# Patient Record
Sex: Female | Born: 1985 | Race: White | Hispanic: No | Marital: Single | Smoking: Never smoker
Health system: Southern US, Community
[De-identification: ages and names within clinical notes are randomized; demographics above are authoritative.]

## PROBLEM LIST (undated history)

## (undated) DIAGNOSIS — F329 Major depressive disorder, single episode, unspecified: Secondary | ICD-10-CM

## (undated) DIAGNOSIS — F32A Depression, unspecified: Secondary | ICD-10-CM

## (undated) DIAGNOSIS — F419 Anxiety disorder, unspecified: Secondary | ICD-10-CM

## (undated) HISTORY — DX: Depression, unspecified: F32.A

## (undated) HISTORY — DX: Anxiety disorder, unspecified: F41.9

## (undated) HISTORY — PX: CHEST SURGERY: SHX595

---
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
## 2016-01-31 HISTORY — PX: CHEST SURGERY: SHX595

## 2018-01-07 DIAGNOSIS — F640 Transsexualism: Secondary | ICD-10-CM | POA: Diagnosis not present

## 2018-01-14 DIAGNOSIS — F640 Transsexualism: Secondary | ICD-10-CM | POA: Diagnosis not present

## 2018-01-15 DIAGNOSIS — F640 Transsexualism: Secondary | ICD-10-CM | POA: Diagnosis not present

## 2018-01-17 DIAGNOSIS — R102 Pelvic and perineal pain: Secondary | ICD-10-CM | POA: Diagnosis not present

## 2018-01-17 DIAGNOSIS — F329 Major depressive disorder, single episode, unspecified: Secondary | ICD-10-CM | POA: Diagnosis not present

## 2018-01-21 DIAGNOSIS — F640 Transsexualism: Secondary | ICD-10-CM | POA: Diagnosis not present

## 2018-01-30 DIAGNOSIS — R238 Other skin changes: Secondary | ICD-10-CM

## 2018-01-30 HISTORY — DX: Other skin changes: R23.8

## 2018-02-04 DIAGNOSIS — F640 Transsexualism: Secondary | ICD-10-CM | POA: Diagnosis not present

## 2018-02-18 DIAGNOSIS — F640 Transsexualism: Secondary | ICD-10-CM | POA: Diagnosis not present

## 2018-02-20 DIAGNOSIS — R109 Unspecified abdominal pain: Secondary | ICD-10-CM | POA: Diagnosis not present

## 2018-03-04 DIAGNOSIS — F640 Transsexualism: Secondary | ICD-10-CM | POA: Diagnosis not present

## 2018-03-04 DIAGNOSIS — R109 Unspecified abdominal pain: Secondary | ICD-10-CM | POA: Diagnosis not present

## 2018-03-11 DIAGNOSIS — J329 Chronic sinusitis, unspecified: Secondary | ICD-10-CM | POA: Diagnosis not present

## 2018-03-18 DIAGNOSIS — F640 Transsexualism: Secondary | ICD-10-CM | POA: Diagnosis not present

## 2018-04-01 DIAGNOSIS — F640 Transsexualism: Secondary | ICD-10-CM | POA: Diagnosis not present

## 2018-04-04 DIAGNOSIS — F341 Dysthymic disorder: Secondary | ICD-10-CM | POA: Diagnosis not present

## 2018-04-15 DIAGNOSIS — F640 Transsexualism: Secondary | ICD-10-CM | POA: Diagnosis not present

## 2018-04-29 DIAGNOSIS — F640 Transsexualism: Secondary | ICD-10-CM | POA: Diagnosis not present

## 2018-05-07 DIAGNOSIS — E559 Vitamin D deficiency, unspecified: Secondary | ICD-10-CM | POA: Diagnosis not present

## 2018-05-07 DIAGNOSIS — F341 Dysthymic disorder: Secondary | ICD-10-CM | POA: Diagnosis not present

## 2018-06-17 DIAGNOSIS — F640 Transsexualism: Secondary | ICD-10-CM | POA: Diagnosis not present

## 2018-07-01 DIAGNOSIS — F640 Transsexualism: Secondary | ICD-10-CM | POA: Diagnosis not present

## 2018-07-15 DIAGNOSIS — F640 Transsexualism: Secondary | ICD-10-CM | POA: Diagnosis not present

## 2018-07-17 DIAGNOSIS — E559 Vitamin D deficiency, unspecified: Secondary | ICD-10-CM | POA: Diagnosis not present

## 2018-07-17 DIAGNOSIS — F640 Transsexualism: Secondary | ICD-10-CM | POA: Diagnosis not present

## 2018-07-22 DIAGNOSIS — F640 Transsexualism: Secondary | ICD-10-CM | POA: Diagnosis not present

## 2018-07-22 DIAGNOSIS — E559 Vitamin D deficiency, unspecified: Secondary | ICD-10-CM | POA: Diagnosis not present

## 2018-07-29 DIAGNOSIS — F640 Transsexualism: Secondary | ICD-10-CM | POA: Diagnosis not present

## 2018-08-12 DIAGNOSIS — F640 Transsexualism: Secondary | ICD-10-CM | POA: Diagnosis not present

## 2018-08-16 DIAGNOSIS — F341 Dysthymic disorder: Secondary | ICD-10-CM | POA: Diagnosis not present

## 2018-08-16 DIAGNOSIS — E559 Vitamin D deficiency, unspecified: Secondary | ICD-10-CM | POA: Diagnosis not present

## 2018-08-26 DIAGNOSIS — F640 Transsexualism: Secondary | ICD-10-CM | POA: Diagnosis not present

## 2018-09-09 DIAGNOSIS — F640 Transsexualism: Secondary | ICD-10-CM | POA: Diagnosis not present

## 2018-09-09 DIAGNOSIS — Z1159 Encounter for screening for other viral diseases: Secondary | ICD-10-CM | POA: Diagnosis not present

## 2018-09-09 DIAGNOSIS — F329 Major depressive disorder, single episode, unspecified: Secondary | ICD-10-CM | POA: Diagnosis not present

## 2018-09-09 DIAGNOSIS — Z114 Encounter for screening for human immunodeficiency virus [HIV]: Secondary | ICD-10-CM | POA: Diagnosis not present

## 2018-09-23 DIAGNOSIS — F640 Transsexualism: Secondary | ICD-10-CM | POA: Diagnosis not present

## 2018-10-08 DIAGNOSIS — F640 Transsexualism: Secondary | ICD-10-CM | POA: Diagnosis not present

## 2019-06-11 ENCOUNTER — Other Ambulatory Visit: Payer: Self-pay

## 2019-06-11 ENCOUNTER — Encounter: Payer: Self-pay | Admitting: Osteopathic Medicine

## 2019-06-11 ENCOUNTER — Ambulatory Visit: Payer: BC Managed Care – PPO | Admitting: Osteopathic Medicine

## 2019-06-11 VITALS — BP 125/85 | HR 84 | Temp 98.7°F | Ht 65.0 in | Wt 208.1 lb

## 2019-06-11 DIAGNOSIS — Z23 Encounter for immunization: Secondary | ICD-10-CM | POA: Diagnosis not present

## 2019-06-11 DIAGNOSIS — F32A Depression, unspecified: Secondary | ICD-10-CM

## 2019-06-11 DIAGNOSIS — F419 Anxiety disorder, unspecified: Secondary | ICD-10-CM | POA: Diagnosis not present

## 2019-06-11 DIAGNOSIS — F640 Transsexualism: Secondary | ICD-10-CM | POA: Diagnosis not present

## 2019-06-11 DIAGNOSIS — F329 Major depressive disorder, single episode, unspecified: Secondary | ICD-10-CM

## 2019-06-11 DIAGNOSIS — IMO0001 Reserved for inherently not codable concepts without codable children: Secondary | ICD-10-CM

## 2019-06-11 DIAGNOSIS — Z79899 Other long term (current) drug therapy: Secondary | ICD-10-CM | POA: Diagnosis not present

## 2019-06-11 MED ORDER — TESTOSTERONE CYPIONATE 100 MG/ML IM SOLN: 100.0000 mg | INTRAMUSCULAR | 0 refills | Status: DC

## 2019-06-11 MED ORDER — TESTOSTERONE CYPIONATE 200 MG/ML IM SOLN
100.0000 mg | Freq: Once | INTRAMUSCULAR | Status: AC
  Administered 2019-06-11: 100 mg via INTRAMUSCULAR

## 2019-06-11 MED ORDER — SERTRALINE HCL 100 MG PO TABS: 100.0000 mg | ORAL_TABLET | Freq: Every day | ORAL | 0 refills | Status: DC

## 2019-06-11 NOTE — Progress Notes (Signed)
Victoria Hill is a 34 y.o. adult who presents to  [place] at [facility]  today, 06/11/19, seeking care for the following: . Establish care  . Transgender hormone therapy - see scanned docs for previous care  . Mental health - has been on fluoxetine 6-7 years, feels not as effective anymore. Would like to change. 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 A diagnosis of Need for Tdap vaccination was also pertinent to this visit.      Patient Instructions  Can arrange weekly or every other week testosterone injections here  Weekly - let's start w/ 100 mg per week Recheck labs in 3 months or so - monitor cholesterol, blood counts   Let's try switching fluoxetine to sertraline aka Zoloft  STOP fluoxetine On the following 2 days, can take 1/2 tablet sertraline (50 mg) THEN increase to whole tablet sertraline (100 mg)       Orders Placed This Encounter  Procedures  . Tdap vaccine greater than or equal to 7yo IM    Meds ordered this encounter  Medications  . testosterone cypionate (DEPOTESTOTERONE CYPIONATE) 100 MG/ML injection    Sig: Inject 1 mL (100 mg total) into the muscle once a week. For IM use only    Dispense:  10 mL    Refill:  0  . sertraline (ZOLOFT) 100 MG tablet    Sig: Take 1 tablet (100 mg total) by mouth daily.    Dispense:  90 tablet    Refill:  0  . testosterone cypionate (DEPOTESTOSTERONE CYPIONATE) injection 100 mg       Follow-up instructions: Return in about 3 months (around 09/11/2019) for [place] / TESTOSTERONE .                                         BP 125/85 (BP Location: Left Arm, Patient Position: Sitting, Cuff Size: Normal)   Pulse 84    Temp 98.7 F (37.1 C) (Oral)   Ht 5\' 5"  (1.651 m)   Wt 208 lb 1.9 oz (94.4 kg)   BMI 34.63 kg/m   Current Meds  Medication Sig  . Ascorbic Acid (VITAMIN C PO) Take 100 mg by mouth.  . Multiple Vitamins-Minerals (ZINC PO) Take 50 mg by mouth.  Marland Kitchen VITAMIN D PO Take 50 mg by mouth.  . [DISCONTINUED] FLUoxetine (PROZAC) 40 MG capsule Take 80 mg by mouth daily.    No results found for this or any previous visit (from the past 72 hour(s)).  No results found.  Depression screen PHQ 2/9 06/11/2019  Decreased Interest 1  Down, Depressed, Hopeless 1  PHQ - 2 Score 2  Altered sleeping 1  Tired, decreased energy 1  Change in appetite 1  Feeling bad or failure about yourself  0  Trouble concentrating 1  Moving slowly or fidgety/restless 0  Suicidal thoughts 0  PHQ-9 Score 6  Difficult doing work/chores Somewhat difficult    GAD 7 : Generalized Anxiety Score 06/11/2019  Nervous, Anxious, on Edge 0  Control/stop worrying 0  Worry too much - different things 0  Trouble relaxing 1  Restless 0  Easily annoyed or  irritable 0  Afraid - awful might happen 0  Total GAD 7 Score 1  Anxiety Difficulty Not difficult at all      All questions at time of visit were answered - patient instructed to contact office with any additional concerns or updates.  ER/RTC precautions were reviewed with the patient.  Please note: voice recognition software was used to produce this document, and typos may escape review. Please contact Dr. Sheppard Coil for any needed clarifications.   Total encounter time: 45 minutes.

## 2019-06-11 NOTE — Patient Instructions (Addendum)
Can arrange weekly or every other week testosterone injections here  Weekly - let's start w/ 100 mg per week Recheck labs in 3 months or so - monitor cholesterol, blood counts   Let's try switching fluoxetine to sertraline aka Zoloft  STOP fluoxetine On the following 2 days, can take 1/2 tablet sertraline (50 mg) THEN increase to whole tablet sertraline (100 mg)

## 2019-06-16 DIAGNOSIS — H52201 Unspecified astigmatism, right eye: Secondary | ICD-10-CM | POA: Diagnosis not present

## 2019-06-16 DIAGNOSIS — H0288A Meibomian gland dysfunction right eye, upper and lower eyelids: Secondary | ICD-10-CM | POA: Diagnosis not present

## 2019-06-16 DIAGNOSIS — H5212 Myopia, left eye: Secondary | ICD-10-CM | POA: Diagnosis not present

## 2019-06-16 DIAGNOSIS — H35412 Lattice degeneration of retina, left eye: Secondary | ICD-10-CM | POA: Diagnosis not present

## 2019-06-16 DIAGNOSIS — H0288B Meibomian gland dysfunction left eye, upper and lower eyelids: Secondary | ICD-10-CM | POA: Diagnosis not present

## 2019-06-16 DIAGNOSIS — Q120 Congenital cataract: Secondary | ICD-10-CM | POA: Diagnosis not present

## 2019-06-16 DIAGNOSIS — H5211 Myopia, right eye: Secondary | ICD-10-CM | POA: Diagnosis not present

## 2019-06-17 ENCOUNTER — Ambulatory Visit: Payer: BC Managed Care – PPO

## 2019-06-19 ENCOUNTER — Ambulatory Visit (INDEPENDENT_AMBULATORY_CARE_PROVIDER_SITE_OTHER): Payer: BC Managed Care – PPO | Admitting: Osteopathic Medicine

## 2019-06-19 VITALS — BP 116/68 | HR 86

## 2019-06-19 DIAGNOSIS — F640 Transsexualism: Secondary | ICD-10-CM

## 2019-06-19 DIAGNOSIS — IMO0001 Reserved for inherently not codable concepts without codable children: Secondary | ICD-10-CM

## 2019-06-19 DIAGNOSIS — Z79899 Other long term (current) drug therapy: Secondary | ICD-10-CM

## 2019-06-19 MED ORDER — TESTOSTERONE CYPIONATE 200 MG/ML IM SOLN
100.0000 mg | Freq: Once | INTRAMUSCULAR | Status: AC
  Administered 2019-06-19: 100 mg via INTRAMUSCULAR

## 2019-06-19 NOTE — Progress Notes (Signed)
Patient is here for testosterone injection. Denies chest pain, shortness of breath, headaches, and problems with medication or mood changes.   Patient tolerated testosterone 100 mg injection to RUOQ well without complications. Patient advised to schedule next injection in 7 days.

## 2019-06-27 ENCOUNTER — Ambulatory Visit (INDEPENDENT_AMBULATORY_CARE_PROVIDER_SITE_OTHER): Payer: BC Managed Care – PPO | Admitting: Osteopathic Medicine

## 2019-06-27 DIAGNOSIS — Z79899 Other long term (current) drug therapy: Secondary | ICD-10-CM

## 2019-06-27 DIAGNOSIS — F640 Transsexualism: Secondary | ICD-10-CM | POA: Diagnosis not present

## 2019-06-27 DIAGNOSIS — IMO0001 Reserved for inherently not codable concepts without codable children: Secondary | ICD-10-CM

## 2019-06-27 MED ORDER — TESTOSTERONE CYPIONATE 200 MG/ML IM SOLN
100.0000 mg | Freq: Once | INTRAMUSCULAR | Status: AC
  Administered 2019-06-27: 100 mg via INTRAMUSCULAR

## 2019-06-27 NOTE — Progress Notes (Signed)
   Subjective:    Patient ID: Victoria Hill, adult    DOB: 03-10-1985, 34 y.o.   MRN: [id number]  HPI Patient is here for a testosterone injection. Denies chest pain, shortness of breath, headaches, problems with mood change, or medication problems.    Review of Systems     Objective:   Physical Exam        Assessment & Plan:  Patient tolerated injection in LUOQ well without complications. Patient advised to schedule his next injection in 1 week.  HM: Patient has requested PAP be removed permanently from health maintenance. I believe I did this, but can you please verify for me?

## 2019-07-03 ENCOUNTER — Ambulatory Visit (INDEPENDENT_AMBULATORY_CARE_PROVIDER_SITE_OTHER): Payer: BC Managed Care – PPO | Admitting: Osteopathic Medicine

## 2019-07-03 ENCOUNTER — Other Ambulatory Visit: Payer: Self-pay

## 2019-07-03 VITALS — BP 116/69 | HR 80

## 2019-07-03 DIAGNOSIS — IMO0001 Reserved for inherently not codable concepts without codable children: Secondary | ICD-10-CM

## 2019-07-03 DIAGNOSIS — Z79899 Other long term (current) drug therapy: Secondary | ICD-10-CM | POA: Diagnosis not present

## 2019-07-03 DIAGNOSIS — F640 Transsexualism: Secondary | ICD-10-CM

## 2019-07-03 MED ORDER — TESTOSTERONE CYPIONATE 200 MG/ML IM SOLN
100.0000 mg | Freq: Once | INTRAMUSCULAR | Status: AC
  Administered 2019-07-03: 100 mg via INTRAMUSCULAR

## 2019-07-03 NOTE — Progress Notes (Signed)
Patient is here for testosterone injection. Denies chest pain, shortness of breath, headaches, and problems with medication or mood changes.   Patient tolerated testosterone 100 mg injection to RUOQ well without complications. Patient advised to schedule next injection in 7 days.

## 2019-07-10 ENCOUNTER — Other Ambulatory Visit: Payer: Self-pay

## 2019-07-10 ENCOUNTER — Ambulatory Visit (INDEPENDENT_AMBULATORY_CARE_PROVIDER_SITE_OTHER): Payer: BC Managed Care – PPO | Admitting: Family Medicine

## 2019-07-10 VITALS — BP 120/72 | HR 71 | Temp 98.0°F | Wt 214.0 lb

## 2019-07-10 DIAGNOSIS — Z79899 Other long term (current) drug therapy: Secondary | ICD-10-CM

## 2019-07-10 DIAGNOSIS — IMO0001 Reserved for inherently not codable concepts without codable children: Secondary | ICD-10-CM

## 2019-07-10 DIAGNOSIS — F640 Transsexualism: Secondary | ICD-10-CM | POA: Diagnosis not present

## 2019-07-10 MED ORDER — TESTOSTERONE CYPIONATE 200 MG/ML IM SOLN
100.0000 mg | INTRAMUSCULAR | Status: DC
  Administered 2019-07-10 – 2019-07-18 (×2): 100 mg via INTRAMUSCULAR

## 2019-07-10 NOTE — Progress Notes (Signed)
Pt here for testosterone injection no SOB,CP or mood swings. Injection tolerated well given in LUOQ. Pt to RTC in 1 week for next injection.

## 2019-07-10 NOTE — Progress Notes (Signed)
Medical screening examination/treatment was performed by qualified clinical staff member and as supervising physician I was immediately available for consultation/collaboration. I have reviewed documentation and agree with assessment and plan.  Arma Reining, DO  

## 2019-07-18 ENCOUNTER — Other Ambulatory Visit: Payer: Self-pay

## 2019-07-18 ENCOUNTER — Ambulatory Visit (INDEPENDENT_AMBULATORY_CARE_PROVIDER_SITE_OTHER): Payer: BC Managed Care – PPO | Admitting: Osteopathic Medicine

## 2019-07-18 VITALS — BP 109/56 | HR 71 | Wt 214.0 lb

## 2019-07-18 DIAGNOSIS — Z79899 Other long term (current) drug therapy: Secondary | ICD-10-CM

## 2019-07-18 DIAGNOSIS — F640 Transsexualism: Secondary | ICD-10-CM

## 2019-07-18 DIAGNOSIS — IMO0001 Reserved for inherently not codable concepts without codable children: Secondary | ICD-10-CM

## 2019-07-18 MED ORDER — TESTOSTERONE CYPIONATE 200 MG/ML IM SOLN: 100.0000 mg | Freq: Once | INTRAMUSCULAR | Status: DC

## 2019-07-18 NOTE — Progress Notes (Signed)
Pt here for testosterone injection no SOB,CP or mood swings. Injection tolerated well given in RUOQ. Pt to RTC in 1 week for next injection.

## 2019-07-24 ENCOUNTER — Ambulatory Visit (INDEPENDENT_AMBULATORY_CARE_PROVIDER_SITE_OTHER): Payer: BC Managed Care – PPO | Admitting: Osteopathic Medicine

## 2019-07-24 VITALS — BP 123/70 | HR 81 | Wt 215.0 lb

## 2019-07-24 DIAGNOSIS — Z79899 Other long term (current) drug therapy: Secondary | ICD-10-CM | POA: Diagnosis not present

## 2019-07-24 DIAGNOSIS — IMO0001 Reserved for inherently not codable concepts without codable children: Secondary | ICD-10-CM

## 2019-07-24 DIAGNOSIS — F640 Transsexualism: Secondary | ICD-10-CM

## 2019-07-24 MED ORDER — TESTOSTERONE CYPIONATE 200 MG/ML IM SOLN: 100.0000 mg | Freq: Once | INTRAMUSCULAR | 0 refills | Status: DC

## 2019-07-24 MED ORDER — TESTOSTERONE CYPIONATE 200 MG/ML IM SOLN
100.0000 mg | Freq: Once | INTRAMUSCULAR | Status: AC
Start: 2019-07-24 — End: 2019-07-24
  Administered 2019-07-24: 100 mg via INTRAMUSCULAR

## 2019-07-24 NOTE — Progress Notes (Signed)
Established Patient Office Visit  Subjective:  Patient ID: Victoria Hill, adult    DOB: 18-Jul-1985  Age: 34 y.o. MRN: [id number]  CC:  Chief Complaint  Patient presents with  . Hypogonadism    HPI Zeidy Tayag is here for a testosterone injection. Denies chest pain, shortness of breath, headaches or mood changes.   Past Medical History:  Diagnosis Date  . Anxiety   . Depression     Past Surgical History:  Procedure Laterality Date  . CHEST SURGERY      History reviewed. No pertinent family history.  Social History   Socioeconomic History  . Marital status: Soil scientist    Spouse name: Not on file  . Number of children: Not on file  . Years of education: Not on file  . Highest education level: Not on file  Occupational History  . Not on file  Tobacco Use  . Smoking status: Never Smoker  . Smokeless tobacco: Never Used  Vaping Use  . Vaping Use: Never used  Substance and Sexual Activity  . Alcohol use: Not Currently  . Drug use: Never  . Sexual activity: Never    Partners: Female  Other Topics Concern  . Not on file  Social History Narrative  . Not on file   Social Determinants of Health   Financial Resource Strain:   . Difficulty of Paying Living Expenses:   Food Insecurity:   . Worried About Charity fundraiser in the Last Year:   . Arboriculturist in the Last Year:   Transportation Needs:   . Film/video editor (Medical):   Marland Kitchen Lack of Transportation (Non-Medical):   Physical Activity:   . Days of Exercise per Week:   . Minutes of Exercise per Session:   Stress:   . Feeling of Stress :   Social Connections:   . Frequency of Communication with Friends and Family:   . Frequency of Social Gatherings with Friends and Family:   . Attends Religious Services:   . Active Member of Clubs or Organizations:   . Attends Archivist Meetings:   Marland Kitchen Marital Status:   Intimate Partner Violence:   . Fear of Current or Ex-Partner:   . Emotionally  Abused:   Marland Kitchen Physically Abused:   . Sexually Abused:     Outpatient Medications Prior to Visit  Medication Sig Dispense Refill  . Ascorbic Acid (VITAMIN C PO) Take 100 mg by mouth.    . Multiple Vitamins-Minerals (ZINC PO) Take 50 mg by mouth.    . sertraline (ZOLOFT) 100 MG tablet Take 1 tablet (100 mg total) by mouth daily. 90 tablet 0  . testosterone cypionate (DEPOTESTOTERONE CYPIONATE) 100 MG/ML injection Inject 1 mL (100 mg total) into the muscle once a week. For IM use only 10 mL 0  . VITAMIN D PO Take 50 mg by mouth.     Facility-Administered Medications Prior to Visit  Medication Dose Route Frequency Provider Last Rate Last Admin  . testosterone cypionate (DEPOTESTOSTERONE CYPIONATE) injection 100 mg  100 mg Intramuscular Weekly Luetta Nutting, DO   100 mg at 07/18/19 1048    No Known Allergies  ROS Review of Systems    Objective:    Physical Exam  BP 123/70   Pulse 81   Wt 215 lb (97.5 kg)   SpO2 100%   BMI 35.78 kg/m  Wt Readings from Last 3 Encounters:  07/24/19 215 lb (97.5 kg)  07/18/19 214 lb (97.1 kg)  07/10/19 214 lb (97.1 kg)     Health Maintenance Due  Topic Date Due  . Hepatitis C Screening  Never done  . HIV Screening  Never done    There are no preventive care reminders to display for this patient.  No results found for: TSH No results found for: WBC, HGB, HCT, MCV, PLT No results found for: NA, K, CHLORIDE, CO2, GLUCOSE, BUN, CREATININE, BILITOT, ALKPHOS, AST, ALT, PROT, ALBUMIN, CALCIUM, ANIONGAP, EGFR, GFR No results found for: CHOL No results found for: HDL No results found for: LDLCALC No results found for: TRIG No results found for: CHOLHDL No results found for: HGBA1C    Assessment & Plan:  Testosterone - Patient tolerated injection well without complications. Patient advised to schedule next injection 7 days from today.    Problem List Items Addressed This Visit    None    Visit Diagnoses    Female to female transsexual  person on hormone therapy    -  Primary   Relevant Medications   testosterone cypionate (DEPOTESTOSTERONE CYPIONATE) 200 MG/ML injection   testosterone cypionate (DEPOTESTOSTERONE CYPIONATE) injection 100 mg (Completed)      Meds ordered this encounter  Medications  . testosterone cypionate (DEPOTESTOSTERONE CYPIONATE) 200 MG/ML injection    Sig: Inject 0.5 mLs (100 mg total) into the muscle once for 1 dose.    Dispense:  10 mL    Refill:  0  . testosterone cypionate (DEPOTESTOSTERONE CYPIONATE) injection 100 mg    Follow-up: Return in about 1 week (around 07/31/2019) for testosteron injection.[name], [name], [place]

## 2019-07-31 ENCOUNTER — Other Ambulatory Visit: Payer: Self-pay

## 2019-07-31 ENCOUNTER — Ambulatory Visit (INDEPENDENT_AMBULATORY_CARE_PROVIDER_SITE_OTHER): Payer: BC Managed Care – PPO | Admitting: Osteopathic Medicine

## 2019-07-31 VITALS — BP 132/83 | HR 79

## 2019-07-31 DIAGNOSIS — F640 Transsexualism: Secondary | ICD-10-CM | POA: Diagnosis not present

## 2019-07-31 DIAGNOSIS — IMO0001 Reserved for inherently not codable concepts without codable children: Secondary | ICD-10-CM

## 2019-07-31 DIAGNOSIS — Z79899 Other long term (current) drug therapy: Secondary | ICD-10-CM

## 2019-07-31 MED ORDER — TESTOSTERONE CYPIONATE 200 MG/ML IM SOLN
100.0000 mg | Freq: Once | INTRAMUSCULAR | Status: AC
  Administered 2019-07-31: 100 mg via INTRAMUSCULAR

## 2019-07-31 NOTE — Progress Notes (Signed)
Patient is here for testosterone injection. Denies chest pain, shortness of breath, headaches, and problems with medication or mood changes.   Patient tolerated testosterone 100 mg injection to RUOQ well without complications. Patient advised to schedule next injection in 7 days.

## 2019-08-07 ENCOUNTER — Other Ambulatory Visit: Payer: Self-pay

## 2019-08-07 ENCOUNTER — Ambulatory Visit (INDEPENDENT_AMBULATORY_CARE_PROVIDER_SITE_OTHER): Payer: BC Managed Care – PPO | Admitting: Osteopathic Medicine

## 2019-08-07 VITALS — BP 130/77 | HR 80

## 2019-08-07 DIAGNOSIS — Z79899 Other long term (current) drug therapy: Secondary | ICD-10-CM | POA: Diagnosis not present

## 2019-08-07 DIAGNOSIS — IMO0001 Reserved for inherently not codable concepts without codable children: Secondary | ICD-10-CM

## 2019-08-07 DIAGNOSIS — F640 Transsexualism: Secondary | ICD-10-CM

## 2019-08-07 MED ORDER — TESTOSTERONE CYPIONATE 200 MG/ML IM SOLN
100.0000 mg | Freq: Once | INTRAMUSCULAR | Status: AC
  Administered 2019-08-07: 100 mg via INTRAMUSCULAR

## 2019-08-07 NOTE — Progress Notes (Signed)
Patient is here for testosterone injection. Denies chest pain, shortness of breath, headaches, and problems with medication or mood changes.   Patient tolerated testosterone 100 mg injection to RUOQ well without complications. Patient advised to schedule next injection in 1 week.

## 2019-08-14 ENCOUNTER — Ambulatory Visit (INDEPENDENT_AMBULATORY_CARE_PROVIDER_SITE_OTHER): Payer: BC Managed Care – PPO | Admitting: Medical-Surgical

## 2019-08-14 VITALS — BP 118/59 | HR 75 | Temp 97.8°F | Wt 215.8 lb

## 2019-08-14 DIAGNOSIS — Z79899 Other long term (current) drug therapy: Secondary | ICD-10-CM

## 2019-08-14 DIAGNOSIS — F640 Transsexualism: Secondary | ICD-10-CM | POA: Diagnosis not present

## 2019-08-14 DIAGNOSIS — IMO0001 Reserved for inherently not codable concepts without codable children: Secondary | ICD-10-CM

## 2019-08-14 MED ORDER — TESTOSTERONE CYPIONATE 200 MG/ML IM SOLN
100.0000 mg | INTRAMUSCULAR | Status: AC
  Administered 2019-08-14: 100 mg via INTRAMUSCULAR

## 2019-08-14 NOTE — Progress Notes (Signed)
Patient is here for a testosterone injection of 151ml.  Given in [place]  Denies chest pain, shortn   s of breath, headaches and problems with medication or mood changes.  Tolerated injection well without complications.   Patient advised to schedule next injection in 7 days.

## 2019-08-21 ENCOUNTER — Other Ambulatory Visit: Payer: Self-pay

## 2019-08-21 ENCOUNTER — Ambulatory Visit (INDEPENDENT_AMBULATORY_CARE_PROVIDER_SITE_OTHER): Payer: BC Managed Care – PPO | Admitting: Osteopathic Medicine

## 2019-08-21 VITALS — BP 105/47 | HR 84

## 2019-08-21 DIAGNOSIS — IMO0001 Reserved for inherently not codable concepts without codable children: Secondary | ICD-10-CM

## 2019-08-21 DIAGNOSIS — F640 Transsexualism: Secondary | ICD-10-CM

## 2019-08-21 DIAGNOSIS — Z79899 Other long term (current) drug therapy: Secondary | ICD-10-CM | POA: Diagnosis not present

## 2019-08-21 MED ORDER — TESTOSTERONE CYPIONATE 200 MG/ML IM SOLN
100.0000 mg | Freq: Once | INTRAMUSCULAR | Status: AC
  Administered 2019-08-21: 100 mg via INTRAMUSCULAR

## 2019-08-21 NOTE — Progress Notes (Signed)
Patient is here for testosterone injection. Denies chest pain, shortness of breath, headaches, and problems with medication or mood changes.   Patient tolerated testosterone 100 mg injection to RUOQ well without complications. Patient advised to schedule next injection in 7 days.

## 2019-08-28 ENCOUNTER — Ambulatory Visit (INDEPENDENT_AMBULATORY_CARE_PROVIDER_SITE_OTHER): Payer: BC Managed Care – PPO | Admitting: Osteopathic Medicine

## 2019-08-28 VITALS — BP 114/70 | HR 70 | Temp 97.9°F | Wt 218.0 lb

## 2019-08-28 DIAGNOSIS — F640 Transsexualism: Secondary | ICD-10-CM

## 2019-08-28 DIAGNOSIS — IMO0001 Reserved for inherently not codable concepts without codable children: Secondary | ICD-10-CM

## 2019-08-28 DIAGNOSIS — Z79899 Other long term (current) drug therapy: Secondary | ICD-10-CM | POA: Diagnosis not present

## 2019-08-28 MED ORDER — TESTOSTERONE CYPIONATE 200 MG/ML IM SOLN
100.0000 mg | Freq: Once | INTRAMUSCULAR | Status: AC
  Administered 2019-08-28: 100 mg via INTRAMUSCULAR

## 2019-08-28 NOTE — Progress Notes (Signed)
Pt is here for a testosterone injection. Denies chest pains, shortness of breath, headaches and problems with medications or mood changes. Pt tolerated 100 mg injection well on LUOQ without any complications. Pt advised to schedule next injection in 7 days.

## 2019-08-31 ENCOUNTER — Other Ambulatory Visit: Payer: Self-pay | Admitting: Osteopathic Medicine

## 2019-08-31 DIAGNOSIS — F329 Major depressive disorder, single episode, unspecified: Secondary | ICD-10-CM

## 2019-08-31 DIAGNOSIS — F419 Anxiety disorder, unspecified: Secondary | ICD-10-CM

## 2019-08-31 DIAGNOSIS — F32A Depression, unspecified: Secondary | ICD-10-CM

## 2019-09-01 MED ORDER — SERTRALINE HCL 100 MG PO TABS: 100.0000 mg | ORAL_TABLET | Freq: Every day | ORAL | 0 refills | Status: DC

## 2019-09-01 NOTE — Telephone Encounter (Signed)
Last Ov- 06/11/2019  Last refill 06/11/2019

## 2019-09-04 ENCOUNTER — Ambulatory Visit: Payer: BC Managed Care – PPO

## 2019-09-04 ENCOUNTER — Other Ambulatory Visit: Payer: Self-pay

## 2019-09-04 ENCOUNTER — Ambulatory Visit (INDEPENDENT_AMBULATORY_CARE_PROVIDER_SITE_OTHER): Payer: BC Managed Care – PPO | Admitting: Osteopathic Medicine

## 2019-09-04 VITALS — BP 113/54 | HR 72

## 2019-09-04 DIAGNOSIS — Z79899 Other long term (current) drug therapy: Secondary | ICD-10-CM | POA: Diagnosis not present

## 2019-09-04 DIAGNOSIS — F640 Transsexualism: Secondary | ICD-10-CM

## 2019-09-04 DIAGNOSIS — IMO0001 Reserved for inherently not codable concepts without codable children: Secondary | ICD-10-CM

## 2019-09-04 MED ORDER — TESTOSTERONE CYPIONATE 200 MG/ML IM SOLN
100.0000 mg | Freq: Once | INTRAMUSCULAR | Status: AC
  Administered 2019-09-04: 100 mg via INTRAMUSCULAR

## 2019-09-04 NOTE — Progress Notes (Signed)
Patient is here for testosterone injection. Denies chest pain, shortness of breath, headaches, and problems with medication or mood changes.   Patient tolerated testosterone 100 mg injection to RUOQ well without complications. Patient advised to schedule next injection in 7 days.

## 2019-09-11 ENCOUNTER — Other Ambulatory Visit: Payer: Self-pay

## 2019-09-11 ENCOUNTER — Encounter: Payer: Self-pay | Admitting: Osteopathic Medicine

## 2019-09-11 ENCOUNTER — Ambulatory Visit (INDEPENDENT_AMBULATORY_CARE_PROVIDER_SITE_OTHER): Payer: BC Managed Care – PPO | Admitting: Osteopathic Medicine

## 2019-09-11 VITALS — BP 101/73 | HR 75 | Wt 219.0 lb

## 2019-09-11 DIAGNOSIS — F640 Transsexualism: Secondary | ICD-10-CM | POA: Insufficient documentation

## 2019-09-11 DIAGNOSIS — IMO0001 Reserved for inherently not codable concepts without codable children: Secondary | ICD-10-CM

## 2019-09-11 DIAGNOSIS — Z79899 Other long term (current) drug therapy: Secondary | ICD-10-CM | POA: Diagnosis not present

## 2019-09-11 DIAGNOSIS — Z789 Other specified health status: Secondary | ICD-10-CM | POA: Insufficient documentation

## 2019-09-11 LAB — COMPLETE METABOLIC PANEL WITH GFR
AG Ratio: 1.9 (calc) (ref 1.0–2.5)
ALT: 21 U/L (ref 6–29)
AST: 15 U/L (ref 10–30)
Albumin: 4.4 g/dL (ref 3.6–5.1)
Alkaline phosphatase (APISO): 46 U/L (ref 31–125)
BUN: 9 mg/dL (ref 7–25)
CO2: 33 mmol/L — ABNORMAL HIGH (ref 20–32)
Calcium: 9.2 mg/dL (ref 8.6–10.2)
Chloride: 103 mmol/L (ref 98–110)
Creat: 0.86 mg/dL (ref 0.50–1.10)
GFR, Est African American: 101 mL/min/{1.73_m2} (ref >=60)
GFR, Est Non African American: 87 mL/min/{1.73_m2} (ref >=60)
Globulin: 2.3 g/dL (calc) (ref 1.9–3.7)
Glucose, Bld: 87 mg/dL (ref 65–99)
Potassium: 4.1 mmol/L (ref 3.5–5.3)
Sodium: 142 mmol/L (ref 135–146)
Total Bilirubin: 0.8 mg/dL (ref 0.2–1.2)
Total Protein: 6.7 g/dL (ref 6.1–8.1)

## 2019-09-11 LAB — CBC
HCT: 44.6 % (ref 35.0–45.0)
Hemoglobin: 15 g/dL (ref 11.7–15.5)
MCH: 29.7 pg (ref 27.0–33.0)
MCHC: 33.6 g/dL (ref 32.0–36.0)
MCV: 88.3 fL (ref 80.0–100.0)
MPV: 10.5 fL (ref 7.5–12.5)
Platelets: 350 10*3/uL (ref 140–400)
RBC: 5.05 10*6/uL (ref 3.80–5.10)
RDW: 12.5 % (ref 11.0–15.0)
WBC: 6.1 10*3/uL (ref 3.8–10.8)

## 2019-09-11 MED ORDER — TESTOSTERONE CYPIONATE 200 MG/ML IM SOLN: 100.0000 mg | Freq: Once | INTRAMUSCULAR | Status: DC

## 2019-09-11 MED ORDER — BUPROPION HCL ER (XL) 150 MG PO TB24
150.0000 mg | ORAL_TABLET | ORAL | 0 refills | Status: DC
Start: 2019-09-11 — End: 2019-12-15

## 2019-09-11 MED ORDER — SERTRALINE HCL 50 MG PO TABS
ORAL_TABLET | ORAL | 0 refills | Status: DC
Start: 2019-09-11 — End: 2019-10-15

## 2019-09-11 NOTE — Patient Instructions (Signed)
Plan: Will start Wellbutrin/buproprion now / tomorrow morning Taper off the Zoloft/sertaline as directed  If Wellbutrin working well, no problems! If not or if side effects, will transition to Trintellix or Viibryd but these may require an authorization process

## 2019-09-11 NOTE — Progress Notes (Signed)
Patient is here for testosterone injection. Denies chest pain, shortness of breath, headaches, and problems with medication or mood changes.   Patient tolerated testosterone 100 mg injection to LUOQ well without complications. Patient advised to schedule next injection in 7 days.   

## 2019-09-11 NOTE — Progress Notes (Signed)
Victoria Hill is a 34 y.o. adult who presents to  [place] at [facility]  today, 09/11/19, seeking care for the following:  . Recheck mental health -previous visit we switched from fluoxetine to sertraline.  Sertraline seems to be working fairly well but he has noticed some concentration issues/short-term memory problems. . Due for T injection -happy with current dose of testosterone, feeling a lot better on this medication    Depression screen [facility] 2/9 09/11/2019 06/11/2019  Decreased Interest 0 1  Down, Depressed, Hopeless 1 1  PHQ - 2 Score 1 2  Altered sleeping - 1  Tired, decreased energy - 1  Change in appetite - 1  Feeling bad or failure about yourself  - 0  Trouble concentrating - 1  Moving slowly or fidgety/restless - 0  Suicidal thoughts - 0  PHQ-9 Score - 6  Difficult doing work/chores - Somewhat difficult   GAD 7 : Generalized Anxiety Score 06/11/2019  Nervous, Anxious, on Edge 0  Control/stop worrying 0  Worry too much - different things 0  Trouble relaxing 1  Restless 0  Easily annoyed or irritable 0  Afraid - awful might happen 0  Total GAD 7 Score 1  Anxiety Difficulty Not difficult at all       ASSESSMENT & PLAN with other pertinent findings:  The primary encounter diagnosis was Transgender. 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   Patient Instructions  Plan: Will start Wellbutrin/buproprion now / tomorrow morning Taper off the Zoloft/sertaline as directed  If Wellbutrin working well, no problems! If not or if side effects, will transition to Trintellix or Viibryd but these may require an authorization process     Orders Placed This Encounter  Procedures  . CBC  . COMPLETE METABOLIC PANEL WITH GFR    Meds ordered this encounter  Medications  . testosterone cypionate (DEPOTESTOSTERONE CYPIONATE) injection 100 mg       Follow-up instructions: Return in about 1 week  (around 09/18/2019) for testosterone shot. See Dr Sheppard Coil in 3 month to recheck mental health .                                         BP 101/73 (BP Location: Right Arm, Patient Position: Sitting)   Pulse 75   Wt 219 lb (99.3 kg)   SpO2 99%   BMI 36.44 kg/m   No outpatient medications have been marked as taking for the 09/11/19 encounter (Office Visit) with Emeterio Reeve, DO.   Current Facility-Administered Medications for the 09/11/19 encounter (Office Visit) with Emeterio Reeve, DO  Medication  . testosterone cypionate (DEPOTESTOSTERONE CYPIONATE) injection 100 mg    No results found for this or any previous visit (from the past 1 hour(s)).  No results found.     All questions at time of visit were answered - patient instructed to contact office with any additional concerns or updates.  ER/RTC precautions were reviewed with the patient as applicable.   Please note: voice recognition software was used to produce this document, and typos may escape review. Please contact Dr. Sheppard Coil for any needed clarifications.

## 2019-09-19 ENCOUNTER — Other Ambulatory Visit: Payer: Self-pay

## 2019-09-19 ENCOUNTER — Ambulatory Visit (INDEPENDENT_AMBULATORY_CARE_PROVIDER_SITE_OTHER): Payer: BC Managed Care – PPO | Admitting: Osteopathic Medicine

## 2019-09-19 VITALS — BP 120/74 | HR 68 | Wt 218.0 lb

## 2019-09-19 DIAGNOSIS — F640 Transsexualism: Secondary | ICD-10-CM

## 2019-09-19 DIAGNOSIS — Z79899 Other long term (current) drug therapy: Secondary | ICD-10-CM | POA: Diagnosis not present

## 2019-09-19 DIAGNOSIS — IMO0001 Reserved for inherently not codable concepts without codable children: Secondary | ICD-10-CM

## 2019-09-19 MED ORDER — TESTOSTERONE CYPIONATE 200 MG/ML IM SOLN
100.0000 mg | Freq: Once | INTRAMUSCULAR | Status: AC
Start: 2019-09-19 — End: 2019-09-19
  Administered 2019-09-19: 100 mg via INTRAMUSCULAR

## 2019-09-19 NOTE — Progress Notes (Signed)
Established Patient Office Visit  Subjective:  Patient ID: Victoria Hill, adult    DOB: [birth year]  Age: 34 y.o. MRN: [id number]  CC:  Chief Complaint  Patient presents with  . Injections    HPI Victoria Hill is here for a testosterone injection. Denies chest pain, shortness of breath, headaches or mood changes.   Past Medical History:  Diagnosis Date  . Anxiety   . Depression     Past Surgical History:  Procedure Laterality Date  . CHEST SURGERY      History reviewed. No pertinent family history.  Social History   Socioeconomic History  . Marital status: Soil scientist    Spouse name: Not on file  . Number of children: Not on file  . Years of education: Not on file  . Highest education level: Not on file  Occupational History  . Not on file  Tobacco Use  . Smoking status: Never Smoker  . Smokeless tobacco: Never Used  Vaping Use  . Vaping Use: Never used  Substance and Sexual Activity  . Alcohol use: Not Currently  . Drug use: Never  . Sexual activity: Never    Partners: Female  Other Topics Concern  . Not on file  Social History Narrative  . Not on file   Social Determinants of Health   Financial Resource Strain:   . Difficulty of Paying Living Expenses: Not on file  Food Insecurity:   . Worried About Charity fundraiser in the Last Year: Not on file  . Ran Out of Food in the Last Year: Not on file  Transportation Needs:   . Lack of Transportation (Medical): Not on file  . Lack of Transportation (Non-Medical): Not on file  Physical Activity:   . Days of Exercise per Week: Not on file  . Minutes of Exercise per Session: Not on file  Stress:   . Feeling of Stress : Not on file  Social Connections:   . Frequency of Communication with Friends and Family: Not on file  . Frequency of Social Gatherings with Friends and Family: Not on file  . Attends Religious Services: Not on file  . Active Member of Clubs or Organizations: Not on file  . Attends English as a second language teacher Meetings: Not on file  . Marital Status: Not on file  Intimate Partner Violence:   . Fear of Current or Ex-Partner: Not on file  . Emotionally Abused: Not on file  . Physically Abused: Not on file  . Sexually Abused: Not on file    Outpatient Medications Prior to Visit  Medication Sig Dispense Refill  . Ascorbic Acid (VITAMIN C PO) Take 100 mg by mouth.    Marland Kitchen buPROPion (WELLBUTRIN XL) 150 MG 24 hr tablet Take 1 tablet (150 mg total) by mouth every morning. 90 tablet 0  . Multiple Vitamins-Minerals (ZINC PO) Take 50 mg by mouth.    . sertraline (ZOLOFT) 50 MG tablet Take 1 tablet (50 mg total) by mouth daily for 5 days, THEN 0.5 tablets (25 mg total) daily for 5 days. 10 tablet 0  . testosterone cypionate (DEPOTESTOTERONE CYPIONATE) 100 MG/ML injection Inject 100 mg into the muscle every 7 (seven) days. For IM use only    . VITAMIN D PO Take 50 mg by mouth.    . testosterone cypionate (DEPOTESTOSTERONE CYPIONATE) injection 100 mg      No facility-administered medications prior to visit.    No Known Allergies  ROS Review of Systems  Objective:    Physical Exam  BP 120/74   Pulse 68   Wt 218 lb (98.9 kg)   SpO2 100%   BMI 36.28 kg/m  Wt Readings from Last 3 Encounters:  09/19/19 218 lb (98.9 kg)  09/11/19 219 lb (99.3 kg)  08/28/19 (!) 218 lb (98.9 kg)     Health Maintenance Due  Topic Date Due  . Hepatitis C Screening  Never done  . HIV Screening  Never done  . INFLUENZA VACCINE  08/31/2019    There are no preventive care reminders to display for this patient.  No results found for: TSH Lab Results  Component Value Date   WBC 6.1 09/11/2019   HGB 15.0 09/11/2019   HCT 44.6 09/11/2019   MCV 88.3 09/11/2019   PLT 350 09/11/2019   Lab Results  Component Value Date   NA 142 09/11/2019   K 4.1 09/11/2019   CO2 33 (H) 09/11/2019   GLUCOSE 87 09/11/2019   BUN 9 09/11/2019   CREATININE 0.86 09/11/2019   BILITOT 0.8 09/11/2019   AST 15  09/11/2019   ALT 21 09/11/2019   PROT 6.7 09/11/2019   CALCIUM 9.2 09/11/2019   No results found for: CHOL No results found for: HDL No results found for: LDLCALC No results found for: TRIG No results found for: CHOLHDL No results found for: HGBA1C    Assessment & Plan:  Transgender - Patient tolerated injection well without complications. Patient advised to schedule next injection 7 days from today.    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 testosterone cypionate (DEPOTESTOSTERONE CYPIONATE) injection 100 mg    Follow-up: Return in about 1 week (around 09/26/2019) for testosterone injection. [name], [name], [place]

## 2019-09-25 ENCOUNTER — Ambulatory Visit (INDEPENDENT_AMBULATORY_CARE_PROVIDER_SITE_OTHER): Payer: BC Managed Care – PPO | Admitting: Family Medicine

## 2019-09-25 VITALS — BP 115/63 | HR 73

## 2019-09-25 DIAGNOSIS — IMO0001 Reserved for inherently not codable concepts without codable children: Secondary | ICD-10-CM

## 2019-09-25 DIAGNOSIS — F640 Transsexualism: Secondary | ICD-10-CM

## 2019-09-25 DIAGNOSIS — E291 Testicular hypofunction: Secondary | ICD-10-CM

## 2019-09-25 MED ORDER — TESTOSTERONE CYPIONATE 200 MG/ML IM SOLN
100.0000 mg | Freq: Once | INTRAMUSCULAR | Status: AC
Start: 2019-09-25 — End: 2019-09-25
  Administered 2019-09-25: 100 mg via INTRAMUSCULAR

## 2019-09-25 MED ORDER — TESTOSTERONE CYPIONATE 200 MG/ML IM SOLN: 100.0000 mg | Freq: Once | INTRAMUSCULAR | Status: DC

## 2019-09-25 NOTE — Progress Notes (Signed)
Patient is here for testosterone injection. Denies chest pain, shortness of breath, headaches, and problems with medication or mood changes.   Patient tolerated testosterone 100 mg injection to LUOQ well without complications. Patient advised to schedule next injection in 7 days.   

## 2019-09-25 NOTE — Progress Notes (Signed)
Medical screening examination/treatment was performed by qualified clinical staff member and as supervising physician I was immediately available for consultation/collaboration. I have reviewed documentation and agree with assessment and plan.  Tiasia [place], DO  

## 2019-10-02 ENCOUNTER — Ambulatory Visit (INDEPENDENT_AMBULATORY_CARE_PROVIDER_SITE_OTHER): Payer: BC Managed Care – PPO | Admitting: Osteopathic Medicine

## 2019-10-02 VITALS — BP 117/72 | HR 78 | Wt 217.0 lb

## 2019-10-02 DIAGNOSIS — Z79899 Other long term (current) drug therapy: Secondary | ICD-10-CM

## 2019-10-02 DIAGNOSIS — F640 Transsexualism: Secondary | ICD-10-CM

## 2019-10-02 DIAGNOSIS — IMO0001 Reserved for inherently not codable concepts without codable children: Secondary | ICD-10-CM

## 2019-10-02 MED ORDER — TESTOSTERONE CYPIONATE 200 MG/ML IM SOLN
100.0000 mg | Freq: Once | INTRAMUSCULAR | Status: AC
Start: 2019-10-02 — End: 2019-10-02
  Administered 2019-10-02: 100 mg via INTRAMUSCULAR

## 2019-10-02 NOTE — Progress Notes (Signed)
Established Patient Office Visit  Subjective:  Patient ID: Victoria Hill, adult    DOB: May 18, 1985  Age: 34 y.o. MRN: [id number]  CC:  Chief Complaint  Patient presents with  . Injections    HPI Victoria Hill is here for a testosterone injection. Denies chest pain, shortness of breath, headaches or mood changes.   Past Medical History:  Diagnosis Date  . Anxiety   . Depression     Past Surgical History:  Procedure Laterality Date  . CHEST SURGERY      History reviewed. No pertinent family history.  Social History   Socioeconomic History  . Marital status: Soil scientist    Spouse name: Not on file  . Number of children: Not on file  . Years of education: Not on file  . Highest education level: Not on file  Occupational History  . Not on file  Tobacco Use  . Smoking status: Never Smoker  . Smokeless tobacco: Never Used  Vaping Use  . Vaping Use: Never used  Substance and Sexual Activity  . Alcohol use: Not Currently  . Drug use: Never  . Sexual activity: Never    Partners: Female  Other Topics Concern  . Not on file  Social History Narrative  . Not on file   Social Determinants of Health   Financial Resource Strain:   . Difficulty of Paying Living Expenses: Not on file  Food Insecurity:   . Worried About Charity fundraiser in the Last Year: Not on file  . Ran Out of Food in the Last Year: Not on file  Transportation Needs:   . Lack of Transportation (Medical): Not on file  . Lack of Transportation (Non-Medical): Not on file  Physical Activity:   . Days of Exercise per Week: Not on file  . Minutes of Exercise per Session: Not on file  Stress:   . Feeling of Stress : Not on file  Social Connections:   . Frequency of Communication with Friends and Family: Not on file  . Frequency of Social Gatherings with Friends and Family: Not on file  . Attends Religious Services: Not on file  . Active Member of Clubs or Organizations: Not on file  . Attends English as a second language teacher Meetings: Not on file  . Marital Status: Not on file  Intimate Partner Violence:   . Fear of Current or Ex-Partner: Not on file  . Emotionally Abused: Not on file  . Physically Abused: Not on file  . Sexually Abused: Not on file    Outpatient Medications Prior to Visit  Medication Sig Dispense Refill  . Ascorbic Acid (VITAMIN C PO) Take 100 mg by mouth.    Marland Kitchen buPROPion (WELLBUTRIN XL) 150 MG 24 hr tablet Take 1 tablet (150 mg total) by mouth every morning. 90 tablet 0  . Multiple Vitamins-Minerals (ZINC PO) Take 50 mg by mouth.    . testosterone cypionate (DEPOTESTOTERONE CYPIONATE) 100 MG/ML injection Inject 100 mg into the muscle every 7 (seven) days. For IM use only    . VITAMIN D PO Take 50 mg by mouth.    . sertraline (ZOLOFT) 50 MG tablet Take 1 tablet (50 mg total) by mouth daily for 5 days, THEN 0.5 tablets (25 mg total) daily for 5 days. 10 tablet 0   No facility-administered medications prior to visit.    No Known Allergies  ROS Review of Systems    Objective:    Physical Exam  BP 117/72   Pulse  78   Wt 217 lb (98.4 kg)   SpO2 100%   BMI 36.11 kg/m  Wt Readings from Last 3 Encounters:  10/02/19 217 lb (98.4 kg)  09/19/19 218 lb (98.9 kg)  09/11/19 219 lb (99.3 kg)     Health Maintenance Due  Topic Date Due  . Hepatitis C Screening  Never done  . HIV Screening  Never done    There are no preventive care reminders to display for this patient.  No results found for: TSH Lab Results  Component Value Date   WBC 6.1 09/11/2019   HGB 15.0 09/11/2019   HCT 44.6 09/11/2019   MCV 88.3 09/11/2019   PLT 350 09/11/2019   Lab Results  Component Value Date   NA 142 09/11/2019   K 4.1 09/11/2019   CO2 33 (H) 09/11/2019   GLUCOSE 87 09/11/2019   BUN 9 09/11/2019   CREATININE 0.86 09/11/2019   BILITOT 0.8 09/11/2019   AST 15 09/11/2019   ALT 21 09/11/2019   PROT 6.7 09/11/2019   CALCIUM 9.2 09/11/2019   No results found for: CHOL No  results found for: HDL No results found for: LDLCALC No results found for: TRIG No results found for: CHOLHDL No results found for: HGBA1C    Assessment & Plan:  Injection - Patient tolerated injection well without complications. Patient advised to schedule next injection 7 days from today.    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 testosterone cypionate (DEPOTESTOSTERONE CYPIONATE) injection 100 mg    Follow-up: Return in about 1 week (around 10/09/2019) for testosterone injection. [name], [name], [place]

## 2019-10-08 ENCOUNTER — Ambulatory Visit (INDEPENDENT_AMBULATORY_CARE_PROVIDER_SITE_OTHER): Payer: BC Managed Care – PPO | Admitting: Osteopathic Medicine

## 2019-10-08 ENCOUNTER — Other Ambulatory Visit: Payer: Self-pay

## 2019-10-08 VITALS — BP 107/68 | HR 77 | Wt 217.0 lb

## 2019-10-08 DIAGNOSIS — IMO0001 Reserved for inherently not codable concepts without codable children: Secondary | ICD-10-CM

## 2019-10-08 DIAGNOSIS — Z79899 Other long term (current) drug therapy: Secondary | ICD-10-CM | POA: Diagnosis not present

## 2019-10-08 DIAGNOSIS — F640 Transsexualism: Secondary | ICD-10-CM | POA: Diagnosis not present

## 2019-10-08 MED ORDER — TESTOSTERONE CYPIONATE 200 MG/ML IM SOLN: 200.0000 mg | Freq: Once | INTRAMUSCULAR | Status: DC

## 2019-10-08 MED ORDER — TESTOSTERONE CYPIONATE 200 MG/ML IM SOLN
100.0000 mg | Freq: Once | INTRAMUSCULAR | Status: AC
  Administered 2019-10-08: 100 mg via INTRAMUSCULAR

## 2019-10-08 NOTE — Progress Notes (Signed)
   Subjective:    Patient ID: Victoria Hill, adult    DOB: 07-13-1985, 34 y.o.   MRN: [id number]  HPI Patient is here for a testosterone injection. Denies chest pain, shortness of breath, headaches, problems with mood change, or medication problems.    Review of Systems     Objective:   Physical Exam        Assessment & Plan:  Patient tolerated injection of 100 mg in LUOQ well without complications. Patient advised to schedule his next injection for 1 week from today.

## 2019-10-15 ENCOUNTER — Other Ambulatory Visit: Payer: Self-pay

## 2019-10-15 ENCOUNTER — Ambulatory Visit (INDEPENDENT_AMBULATORY_CARE_PROVIDER_SITE_OTHER): Payer: BC Managed Care – PPO | Admitting: Osteopathic Medicine

## 2019-10-15 VITALS — BP 120/64 | HR 78 | Wt 217.0 lb

## 2019-10-15 DIAGNOSIS — F640 Transsexualism: Secondary | ICD-10-CM

## 2019-10-15 DIAGNOSIS — Z01812 Encounter for preprocedural laboratory examination: Secondary | ICD-10-CM

## 2019-10-15 DIAGNOSIS — E291 Testicular hypofunction: Secondary | ICD-10-CM

## 2019-10-15 MED ORDER — TESTOSTERONE CYPIONATE 200 MG/ML IM SOLN
100.0000 mg | Freq: Once | INTRAMUSCULAR | Status: AC
  Administered 2019-10-15: 100 mg via INTRAMUSCULAR

## 2019-10-15 NOTE — Progress Notes (Signed)
Pt here for testosterone injection no SOB,CP or mood swings. Injection tolerated well 100 mg given in RUOQ. Pt to RTC in 1 wk for next injection.

## 2019-10-23 ENCOUNTER — Ambulatory Visit (INDEPENDENT_AMBULATORY_CARE_PROVIDER_SITE_OTHER): Payer: BC Managed Care – PPO | Admitting: Osteopathic Medicine

## 2019-10-23 ENCOUNTER — Other Ambulatory Visit: Payer: Self-pay

## 2019-10-23 VITALS — BP 130/67 | HR 76 | Temp 98.1°F | Wt 217.1 lb

## 2019-10-23 DIAGNOSIS — Z79899 Other long term (current) drug therapy: Secondary | ICD-10-CM

## 2019-10-23 DIAGNOSIS — IMO0001 Reserved for inherently not codable concepts without codable children: Secondary | ICD-10-CM

## 2019-10-23 DIAGNOSIS — F640 Transsexualism: Secondary | ICD-10-CM

## 2019-10-23 MED ORDER — TESTOSTERONE CYPIONATE 200 MG/ML IM SOLN
100.0000 mg | Freq: Once | INTRAMUSCULAR | Status: AC
  Administered 2019-10-23: 100 mg via INTRAMUSCULAR

## 2019-10-23 NOTE — Progress Notes (Signed)
Patient is here for a testosterone injection. Denies chest pain, shortness of breath, headaches, and problems with medication or mood changes.   Patient tolerated injection well on LUOQ without any complications. Patient advised to schedule next injection in 7 days.

## 2019-10-29 ENCOUNTER — Ambulatory Visit: Payer: BC Managed Care – PPO

## 2019-10-30 ENCOUNTER — Ambulatory Visit (INDEPENDENT_AMBULATORY_CARE_PROVIDER_SITE_OTHER): Payer: BC Managed Care – PPO | Admitting: Family Medicine

## 2019-10-30 VITALS — BP 112/68 | HR 84 | Wt 216.0 lb

## 2019-10-30 DIAGNOSIS — Z79899 Other long term (current) drug therapy: Secondary | ICD-10-CM

## 2019-10-30 DIAGNOSIS — IMO0001 Reserved for inherently not codable concepts without codable children: Secondary | ICD-10-CM

## 2019-10-30 DIAGNOSIS — F640 Transsexualism: Secondary | ICD-10-CM

## 2019-10-30 MED ORDER — TESTOSTERONE CYPIONATE 200 MG/ML IM SOLN
100.0000 mg | Freq: Once | INTRAMUSCULAR | Status: AC
  Administered 2019-10-30: 100 mg via INTRAMUSCULAR

## 2019-10-30 NOTE — Progress Notes (Signed)
Patient is here for a testosterone injection. Denies chest pain, shortness of breath, headaches, and problems with medication or mood changes.   Patient tolerated injection well on RUOQ without any complications. Patient advised to schedule next injection in 7 days.

## 2019-10-30 NOTE — Progress Notes (Signed)
Medical screening examination/treatment was performed by qualified clinical staff member and as supervising physician I was immediately available for consultation/collaboration. I have reviewed documentation and agree with assessment and plan.  Patsy Varma, DO  

## 2019-11-06 ENCOUNTER — Ambulatory Visit: Payer: BC Managed Care – PPO

## 2019-11-06 ENCOUNTER — Ambulatory Visit (INDEPENDENT_AMBULATORY_CARE_PROVIDER_SITE_OTHER): Payer: BC Managed Care – PPO | Admitting: Osteopathic Medicine

## 2019-11-06 VITALS — BP 112/58 | HR 78 | Ht 65.0 in

## 2019-11-06 DIAGNOSIS — Z79899 Other long term (current) drug therapy: Secondary | ICD-10-CM

## 2019-11-06 DIAGNOSIS — F640 Transsexualism: Secondary | ICD-10-CM

## 2019-11-06 DIAGNOSIS — IMO0001 Reserved for inherently not codable concepts without codable children: Secondary | ICD-10-CM

## 2019-11-06 MED ORDER — TESTOSTERONE CYPIONATE 200 MG/ML IM SOLN
100.0000 mg | Freq: Once | INTRAMUSCULAR | Status: AC
  Administered 2019-11-06: 100 mg via INTRAMUSCULAR

## 2019-11-06 NOTE — Progress Notes (Signed)
Patient is here for testosterone injection. Denies chest pain, shortness of breath, headaches, and problems with medication or mood changes.   Patient tolerated testosterone 100 mg injection to LUOQ well without complications. Patient advised to schedule next injection in 7 days.   

## 2019-11-12 ENCOUNTER — Ambulatory Visit (INDEPENDENT_AMBULATORY_CARE_PROVIDER_SITE_OTHER): Payer: BC Managed Care – PPO | Admitting: Osteopathic Medicine

## 2019-11-12 VITALS — BP 108/67 | HR 83 | Wt 217.0 lb

## 2019-11-12 DIAGNOSIS — F640 Transsexualism: Secondary | ICD-10-CM

## 2019-11-12 DIAGNOSIS — Z79899 Other long term (current) drug therapy: Secondary | ICD-10-CM

## 2019-11-12 DIAGNOSIS — IMO0001 Reserved for inherently not codable concepts without codable children: Secondary | ICD-10-CM

## 2019-11-12 MED ORDER — TESTOSTERONE CYPIONATE 200 MG/ML IM SOLN
100.0000 mg | Freq: Once | INTRAMUSCULAR | Status: AC
  Administered 2019-11-12: 100 mg via INTRAMUSCULAR

## 2019-11-12 NOTE — Progress Notes (Signed)
   Subjective:    Patient ID: Victoria Hill, adult    DOB: 24-Apr-1985, 34 y.o.   MRN: [id number]  HPI Patient here for a testosterone injection.  Denies chest pain, shortness of breath, headaches and problems with medication or meed changes.     Review of Systems     Objective:   Physical Exam        Assessment & Plan:  Patient tolerated injection well without complications.  Patient advised to schedule next injection in 7 days.

## 2019-11-20 ENCOUNTER — Ambulatory Visit (INDEPENDENT_AMBULATORY_CARE_PROVIDER_SITE_OTHER): Payer: BC Managed Care – PPO | Admitting: Osteopathic Medicine

## 2019-11-20 ENCOUNTER — Other Ambulatory Visit: Payer: Self-pay

## 2019-11-20 VITALS — BP 124/75 | HR 88 | Wt 217.0 lb

## 2019-11-20 DIAGNOSIS — F640 Transsexualism: Secondary | ICD-10-CM

## 2019-11-20 DIAGNOSIS — Z79899 Other long term (current) drug therapy: Secondary | ICD-10-CM

## 2019-11-20 DIAGNOSIS — IMO0001 Reserved for inherently not codable concepts without codable children: Secondary | ICD-10-CM

## 2019-11-20 MED ORDER — TESTOSTERONE CYPIONATE 200 MG/ML IM SOLN
100.0000 mg | Freq: Once | INTRAMUSCULAR | Status: AC
  Administered 2019-11-20: 100 mg via INTRAMUSCULAR

## 2019-11-20 NOTE — Progress Notes (Signed)
   Subjective:    Patient ID: Victoria Hill, adult    DOB: [birth year], 34 y.o.   MRN: [id number]  HPI Patient here for a testosterone injectione.  Denies chest pain, shortness of breath, headaches and problems with medication or mood changes.     Review of Systems     Objective:   Physical Exam        Assessment & Plan:  Patient tolerated injection well without complications.  Patient advised to scheduled next injection in 7 days.

## 2019-11-27 ENCOUNTER — Ambulatory Visit (INDEPENDENT_AMBULATORY_CARE_PROVIDER_SITE_OTHER): Payer: BC Managed Care – PPO | Admitting: Osteopathic Medicine

## 2019-11-27 VITALS — BP 116/64 | HR 82

## 2019-11-27 DIAGNOSIS — F640 Transsexualism: Secondary | ICD-10-CM

## 2019-11-27 DIAGNOSIS — Z79899 Other long term (current) drug therapy: Secondary | ICD-10-CM | POA: Diagnosis not present

## 2019-11-27 DIAGNOSIS — IMO0001 Reserved for inherently not codable concepts without codable children: Secondary | ICD-10-CM

## 2019-11-27 MED ORDER — TESTOSTERONE CYPIONATE 200 MG/ML IM SOLN
100.0000 mg | Freq: Once | INTRAMUSCULAR | Status: AC
  Administered 2019-11-27: 100 mg via INTRAMUSCULAR

## 2019-11-27 NOTE — Progress Notes (Signed)
Established Patient Office Visit  Subjective:  Patient ID: Victoria Hill, adult    DOB: Feb 15, 1985  Age: 34 y.o. MRN: [id number]  CC:  Chief Complaint  Patient presents with  . Injections    HPI Victoria Hill is here for a testosterone injection. Denies chest pain, shortness of breath, headaches or mood changes.    Past Medical History:  Diagnosis Date  . Anxiety   . Depression     Past Surgical History:  Procedure Laterality Date  . CHEST SURGERY      History reviewed. No pertinent family history.  Social History   Socioeconomic History  . Marital status: Soil scientist    Spouse name: Not on file  . Number of children: Not on file  . Years of education: Not on file  . Highest education level: Not on file  Occupational History  . Not on file  Tobacco Use  . Smoking status: Never Smoker  . Smokeless tobacco: Never Used  Vaping Use  . Vaping Use: Never used  Substance and Sexual Activity  . Alcohol use: Not Currently  . Drug use: Never  . Sexual activity: Never    Partners: Female  Other Topics Concern  . Not on file  Social History Narrative  . Not on file   Social Determinants of Health   Financial Resource Strain:   . Difficulty of Paying Living Expenses: Not on file  Food Insecurity:   . Worried About Charity fundraiser in the Last Year: Not on file  . Ran Out of Food in the Last Year: Not on file  Transportation Needs:   . Lack of Transportation (Medical): Not on file  . Lack of Transportation (Non-Medical): Not on file  Physical Activity:   . Days of Exercise per Week: Not on file  . Minutes of Exercise per Session: Not on file  Stress:   . Feeling of Stress : Not on file  Social Connections:   . Frequency of Communication with Friends and Family: Not on file  . Frequency of Social Gatherings with Friends and Family: Not on file  . Attends Religious Services: Not on file  . Active Member of Clubs or Organizations: Not on file  . Attends English as a second language teacher Meetings: Not on file  . Marital Status: Not on file  Intimate Partner Violence:   . Fear of Current or Ex-Partner: Not on file  . Emotionally Abused: Not on file  . Physically Abused: Not on file  . Sexually Abused: Not on file    Outpatient Medications Prior to Visit  Medication Sig Dispense Refill  . Ascorbic Acid (VITAMIN C PO) Take 100 mg by mouth.    Marland Kitchen buPROPion (WELLBUTRIN XL) 150 MG 24 hr tablet Take 1 tablet (150 mg total) by mouth every morning. 90 tablet 0  . Multiple Vitamins-Minerals (ZINC PO) Take 50 mg by mouth.    . TESTOSTERONE CYPIONATE IM Inject 100 mg into the muscle every 7 (seven) days.    Marland Kitchen VITAMIN D PO Take 50 mg by mouth.     No facility-administered medications prior to visit.    No Known Allergies  ROS Review of Systems    Objective:    Physical Exam  BP 116/64   Pulse 82   SpO2 100%  Wt Readings from Last 3 Encounters:  11/20/19 217 lb (98.4 kg)  11/12/19 217 lb (98.4 kg)  10/30/19 216 lb (98 kg)     Health Maintenance Due  Topic  Date Due  . Hepatitis C Screening  Never done  . HIV Screening  Never done    There are no preventive care reminders to display for this patient.  No results found for: TSH Lab Results  Component Value Date   WBC 6.1 09/11/2019   HGB 15.0 09/11/2019   HCT 44.6 09/11/2019   MCV 88.3 09/11/2019   PLT 350 09/11/2019   Lab Results  Component Value Date   NA 142 09/11/2019   K 4.1 09/11/2019   CO2 33 (H) 09/11/2019   GLUCOSE 87 09/11/2019   BUN 9 09/11/2019   CREATININE 0.86 09/11/2019   BILITOT 0.8 09/11/2019   AST 15 09/11/2019   ALT 21 09/11/2019   PROT 6.7 09/11/2019   CALCIUM 9.2 09/11/2019   No results found for: CHOL No results found for: HDL No results found for: LDLCALC No results found for: TRIG No results found for: CHOLHDL No results found for: HGBA1C    Assessment & Plan:  Testosterone injection - Patient tolerated injection well without complications. Patient  advised to schedule next injection 7 days from today.    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 testosterone cypionate (DEPOTESTOSTERONE CYPIONATE) injection 100 mg    Follow-up: Return in about 1 week (around 12/04/2019) for testosterone with Dr Sheppard Coil.    Lavell Luster, [place]

## 2019-12-03 ENCOUNTER — Ambulatory Visit (INDEPENDENT_AMBULATORY_CARE_PROVIDER_SITE_OTHER): Payer: BC Managed Care – PPO | Admitting: Osteopathic Medicine

## 2019-12-03 ENCOUNTER — Other Ambulatory Visit: Payer: Self-pay

## 2019-12-03 VITALS — BP 128/56 | HR 73

## 2019-12-03 DIAGNOSIS — F640 Transsexualism: Secondary | ICD-10-CM

## 2019-12-03 DIAGNOSIS — IMO0001 Reserved for inherently not codable concepts without codable children: Secondary | ICD-10-CM

## 2019-12-03 DIAGNOSIS — Z79899 Other long term (current) drug therapy: Secondary | ICD-10-CM

## 2019-12-03 MED ORDER — TESTOSTERONE CYPIONATE 200 MG/ML IM SOLN
100.0000 mg | Freq: Once | INTRAMUSCULAR | Status: AC
  Administered 2019-12-03: 100 mg via INTRAMUSCULAR

## 2019-12-03 NOTE — Progress Notes (Signed)
   Subjective:    Patient ID: Victoria Hill, adult    DOB: 05/05/1985, 34 y.o.   MRN: [id number]  HPI Patient is here for a testosterone injection. Denies chest pain, shortness of breath, headaches, problems with mood change, or medication problems.   Review of Systems     Objective:   Physical Exam        Assessment & Plan:  Patient tolerated injection in LUOQ well without complications. Patient advised to schedule next injection for 1 week from today.

## 2019-12-10 ENCOUNTER — Ambulatory Visit: Payer: BC Managed Care – PPO

## 2019-12-11 ENCOUNTER — Ambulatory Visit: Payer: BC Managed Care – PPO | Admitting: Osteopathic Medicine

## 2019-12-15 ENCOUNTER — Ambulatory Visit (INDEPENDENT_AMBULATORY_CARE_PROVIDER_SITE_OTHER): Payer: BC Managed Care – PPO | Admitting: Osteopathic Medicine

## 2019-12-15 ENCOUNTER — Other Ambulatory Visit: Payer: Self-pay

## 2019-12-15 ENCOUNTER — Ambulatory Visit (INDEPENDENT_AMBULATORY_CARE_PROVIDER_SITE_OTHER): Payer: BC Managed Care – PPO

## 2019-12-15 ENCOUNTER — Encounter: Payer: Self-pay | Admitting: Osteopathic Medicine

## 2019-12-15 VITALS — BP 122/81 | HR 95 | Temp 97.9°F | Wt 217.1 lb

## 2019-12-15 DIAGNOSIS — F32A Depression, unspecified: Secondary | ICD-10-CM

## 2019-12-15 DIAGNOSIS — G8929 Other chronic pain: Secondary | ICD-10-CM

## 2019-12-15 DIAGNOSIS — M7731 Calcaneal spur, right foot: Secondary | ICD-10-CM | POA: Diagnosis not present

## 2019-12-15 DIAGNOSIS — Z79899 Other long term (current) drug therapy: Secondary | ICD-10-CM | POA: Diagnosis not present

## 2019-12-15 DIAGNOSIS — M25571 Pain in right ankle and joints of right foot: Secondary | ICD-10-CM | POA: Insufficient documentation

## 2019-12-15 DIAGNOSIS — F419 Anxiety disorder, unspecified: Secondary | ICD-10-CM

## 2019-12-15 DIAGNOSIS — F640 Transsexualism: Secondary | ICD-10-CM | POA: Diagnosis not present

## 2019-12-15 DIAGNOSIS — IMO0001 Reserved for inherently not codable concepts without codable children: Secondary | ICD-10-CM

## 2019-12-15 MED ORDER — BUPROPION HCL ER (XL) 150 MG PO TB24
150.0000 mg | ORAL_TABLET | ORAL | 3 refills | Status: DC
Start: 2019-12-15 — End: 2020-09-28

## 2019-12-15 MED ORDER — TESTOSTERONE CYPIONATE 200 MG/ML IM SOLN
100.0000 mg | Freq: Once | INTRAMUSCULAR | Status: AC
  Administered 2019-12-15: 100 mg via INTRAMUSCULAR

## 2019-12-15 MED ORDER — MELOXICAM 15 MG PO TABS: ORAL_TABLET | ORAL | 3 refills | Status: DC

## 2019-12-15 NOTE — Progress Notes (Signed)
Victoria Hill is a 34 y.o. adult who presents to  [place] at [facility]  today, 12/15/19, seeking care for the following:  . Recheck mental health  . T injection      ASSESSMENT & PLAN with other pertinent findings:  The primary encounter diagnosis was Anxiety and depression. 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   1. 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 CBC/CMP 08/2019 WNL.   2. Anxiety and depression  previous visit we switched from fluoxetine to sertraline.  Sertraline seemed to be working fairly well but he had noticed some concentration issues/short-term memory problems, last visit 3 mos ago we started Wellbutrin and planned to taper off the Zoloft. PHQ/GAD stable as below, low symptom scores per self-report. Pt reports doing really well on this Rx!   3. Pain and loss sensation RLE lateral ankle shootin up towars L hip --> appreciate consult from sports medicine, see Dr Mcneil Sober notes   No results found for this or any previous visit (from the past 24 hour(s)).   Last labs   There are no Patient Instructions on file for this visit.  Orders Placed This Encounter  Procedures  . DG Ankle Complete Right  . Ambulatory referral to Sports Medicine    Meds ordered this encounter  Medications  . testosterone cypionate (DEPOTESTOSTERONE CYPIONATE) injection 100 mg       Follow-up instructions: Return for [facility] VISIT FOR TESTOSTERONE INJECTIONS. ANNUAL W/ DR Sheppard Coil IN 6 MOS .                                     Depression screen [facility] 2/9 12/15/2019 09/11/2019 06/11/2019  Decreased Interest 0 0 1  Down, Depressed, Hopeless 0 1 1  PHQ - 2 Score 0 1 2  Altered sleeping 0 - 1  Tired, decreased energy 1 - 1  Change in appetite 1 - 1  Feeling bad or failure  about yourself  0 - 0  Trouble concentrating 0 - 1  Moving slowly or fidgety/restless 0 - 0  Suicidal thoughts 0 - 0  PHQ-9 Score 2 - 6  Difficult doing work/chores Not difficult at all - Somewhat difficult   GAD 7 : Generalized Anxiety Score 12/15/2019 06/11/2019  Nervous, Anxious, on Edge 0 0  Control/stop worrying 0 0  Worry too much - different things 0 0  Trouble relaxing 0 1  Restless 0 0  Easily annoyed or irritable 1 0  Afraid - awful might happen 0 0  Total GAD 7 Score 1 1  Anxiety Difficulty - Not difficult at all         BP 122/81 (BP Location: Right Arm, Patient Position: Sitting, Cuff Size: Large)   Pulse 95   Temp 97.9 F (36.6 C) (Oral)   Wt 217 lb 1.3 oz (98.5 kg)   BMI 36.12 kg/m   Current Meds  Medication Sig  . Ascorbic Acid (VITAMIN C PO) Take 100 mg by mouth.  Marland Kitchen buPROPion (WELLBUTRIN XL) 150 MG 24 hr tablet Take 1 tablet (150 mg total) by mouth every morning.  . Multiple Vitamins-Minerals (ZINC PO) Take 50 mg by mouth.  . TESTOSTERONE CYPIONATE IM Inject 100 mg into the muscle every 7 (seven) days.  Marland Kitchen  VITAMIN D PO Take 50 mg by mouth.    No results found for this or any previous visit (from the past 72 hour(s)).  No results found.     All questions at time of visit were answered - patient instructed to contact office with any additional concerns or updates.  ER/RTC precautions were reviewed with the patient as applicable.   Please note: voice recognition software was used to produce this document, and typos may escape review. Please contact Dr. Sheppard Coil for any needed clarifications.

## 2019-12-15 NOTE — Assessment & Plan Note (Signed)
This is a pleasant 34 year old female to female, he has been having about a year of pain in his right ankle, lateral aspect over the talar dome. 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 Has not tried custom orthotics or NSAIDs. On exam the ankle is stable, there is significant pes cavus, and tenderness over the sinus tarsi and lateral talar dome. We will start with conservative treatment, x-rays, lace up ankle brace at work, meloxicam, referral for custom molded orthotics. Return to see me in 4 weeks, MRI versus sinus tarsi injection if no better.

## 2019-12-15 NOTE — Progress Notes (Signed)
    Procedures performed today:    None.  Independent interpretation of notes and tests performed by another provider:   None.  Brief History, Exam, Impression, and Recommendations:    Right ankle pain This is a pleasant 34 year old female to female, he has been having about a year of pain in his right ankle, lateral aspect over the talar dome. 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 Has not tried custom orthotics or NSAIDs. On exam the ankle is stable, there is significant pes cavus, and tenderness over the sinus tarsi and lateral talar dome. We will start with conservative treatment, x-rays, lace up ankle brace at work, meloxicam, referral for custom molded orthotics. Return to see me in 4 weeks, MRI versus sinus tarsi injection if no better.    ___________________________________________ Gwen Her. Dianah Field, M.D., ABFM., CAQSM. Primary Care and [employer] [place] Instructor of [place] [facility]

## 2019-12-17 ENCOUNTER — Ambulatory Visit: Payer: BC Managed Care – PPO | Admitting: Family Medicine

## 2019-12-17 ENCOUNTER — Encounter: Payer: Self-pay | Admitting: Family Medicine

## 2019-12-17 ENCOUNTER — Other Ambulatory Visit: Payer: Self-pay

## 2019-12-17 DIAGNOSIS — M25571 Pain in right ankle and joints of right foot: Secondary | ICD-10-CM

## 2019-12-17 DIAGNOSIS — G8929 Other chronic pain: Secondary | ICD-10-CM | POA: Diagnosis not present

## 2019-12-17 NOTE — Progress Notes (Signed)
Victoria Hill - 34 y.o. adult MRN [id number]  Date of birth: 1985-06-05  SUBJECTIVE:  Including CC & ROS.  Chief Complaint  Patient presents with  . Ankle Pain    right    Victoria Hill is a 34 y.o. adult that is presenting with right ankle pain.  Has acute on chronic ankle pain.   Review of Systems See HPI   HISTORY: Past Medical, Surgical, Social, and Family History Reviewed & Updated per EMR.   Pertinent Historical Findings include:  Past Medical History:  Diagnosis Date  . Anxiety   . Depression     Past Surgical History:  Procedure Laterality Date  . CHEST SURGERY      No family history on file.  Social History   Socioeconomic History  . Marital status: Soil scientist    Spouse name: Not on file  . Number of children: Not on file  . Years of education: Not on file  . Highest education level: Not on file  Occupational History  . Not on file  Tobacco Use  . Smoking status: Never Smoker  . Smokeless tobacco: Never Used  Vaping Use  . Vaping Use: Never used  Substance and Sexual Activity  . Alcohol use: Not Currently  . Drug use: Never  . Sexual activity: Never    Partners: Female  Other Topics Concern  . Not on file  Social History Narrative  . Not on file   Social Determinants of Health   Financial Resource Strain:   . Difficulty of Paying Living Expenses: Not on file  Food Insecurity:   . Worried About Charity fundraiser in the Last Year: Not on file  . Ran Out of Food in the Last Year: Not on file  Transportation Needs:   . Lack of Transportation (Medical): Not on file  . Lack of Transportation (Non-Medical): Not on file  Physical Activity:   . Days of Exercise per Week: Not on file  . Minutes of Exercise per Session: Not on file  Stress:   . Feeling of Stress : Not on file  Social Connections:   . Frequency of Communication with Friends and Family: Not on file  . Frequency of Social Gatherings with Friends and Family: Not on file  . Attends  Religious Services: Not on file  . Active Member of Clubs or Organizations: Not on file  . Attends Archivist Meetings: Not on file  . Marital Status: Not on file  Intimate Partner Violence:   . Fear of Current or Ex-Partner: Not on file  . Emotionally Abused: Not on file  . Physically Abused: Not on file  . Sexually Abused: Not on file     PHYSICAL EXAM:  VS: BP 107/71   Pulse (!) 105   Ht 5\' 6"  (1.676 m)   Wt 217 lb (98.4 kg)   BMI 35.02 kg/m  Physical Exam Gen: NAD, alert, cooperative with exam, well-appearing MSK:  Right foot and ankle: Cavus foot. Hypermobility through the midfoot. Some loss of the transverse arch. Neurovascular intact  Patient was fitted for a standard, cushioned, semi-rigid orthotic. The orthotic was heated and afterward the patient stood on the orthotic blank positioned on the orthotic stand. The patient was positioned in subtalar neutral position and 10 degrees of ankle dorsiflexion in a weight bearing stance. After completion of molding, a stable base was applied to the orthotic blank. The blank was ground to a stable position for weight bearing. Size: 7 Pairs: 2  Base: [facility] and Padding: None The patient ambulated these, and they were very comfortable.     ASSESSMENT & PLAN:   Right ankle pain Hypermobility is likely playing a line.  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 -Counseled on home exercise therapy and supportive care. -Orthotics. -Could consider additional padding.

## 2019-12-17 NOTE — Assessment & Plan Note (Signed)
Hypermobility is likely playing a line.  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 -Counseled on home exercise therapy and supportive care. -Orthotics. -Could consider additional padding.

## 2019-12-22 ENCOUNTER — Ambulatory Visit (INDEPENDENT_AMBULATORY_CARE_PROVIDER_SITE_OTHER): Payer: BC Managed Care – PPO | Admitting: Osteopathic Medicine

## 2019-12-22 VITALS — BP 121/64 | HR 85 | Ht 66.0 in | Wt 216.0 lb

## 2019-12-22 DIAGNOSIS — IMO0001 Reserved for inherently not codable concepts without codable children: Secondary | ICD-10-CM

## 2019-12-22 DIAGNOSIS — Z79899 Other long term (current) drug therapy: Secondary | ICD-10-CM

## 2019-12-22 DIAGNOSIS — F640 Transsexualism: Secondary | ICD-10-CM | POA: Diagnosis not present

## 2019-12-22 MED ORDER — TESTOSTERONE CYPIONATE 200 MG/ML IM SOLN
100.0000 mg | Freq: Once | INTRAMUSCULAR | Status: AC
  Administered 2019-12-22: 100 mg via INTRAMUSCULAR

## 2019-12-22 NOTE — Progress Notes (Signed)
   Subjective:    Patient ID: Victoria Hill, adult    DOB: 1985-02-16, 34 y.o.   MRN: [id number]  HPI Patient is here for a testosterone injection. Denies chest pain, shortness of breath, headaches, problems with mood change, or medication problems.     Review of Systems     Objective:   Physical Exam        Assessment & Plan:  Patient tolerated injection in LUOQ well without complications. Patient advised to schedule next injection for 1 week from today.

## 2019-12-29 ENCOUNTER — Ambulatory Visit (INDEPENDENT_AMBULATORY_CARE_PROVIDER_SITE_OTHER): Payer: BC Managed Care – PPO | Admitting: Osteopathic Medicine

## 2019-12-29 VITALS — BP 118/71 | HR 88

## 2019-12-29 DIAGNOSIS — F640 Transsexualism: Secondary | ICD-10-CM

## 2019-12-29 DIAGNOSIS — Z79899 Other long term (current) drug therapy: Secondary | ICD-10-CM | POA: Diagnosis not present

## 2019-12-29 DIAGNOSIS — IMO0001 Reserved for inherently not codable concepts without codable children: Secondary | ICD-10-CM

## 2019-12-29 MED ORDER — TESTOSTERONE CYPIONATE 200 MG/ML IM SOLN
100.0000 mg | Freq: Once | INTRAMUSCULAR | Status: AC
  Administered 2019-12-29: 100 mg via INTRAMUSCULAR

## 2019-12-29 NOTE — Progress Notes (Signed)
Established Patient Office Visit  Subjective:  Patient ID: Victoria Hill, adult    DOB: 05-04-1985  Age: 34 y.o. MRN: [id number]  CC:  Chief Complaint  Patient presents with  . Injections    HPI Victoria Hill is here for a testosterone injection. Denies chest pain, shortness of breath, headaches or mood changes.   Past Medical History:  Diagnosis Date  . Anxiety   . Depression     Past Surgical History:  Procedure Laterality Date  . CHEST SURGERY      History reviewed. No pertinent family history.  Social History   Socioeconomic History  . Marital status: Soil scientist    Spouse name: Not on file  . Number of children: Not on file  . Years of education: Not on file  . Highest education level: Not on file  Occupational History  . Not on file  Tobacco Use  . Smoking status: Never Smoker  . Smokeless tobacco: Never Used  Vaping Use  . Vaping Use: Never used  Substance and Sexual Activity  . Alcohol use: Not Currently  . Drug use: Never  . Sexual activity: Never    Partners: Female  Other Topics Concern  . Not on file  Social History Narrative  . Not on file   Social Determinants of Health   Financial Resource Strain:   . Difficulty of Paying Living Expenses: Not on file  Food Insecurity:   . Worried About Charity fundraiser in the Last Year: Not on file  . Ran Out of Food in the Last Year: Not on file  Transportation Needs:   . Lack of Transportation (Medical): Not on file  . Lack of Transportation (Non-Medical): Not on file  Physical Activity:   . Days of Exercise per Week: Not on file  . Minutes of Exercise per Session: Not on file  Stress:   . Feeling of Stress : Not on file  Social Connections:   . Frequency of Communication with Friends and Family: Not on file  . Frequency of Social Gatherings with Friends and Family: Not on file  . Attends Religious Services: Not on file  . Active Member of Clubs or Organizations: Not on file  . Attends English as a second language teacher Meetings: Not on file  . Marital Status: Not on file  Intimate Partner Violence:   . Fear of Current or Ex-Partner: Not on file  . Emotionally Abused: Not on file  . Physically Abused: Not on file  . Sexually Abused: Not on file    Outpatient Medications Prior to Visit  Medication Sig Dispense Refill  . Ascorbic Acid (VITAMIN C PO) Take 100 mg by mouth.    Marland Kitchen buPROPion (WELLBUTRIN XL) 150 MG 24 hr tablet Take 1 tablet (150 mg total) by mouth every morning. 90 tablet 3  . meloxicam (MOBIC) 15 MG tablet One tab PO qAM with a meal for 2 weeks, then daily prn pain. 30 tablet 3  . Multiple Vitamins-Minerals (ZINC PO) Take 50 mg by mouth.    . TESTOSTERONE CYPIONATE IM Inject 100 mg into the muscle every 7 (seven) days.    Marland Kitchen VITAMIN D PO Take 50 mg by mouth.     No facility-administered medications prior to visit.    No Known Allergies  ROS Review of Systems    Objective:    Physical Exam  BP 118/71   Pulse 88   SpO2 99%  Wt Readings from Last 3 Encounters:  12/22/19 216 lb (  98 kg)  12/17/19 217 lb (98.4 kg)  12/15/19 217 lb 1.3 oz (98.5 kg)     Health Maintenance Due  Topic Date Due  . Hepatitis C Screening  Never done  . HIV Screening  Never done    There are no preventive care reminders to display for this patient.  No results found for: TSH Lab Results  Component Value Date   WBC 6.1 09/11/2019   HGB 15.0 09/11/2019   HCT 44.6 09/11/2019   MCV 88.3 09/11/2019   PLT 350 09/11/2019   Lab Results  Component Value Date   NA 142 09/11/2019   K 4.1 09/11/2019   CO2 33 (H) 09/11/2019   GLUCOSE 87 09/11/2019   BUN 9 09/11/2019   CREATININE 0.86 09/11/2019   BILITOT 0.8 09/11/2019   AST 15 09/11/2019   ALT 21 09/11/2019   PROT 6.7 09/11/2019   CALCIUM 9.2 09/11/2019   No results found for: CHOL No results found for: HDL No results found for: LDLCALC No results found for: TRIG No results found for: CHOLHDL No results found for:  HGBA1C    Assessment & Plan:  Testosterone injection - Patient tolerated injection well without complications. Patient advised to schedule next injection 7 days from today.    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 testosterone cypionate (DEPOTESTOSTERONE CYPIONATE) injection 100 mg    Follow-up: Return in about 1 week (around 01/05/2020) for testosterone injection.[name], [name], [place]

## 2020-01-05 ENCOUNTER — Other Ambulatory Visit: Payer: Self-pay

## 2020-01-05 ENCOUNTER — Ambulatory Visit (INDEPENDENT_AMBULATORY_CARE_PROVIDER_SITE_OTHER): Payer: BC Managed Care – PPO | Admitting: Osteopathic Medicine

## 2020-01-05 VITALS — BP 120/63 | HR 87

## 2020-01-05 DIAGNOSIS — IMO0001 Reserved for inherently not codable concepts without codable children: Secondary | ICD-10-CM

## 2020-01-05 DIAGNOSIS — F640 Transsexualism: Secondary | ICD-10-CM | POA: Diagnosis not present

## 2020-01-05 DIAGNOSIS — Z79899 Other long term (current) drug therapy: Secondary | ICD-10-CM | POA: Diagnosis not present

## 2020-01-05 MED ORDER — TESTOSTERONE CYPIONATE 200 MG/ML IM SOLN
100.0000 mg | Freq: Once | INTRAMUSCULAR | Status: AC
  Administered 2020-01-05: 100 mg via INTRAMUSCULAR

## 2020-01-05 NOTE — Progress Notes (Signed)
Established Patient Office Visit  Subjective:  Patient ID: Victoria Hill, adult    DOB: Oct 28, 1985  Age: 34 y.o. MRN: [id number]  CC:  Chief Complaint  Patient presents with  . Injections    HPI Victoria Hill is here for a testosterone injection. Denies chest pain, shortness of breath, headaches or mood changes.   Past Medical History:  Diagnosis Date  . Anxiety   . Depression     Past Surgical History:  Procedure Laterality Date  . CHEST SURGERY      History reviewed. No pertinent family history.  Social History   Socioeconomic History  . Marital status: Soil scientist    Spouse name: Not on file  . Number of children: Not on file  . Years of education: Not on file  . Highest education level: Not on file  Occupational History  . Not on file  Tobacco Use  . Smoking status: Never Smoker  . Smokeless tobacco: Never Used  Vaping Use  . Vaping Use: Never used  Substance and Sexual Activity  . Alcohol use: Not Currently  . Drug use: Never  . Sexual activity: Never    Partners: Female  Other Topics Concern  . Not on file  Social History Narrative  . Not on file   Social Determinants of Health   Financial Resource Strain:   . Difficulty of Paying Living Expenses: Not on file  Food Insecurity:   . Worried About Charity fundraiser in the Last Year: Not on file  . Ran Out of Food in the Last Year: Not on file  Transportation Needs:   . Lack of Transportation (Medical): Not on file  . Lack of Transportation (Non-Medical): Not on file  Physical Activity:   . Days of Exercise per Week: Not on file  . Minutes of Exercise per Session: Not on file  Stress:   . Feeling of Stress : Not on file  Social Connections:   . Frequency of Communication with Friends and Family: Not on file  . Frequency of Social Gatherings with Friends and Family: Not on file  . Attends Religious Services: Not on file  . Active Member of Clubs or Organizations: Not on file  . Attends English as a second language teacher Meetings: Not on file  . Marital Status: Not on file  Intimate Partner Violence:   . Fear of Current or Ex-Partner: Not on file  . Emotionally Abused: Not on file  . Physically Abused: Not on file  . Sexually Abused: Not on file    Outpatient Medications Prior to Visit  Medication Sig Dispense Refill  . Ascorbic Acid (VITAMIN C PO) Take 100 mg by mouth.    Marland Kitchen buPROPion (WELLBUTRIN XL) 150 MG 24 hr tablet Take 1 tablet (150 mg total) by mouth every morning. 90 tablet 3  . meloxicam (MOBIC) 15 MG tablet One tab PO qAM with a meal for 2 weeks, then daily prn pain. 30 tablet 3  . Multiple Vitamins-Minerals (ZINC PO) Take 50 mg by mouth.    . TESTOSTERONE CYPIONATE IM Inject 100 mg into the muscle every 7 (seven) days.    Marland Kitchen VITAMIN D PO Take 50 mg by mouth.     No facility-administered medications prior to visit.    No Known Allergies  ROS Review of Systems    Objective:    Physical Exam  BP 120/63   Pulse 87   SpO2 100%  Wt Readings from Last 3 Encounters:  12/22/19 216 lb (  98 kg)  12/17/19 217 lb (98.4 kg)  12/15/19 217 lb 1.3 oz (98.5 kg)     Health Maintenance Due  Topic Date Due  . Hepatitis C Screening  Never done  . HIV Screening  Never done    There are no preventive care reminders to display for this patient.  No results found for: TSH Lab Results  Component Value Date   WBC 6.1 09/11/2019   HGB 15.0 09/11/2019   HCT 44.6 09/11/2019   MCV 88.3 09/11/2019   PLT 350 09/11/2019   Lab Results  Component Value Date   NA 142 09/11/2019   K 4.1 09/11/2019   CO2 33 (H) 09/11/2019   GLUCOSE 87 09/11/2019   BUN 9 09/11/2019   CREATININE 0.86 09/11/2019   BILITOT 0.8 09/11/2019   AST 15 09/11/2019   ALT 21 09/11/2019   PROT 6.7 09/11/2019   CALCIUM 9.2 09/11/2019   No results found for: CHOL No results found for: HDL No results found for: LDLCALC No results found for: TRIG No results found for: CHOLHDL No results found for:  HGBA1C    Assessment & Plan:  Testosterone injection - Patient tolerated injection well without complications. Patient advised to schedule next injection 7 days from today.    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 testosterone cypionate (DEPOTESTOSTERONE CYPIONATE) injection 100 mg    Follow-up: Return in about 1 week (around 01/12/2020) for testosterone injection. [name], [name], [place]

## 2020-01-13 ENCOUNTER — Ambulatory Visit (INDEPENDENT_AMBULATORY_CARE_PROVIDER_SITE_OTHER): Payer: BC Managed Care – PPO | Admitting: Sports Medicine

## 2020-01-13 ENCOUNTER — Other Ambulatory Visit: Payer: Self-pay

## 2020-01-13 DIAGNOSIS — F640 Transsexualism: Secondary | ICD-10-CM

## 2020-01-13 DIAGNOSIS — Z79899 Other long term (current) drug therapy: Secondary | ICD-10-CM | POA: Diagnosis not present

## 2020-01-13 DIAGNOSIS — G8929 Other chronic pain: Secondary | ICD-10-CM | POA: Diagnosis not present

## 2020-01-13 DIAGNOSIS — IMO0001 Reserved for inherently not codable concepts without codable children: Secondary | ICD-10-CM

## 2020-01-13 DIAGNOSIS — M25571 Pain in right ankle and joints of right foot: Secondary | ICD-10-CM

## 2020-01-13 MED ORDER — TESTOSTERONE CYPIONATE 200 MG/ML IM SOLN
100.0000 mg | Freq: Once | INTRAMUSCULAR | Status: AC
  Administered 2020-01-13: 100 mg via INTRAMUSCULAR

## 2020-01-13 NOTE — Assessment & Plan Note (Signed)
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 He got some custom orthotics which seem to have worked Recruitment consultant, he has almost no pain, does not really need the brace all that much, not using the NSAIDs very much anymore. Has not plateaued in terms of relief, can return to see me as needed. Next step would be injection/MRI.

## 2020-01-13 NOTE — Progress Notes (Signed)
    Procedures performed today:    None.  Independent interpretation of notes and tests performed by another provider:   None.  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 He got some custom orthotics which seem to have worked Recruitment consultant, he has almost no pain, does not really need the brace all that much, not using the NSAIDs very much anymore. Has not plateaued in terms of relief, can return to see me as needed. Next step would be injection/MRI.    ___________________________________________ Gwen Her. Dianah Field, M.D., ABFM., CAQSM. Primary Care and [place] In[employer] of [employer] [facility]

## 2020-01-19 ENCOUNTER — Ambulatory Visit (INDEPENDENT_AMBULATORY_CARE_PROVIDER_SITE_OTHER): Payer: BC Managed Care – PPO | Admitting: Osteopathic Medicine

## 2020-01-19 ENCOUNTER — Other Ambulatory Visit: Payer: Self-pay

## 2020-01-19 VITALS — BP 121/71 | HR 91

## 2020-01-19 DIAGNOSIS — F640 Transsexualism: Secondary | ICD-10-CM | POA: Diagnosis not present

## 2020-01-19 DIAGNOSIS — Z79899 Other long term (current) drug therapy: Secondary | ICD-10-CM

## 2020-01-19 DIAGNOSIS — IMO0001 Reserved for inherently not codable concepts without codable children: Secondary | ICD-10-CM

## 2020-01-19 MED ORDER — TESTOSTERONE CYPIONATE 200 MG/ML IM SOLN
100.0000 mg | Freq: Once | INTRAMUSCULAR | Status: AC
  Administered 2020-01-19: 100 mg via INTRAMUSCULAR

## 2020-01-19 NOTE — Progress Notes (Signed)
Established Patient Office Visit  Subjective:  Patient ID: Victoria Hill, adult    DOB: 1985/04/18  Age: 34 y.o. MRN: [id number]  CC:  Chief Complaint  Patient presents with  . Injections    Testosterone    HPI Victoria Hill is here for a testosterone injection. Denies chest pain, shortness of breath, headaches or mood changes.   Past Medical History:  Diagnosis Date  . Anxiety   . Depression     Past Surgical History:  Procedure Laterality Date  . CHEST SURGERY      History reviewed. No pertinent family history.  Social History   Socioeconomic History  . Marital status: Soil scientist    Spouse name: Not on file  . Number of children: Not on file  . Years of education: Not on file  . Highest education level: Not on file  Occupational History  . Not on file  Tobacco Use  . Smoking status: Never Smoker  . Smokeless tobacco: Never Used  Vaping Use  . Vaping Use: Never used  Substance and Sexual Activity  . Alcohol use: Not Currently  . Drug use: Never  . Sexual activity: Never    Partners: Female  Other Topics Concern  . Not on file  Social History Narrative  . Not on file   Social Determinants of Health   Financial Resource Strain: Not on file  Food Insecurity: Not on file  Transportation Needs: Not on file  Physical Activity: Not on file  Stress: Not on file  Social Connections: Not on file  Intimate Partner Violence: Not on file    Outpatient Medications Prior to Visit  Medication Sig Dispense Refill  . Ascorbic Acid (VITAMIN C PO) Take 100 mg by mouth.    Marland Kitchen buPROPion (WELLBUTRIN XL) 150 MG 24 hr tablet Take 1 tablet (150 mg total) by mouth every morning. 90 tablet 3  . meloxicam (MOBIC) 15 MG tablet One tab PO qAM with a meal for 2 weeks, then daily prn pain. 30 tablet 3  . Multiple Vitamins-Minerals (ZINC PO) Take 50 mg by mouth.    . TESTOSTERONE CYPIONATE IM Inject 100 mg into the muscle every 7 (seven) days.    Marland Kitchen VITAMIN D PO Take 50 mg by  mouth.     No facility-administered medications prior to visit.    No Known Allergies  ROS Review of Systems    Objective:    Physical Exam  BP 121/71   Pulse 91   SpO2 100%  Wt Readings from Last 3 Encounters:  12/22/19 216 lb (98 kg)  12/17/19 217 lb (98.4 kg)  12/15/19 217 lb 1.3 oz (98.5 kg)     Health Maintenance Due  Topic Date Due  . Hepatitis C Screening  Never done  . HIV Screening  Never done    There are no preventive care reminders to display for this patient.  No results found for: TSH Lab Results  Component Value Date   WBC 6.1 09/11/2019   HGB 15.0 09/11/2019   HCT 44.6 09/11/2019   MCV 88.3 09/11/2019   PLT 350 09/11/2019   Lab Results  Component Value Date   NA 142 09/11/2019   K 4.1 09/11/2019   CO2 33 (H) 09/11/2019   GLUCOSE 87 09/11/2019   BUN 9 09/11/2019   CREATININE 0.86 09/11/2019   BILITOT 0.8 09/11/2019   AST 15 09/11/2019   ALT 21 09/11/2019   PROT 6.7 09/11/2019   CALCIUM 9.2 09/11/2019  No results found for: CHOL No results found for: HDL No results found for: LDLCALC No results found for: TRIG No results found for: CHOLHDL No results found for: HGBA1C    Assessment & Plan:  Testosterone injection - Patient tolerated injection well without complications. Patient advised to schedule next injection 7 days from today.    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 testosterone cypionate (DEPOTESTOSTERONE CYPIONATE) injection 100 mg    Follow-up: Return in about 1 week (around 01/26/2020) for testosterone injection. Victoria Hill, Victoria Hill, [place]

## 2020-01-28 ENCOUNTER — Ambulatory Visit (INDEPENDENT_AMBULATORY_CARE_PROVIDER_SITE_OTHER): Payer: BC Managed Care – PPO | Admitting: Osteopathic Medicine

## 2020-01-28 ENCOUNTER — Other Ambulatory Visit: Payer: Self-pay

## 2020-01-28 VITALS — BP 111/65 | HR 79 | Ht 66.0 in | Wt 211.0 lb

## 2020-01-28 DIAGNOSIS — F640 Transsexualism: Secondary | ICD-10-CM | POA: Diagnosis not present

## 2020-01-28 DIAGNOSIS — Z79899 Other long term (current) drug therapy: Secondary | ICD-10-CM

## 2020-01-28 DIAGNOSIS — IMO0001 Reserved for inherently not codable concepts without codable children: Secondary | ICD-10-CM

## 2020-01-28 MED ORDER — TESTOSTERONE CYPIONATE 200 MG/ML IM SOLN
100.0000 mg | Freq: Once | INTRAMUSCULAR | Status: AC
  Administered 2020-01-28: 100 mg via INTRAMUSCULAR

## 2020-01-28 NOTE — Progress Notes (Signed)
Testosterone 100mg  given RUOQ without immediate complications.

## 2020-02-02 ENCOUNTER — Other Ambulatory Visit: Payer: Self-pay

## 2020-02-02 ENCOUNTER — Ambulatory Visit (INDEPENDENT_AMBULATORY_CARE_PROVIDER_SITE_OTHER): Payer: BC Managed Care – PPO | Admitting: Family Medicine

## 2020-02-02 VITALS — BP 112/64 | HR 83

## 2020-02-02 DIAGNOSIS — F640 Transsexualism: Secondary | ICD-10-CM | POA: Diagnosis not present

## 2020-02-02 DIAGNOSIS — Z79899 Other long term (current) drug therapy: Secondary | ICD-10-CM

## 2020-02-02 DIAGNOSIS — IMO0001 Reserved for inherently not codable concepts without codable children: Secondary | ICD-10-CM

## 2020-02-02 MED ORDER — TESTOSTERONE CYPIONATE 200 MG/ML IM SOLN
100.0000 mg | Freq: Once | INTRAMUSCULAR | Status: AC
  Administered 2020-02-02: 100 mg via INTRAMUSCULAR

## 2020-02-02 NOTE — Progress Notes (Signed)
Established Patient Office Visit  Subjective:  Patient ID: Victoria Hill, adult    DOB: 1985/06/15  Age: 35 y.o. MRN: [id number]  CC:  Chief Complaint  Patient presents with  . Injections    HPI Victoria Hill is here for a testosterone injection. Denies chest pain, shortness of breath, headaches or mood changes.    Past Medical History:  Diagnosis Date  . Anxiety   . Depression     Past Surgical History:  Procedure Laterality Date  . CHEST SURGERY      History reviewed. No pertinent family history.  Social History   Socioeconomic History  . Marital status: Media planner    Spouse name: Not on file  . Number of children: Not on file  . Years of education: Not on file  . Highest education level: Not on file  Occupational History  . Not on file  Tobacco Use  . Smoking status: Never Smoker  . Smokeless tobacco: Never Used  Vaping Use  . Vaping Use: Never used  Substance and Sexual Activity  . Alcohol use: Not Currently  . Drug use: Never  . Sexual activity: Never    Partners: Female  Other Topics Concern  . Not on file  Social History Narrative  . Not on file   Social Determinants of Health   Financial Resource Strain: Not on file  Food Insecurity: Not on file  Transportation Needs: Not on file  Physical Activity: Not on file  Stress: Not on file  Social Connections: Not on file  Intimate Partner Violence: Not on file    Outpatient Medications Prior to Visit  Medication Sig Dispense Refill  . Ascorbic Acid (VITAMIN C PO) Take 100 mg by mouth.    Marland Kitchen buPROPion (WELLBUTRIN XL) 150 MG 24 hr tablet Take 1 tablet (150 mg total) by mouth every morning. 90 tablet 3  . meloxicam (MOBIC) 15 MG tablet One tab PO qAM with a meal for 2 weeks, then daily prn pain. 30 tablet 3  . Multiple Vitamins-Minerals (ZINC PO) Take 50 mg by mouth.    . TESTOSTERONE CYPIONATE IM Inject 100 mg into the muscle every 7 (seven) days.    Marland Kitchen VITAMIN D PO Take 50 mg by mouth.     No  facility-administered medications prior to visit.    No Known Allergies  ROS Review of Systems    Objective:    Physical Exam  BP 112/64   Pulse 83   SpO2 100%  Wt Readings from Last 3 Encounters:  01/28/20 211 lb (95.7 kg)  12/22/19 216 lb (98 kg)  12/17/19 217 lb (98.4 kg)     Health Maintenance Due  Topic Date Due  . Hepatitis C Screening  Never done  . HIV Screening  Never done    There are no preventive care reminders to display for this patient.  No results found for: TSH Lab Results  Component Value Date   WBC 6.1 09/11/2019   HGB 15.0 09/11/2019   HCT 44.6 09/11/2019   MCV 88.3 09/11/2019   PLT 350 09/11/2019   Lab Results  Component Value Date   NA 142 09/11/2019   K 4.1 09/11/2019   CO2 33 (H) 09/11/2019   GLUCOSE 87 09/11/2019   BUN 9 09/11/2019   CREATININE 0.86 09/11/2019   BILITOT 0.8 09/11/2019   AST 15 09/11/2019   ALT 21 09/11/2019   PROT 6.7 09/11/2019   CALCIUM 9.2 09/11/2019   No results found for: CHOL  No results found for: HDL No results found for: LDLCALC No results found for: TRIG No results found for: CHOLHDL No results found for: HGBA1C    Assessment & Plan:  Testosterone injection - Patient tolerated injection well without complications. Patient advised to schedule next injection 7 days from today.    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 testosterone cypionate (DEPOTESTOSTERONE CYPIONATE) injection 100 mg    Follow-up: Return in about 1 week (around 02/09/2020) for testosterone injection. [name], [name], [place]

## 2020-02-02 NOTE — Progress Notes (Signed)
Agree with documentation as above.   Marissa Weaver, MD  

## 2020-02-09 ENCOUNTER — Ambulatory Visit (INDEPENDENT_AMBULATORY_CARE_PROVIDER_SITE_OTHER): Payer: BC Managed Care – PPO | Admitting: Osteopathic Medicine

## 2020-02-09 VITALS — BP 129/77 | HR 89

## 2020-02-09 DIAGNOSIS — Z79899 Other long term (current) drug therapy: Secondary | ICD-10-CM

## 2020-02-09 DIAGNOSIS — F640 Transsexualism: Secondary | ICD-10-CM

## 2020-02-09 DIAGNOSIS — IMO0001 Reserved for inherently not codable concepts without codable children: Secondary | ICD-10-CM

## 2020-02-09 MED ORDER — TESTOSTERONE CYPIONATE 200 MG/ML IM SOLN
100.0000 mg | Freq: Once | INTRAMUSCULAR | Status: AC
  Administered 2020-02-09: 100 mg via INTRAMUSCULAR

## 2020-02-09 NOTE — Progress Notes (Signed)
Established Patient Office Visit  Subjective:  Patient ID: Victoria Hill, adult    DOB: [birth year]  Age: 35 y.o. MRN: [id number]  CC:  Chief Complaint  Patient presents with  . Injections    Testosterone    HPI Victoria Hill is here for a testosterone injection. Denies chest pain, shortness of breath, headaches or mood changes.    Past Medical History:  Diagnosis Date  . Anxiety   . Depression     Past Surgical History:  Procedure Laterality Date  . CHEST SURGERY      History reviewed. No pertinent family history.  Social History   Socioeconomic History  . Marital status: Soil scientist    Spouse name: Not on file  . Number of children: Not on file  . Years of education: Not on file  . Highest education level: Not on file  Occupational History  . Not on file  Tobacco Use  . Smoking status: Never Smoker  . Smokeless tobacco: Never Used  Vaping Use  . Vaping Use: Never used  Substance and Sexual Activity  . Alcohol use: Not Currently  . Drug use: Never  . Sexual activity: Never    Partners: Female  Other Topics Concern  . Not on file  Social History Narrative  . Not on file   Social Determinants of Health   Financial Resource Strain: Not on file  Food Insecurity: Not on file  Transportation Needs: Not on file  Physical Activity: Not on file  Stress: Not on file  Social Connections: Not on file  Intimate Partner Violence: Not on file    Outpatient Medications Prior to Visit  Medication Sig Dispense Refill  . Ascorbic Acid (VITAMIN C PO) Take 100 mg by mouth.    Marland Kitchen buPROPion (WELLBUTRIN XL) 150 MG 24 hr tablet Take 1 tablet (150 mg total) by mouth every morning. 90 tablet 3  . meloxicam (MOBIC) 15 MG tablet One tab PO qAM with a meal for 2 weeks, then daily prn pain. 30 tablet 3  . Multiple Vitamins-Minerals (ZINC PO) Take 50 mg by mouth.    . TESTOSTERONE CYPIONATE IM Inject 100 mg into the muscle every 7 (seven) days.    Marland Kitchen VITAMIN D PO Take 50 mg by  mouth.     No facility-administered medications prior to visit.    No Known Allergies  ROS Review of Systems    Objective:    Physical Exam  BP 129/77   Pulse 89   SpO2 100%  Wt Readings from Last 3 Encounters:  01/28/20 211 lb (95.7 kg)  12/22/19 216 lb (98 kg)  12/17/19 217 lb (98.4 kg)     Health Maintenance Due  Topic Date Due  . Hepatitis C Screening  Never done  . HIV Screening  Never done    There are no preventive care reminders to display for this patient.  No results found for: TSH Lab Results  Component Value Date   WBC 6.1 09/11/2019   HGB 15.0 09/11/2019   HCT 44.6 09/11/2019   MCV 88.3 09/11/2019   PLT 350 09/11/2019   Lab Results  Component Value Date   NA 142 09/11/2019   K 4.1 09/11/2019   CO2 33 (H) 09/11/2019   GLUCOSE 87 09/11/2019   BUN 9 09/11/2019   CREATININE 0.86 09/11/2019   BILITOT 0.8 09/11/2019   AST 15 09/11/2019   ALT 21 09/11/2019   PROT 6.7 09/11/2019   CALCIUM 9.2 09/11/2019   No  results found for: CHOL No results found for: HDL No results found for: LDLCALC No results found for: TRIG No results found for: CHOLHDL No results found for: HGBA1C    Assessment & Plan:  Patient tolerated injection well without complications. Patient advised to schedule next injection 7 days from today.    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 testosterone cypionate (DEPOTESTOSTERONE CYPIONATE) injection 100 mg    Follow-up: Return in about 1 week (around 02/16/2020) for testosterone injection. [name], [name], [place]

## 2020-02-17 ENCOUNTER — Ambulatory Visit: Payer: BC Managed Care – PPO

## 2020-02-18 ENCOUNTER — Other Ambulatory Visit: Payer: Self-pay

## 2020-02-18 ENCOUNTER — Ambulatory Visit (INDEPENDENT_AMBULATORY_CARE_PROVIDER_SITE_OTHER): Payer: BC Managed Care – PPO | Admitting: Osteopathic Medicine

## 2020-02-18 VITALS — BP 120/79 | HR 98 | Wt 209.0 lb

## 2020-02-18 DIAGNOSIS — Z79899 Other long term (current) drug therapy: Secondary | ICD-10-CM | POA: Diagnosis not present

## 2020-02-18 DIAGNOSIS — F640 Transsexualism: Secondary | ICD-10-CM | POA: Diagnosis not present

## 2020-02-18 DIAGNOSIS — IMO0001 Reserved for inherently not codable concepts without codable children: Secondary | ICD-10-CM

## 2020-02-18 MED ORDER — TESTOSTERONE CYPIONATE 200 MG/ML IM SOLN
100.0000 mg | Freq: Once | INTRAMUSCULAR | Status: AC
  Administered 2020-02-18: 100 mg via INTRAMUSCULAR

## 2020-02-18 NOTE — Progress Notes (Signed)
   Subjective:    Patient ID: Victoria Hill, adult    DOB: 12/14/1985, 35 y.o.   MRN: [id number]  HPI Patient is here for a testosterone injection. Denies chest pain, shortness of breath, headaches and problems with medications or mood changes.    Review of Systems     Objective:   Physical Exam        Assessment & Plan:  Patient tolerated injection well without complications. Patient advised to achedule next injection in 7 days.

## 2020-02-23 ENCOUNTER — Other Ambulatory Visit: Payer: Self-pay

## 2020-02-23 ENCOUNTER — Ambulatory Visit (INDEPENDENT_AMBULATORY_CARE_PROVIDER_SITE_OTHER): Payer: BC Managed Care – PPO | Admitting: Medical-Surgical

## 2020-02-23 VITALS — BP 129/70 | HR 83 | Temp 98.0°F | Wt 210.0 lb

## 2020-02-23 DIAGNOSIS — Z79899 Other long term (current) drug therapy: Secondary | ICD-10-CM

## 2020-02-23 DIAGNOSIS — Z789 Other specified health status: Secondary | ICD-10-CM | POA: Diagnosis not present

## 2020-02-23 DIAGNOSIS — F640 Transsexualism: Secondary | ICD-10-CM

## 2020-02-23 DIAGNOSIS — IMO0001 Reserved for inherently not codable concepts without codable children: Secondary | ICD-10-CM

## 2020-02-23 MED ORDER — TESTOSTERONE CYPIONATE 200 MG/ML IM SOLN
100.0000 mg | Freq: Once | INTRAMUSCULAR | Status: AC
  Administered 2020-02-23: 100 mg via INTRAMUSCULAR

## 2020-02-23 NOTE — Progress Notes (Signed)
Pt is here for a testosterone injection. Denies chest pains, SOB, headaches and problems with medication or mood changes. Pt tolerated injection well on RUOQ without complications. Pt advised to schedule next injection in 1 week.

## 2020-03-01 ENCOUNTER — Ambulatory Visit (INDEPENDENT_AMBULATORY_CARE_PROVIDER_SITE_OTHER): Payer: BC Managed Care – PPO | Admitting: Osteopathic Medicine

## 2020-03-01 ENCOUNTER — Other Ambulatory Visit: Payer: Self-pay

## 2020-03-01 VITALS — BP 120/76 | HR 83

## 2020-03-01 DIAGNOSIS — Z79899 Other long term (current) drug therapy: Secondary | ICD-10-CM | POA: Diagnosis not present

## 2020-03-01 DIAGNOSIS — F640 Transsexualism: Secondary | ICD-10-CM

## 2020-03-01 DIAGNOSIS — IMO0001 Reserved for inherently not codable concepts without codable children: Secondary | ICD-10-CM

## 2020-03-01 MED ORDER — TESTOSTERONE CYPIONATE 200 MG/ML IM SOLN
100.0000 mg | Freq: Once | INTRAMUSCULAR | Status: AC
  Administered 2020-03-01: 100 mg via INTRAMUSCULAR

## 2020-03-01 NOTE — Progress Notes (Signed)
Established Patient Office Visit  Subjective:  Patient ID: [name], adult    DOB: 20-Jul-1985  Age: 35 y.o. MRN: [id number]  CC:  Chief Complaint  Patient presents with  . Injections    HPI Victoria Hill is here for a testosterone injection. Denies chest pain, shortness of breath, headaches or mood changes.   Past Medical History:  Diagnosis Date  . Anxiety   . Depression     Past Surgical History:  Procedure Laterality Date  . CHEST SURGERY      History reviewed. No pertinent family history.  Social History   Socioeconomic History  . Marital status: Soil scientist    Spouse name: Not on file  . Number of children: Not on file  . Years of education: Not on file  . Highest education level: Not on file  Occupational History  . Not on file  Tobacco Use  . Smoking status: Never Smoker  . Smokeless tobacco: Never Used  Vaping Use  . Vaping Use: Never used  Substance and Sexual Activity  . Alcohol use: Not Currently  . Drug use: Never  . Sexual activity: Never    Partners: Female  Other Topics Concern  . Not on file  Social History Narrative  . Not on file   Social Determinants of Health   Financial Resource Strain: Not on file  Food Insecurity: Not on file  Transportation Needs: Not on file  Physical Activity: Not on file  Stress: Not on file  Social Connections: Not on file  Intimate Partner Violence: Not on file    Outpatient Medications Prior to Visit  Medication Sig Dispense Refill  . Ascorbic Acid (VITAMIN C PO) Take 100 mg by mouth.    Marland Kitchen buPROPion (WELLBUTRIN XL) 150 MG 24 hr tablet Take 1 tablet (150 mg total) by mouth every morning. 90 tablet 3  . meloxicam (MOBIC) 15 MG tablet One tab PO qAM with a meal for 2 weeks, then daily prn pain. 30 tablet 3  . Multiple Vitamins-Minerals (ZINC PO) Take 50 mg by mouth.    . TESTOSTERONE CYPIONATE IM Inject 100 mg into the muscle every 7 (seven) days.    Marland Kitchen VITAMIN D PO Take 50 mg by mouth.     No  facility-administered medications prior to visit.    No Known Allergies  ROS Review of Systems    Objective:    Physical Exam  BP 120/76   Pulse 83   SpO2 100%  Wt Readings from Last 3 Encounters:  02/23/20 210 lb (95.3 kg)  02/18/20 209 lb (94.8 kg)  01/28/20 211 lb (95.7 kg)     Health Maintenance Due  Topic Date Due  . Hepatitis C Screening  Never done  . HIV Screening  Never done    There are no preventive care reminders to display for this patient.  No results found for: TSH Lab Results  Component Value Date   WBC 6.1 09/11/2019   HGB 15.0 09/11/2019   HCT 44.6 09/11/2019   MCV 88.3 09/11/2019   PLT 350 09/11/2019   Lab Results  Component Value Date   NA 142 09/11/2019   K 4.1 09/11/2019   CO2 33 (H) 09/11/2019   GLUCOSE 87 09/11/2019   BUN 9 09/11/2019   CREATININE 0.86 09/11/2019   BILITOT 0.8 09/11/2019   AST 15 09/11/2019   ALT 21 09/11/2019   PROT 6.7 09/11/2019   CALCIUM 9.2 09/11/2019   No results found for: CHOL No  results found for: HDL No results found for: LDLCALC No results found for: TRIG No results found for: CHOLHDL No results found for: HGBA1C    Assessment & Plan:  Testosterone injection - Patient tolerated injection well without complications. Patient advised to schedule next injection 7 days from today.    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 testosterone cypionate (DEPOTESTOSTERONE CYPIONATE) injection 100 mg    Follow-up: Return in about 1 week (around 03/08/2020) for testosterone injection. [name], [name], [place]

## 2020-03-08 ENCOUNTER — Other Ambulatory Visit: Payer: Self-pay

## 2020-03-08 ENCOUNTER — Ambulatory Visit (INDEPENDENT_AMBULATORY_CARE_PROVIDER_SITE_OTHER): Payer: BC Managed Care – PPO | Admitting: Osteopathic Medicine

## 2020-03-08 VITALS — BP 99/67 | HR 94

## 2020-03-08 DIAGNOSIS — Z79899 Other long term (current) drug therapy: Secondary | ICD-10-CM | POA: Diagnosis not present

## 2020-03-08 DIAGNOSIS — IMO0001 Reserved for inherently not codable concepts without codable children: Secondary | ICD-10-CM

## 2020-03-08 DIAGNOSIS — F640 Transsexualism: Secondary | ICD-10-CM

## 2020-03-08 MED ORDER — TESTOSTERONE CYPIONATE 200 MG/ML IM SOLN
100.0000 mg | Freq: Once | INTRAMUSCULAR | Status: AC
  Administered 2020-03-08: 100 mg via INTRAMUSCULAR

## 2020-03-08 NOTE — Progress Notes (Signed)
Pt here for testosterone injection.  100mg  given RUOQ without immediate complications.

## 2020-03-15 ENCOUNTER — Other Ambulatory Visit: Payer: Self-pay

## 2020-03-15 ENCOUNTER — Ambulatory Visit (INDEPENDENT_AMBULATORY_CARE_PROVIDER_SITE_OTHER): Payer: BC Managed Care – PPO | Admitting: Osteopathic Medicine

## 2020-03-15 VITALS — BP 106/64 | HR 79

## 2020-03-15 DIAGNOSIS — Z79899 Other long term (current) drug therapy: Secondary | ICD-10-CM

## 2020-03-15 DIAGNOSIS — F640 Transsexualism: Secondary | ICD-10-CM

## 2020-03-15 DIAGNOSIS — IMO0001 Reserved for inherently not codable concepts without codable children: Secondary | ICD-10-CM

## 2020-03-15 MED ORDER — TESTOSTERONE CYPIONATE 200 MG/ML IM SOLN
100.0000 mg | Freq: Once | INTRAMUSCULAR | Status: AC
  Administered 2020-03-15: 100 mg via INTRAMUSCULAR

## 2020-03-15 NOTE — Progress Notes (Signed)
Testosterone given LUOQ without complications.

## 2020-03-22 ENCOUNTER — Other Ambulatory Visit: Payer: Self-pay

## 2020-03-22 ENCOUNTER — Ambulatory Visit (INDEPENDENT_AMBULATORY_CARE_PROVIDER_SITE_OTHER): Payer: BC Managed Care – PPO | Admitting: Osteopathic Medicine

## 2020-03-22 VITALS — BP 112/63 | HR 76

## 2020-03-22 DIAGNOSIS — F640 Transsexualism: Secondary | ICD-10-CM

## 2020-03-22 DIAGNOSIS — Z79899 Other long term (current) drug therapy: Secondary | ICD-10-CM

## 2020-03-22 DIAGNOSIS — IMO0001 Reserved for inherently not codable concepts without codable children: Secondary | ICD-10-CM

## 2020-03-22 MED ORDER — TESTOSTERONE CYPIONATE 200 MG/ML IM SOLN
100.0000 mg | Freq: Once | INTRAMUSCULAR | Status: AC
  Administered 2020-03-22: 100 mg via INTRAMUSCULAR

## 2020-03-22 NOTE — Progress Notes (Signed)
Testosterone given RUOQ without complications.

## 2020-03-30 ENCOUNTER — Ambulatory Visit (INDEPENDENT_AMBULATORY_CARE_PROVIDER_SITE_OTHER): Payer: BC Managed Care – PPO | Admitting: Osteopathic Medicine

## 2020-03-30 ENCOUNTER — Other Ambulatory Visit: Payer: Self-pay

## 2020-03-30 VITALS — BP 100/71 | HR 77 | Resp 20

## 2020-03-30 DIAGNOSIS — F640 Transsexualism: Secondary | ICD-10-CM | POA: Diagnosis not present

## 2020-03-30 DIAGNOSIS — Z79899 Other long term (current) drug therapy: Secondary | ICD-10-CM

## 2020-03-30 DIAGNOSIS — IMO0001 Reserved for inherently not codable concepts without codable children: Secondary | ICD-10-CM

## 2020-03-30 MED ORDER — TESTOSTERONE CYPIONATE 200 MG/ML IM SOLN
100.0000 mg | Freq: Once | INTRAMUSCULAR | Status: AC
  Administered 2020-03-30: 100 mg via INTRAMUSCULAR

## 2020-03-30 NOTE — Progress Notes (Signed)
Established Patient Office Visit  Subjective:  Patient ID: Victoria Hill, adult    DOB: Feb 12, 1985  Age: 35 y.o. MRN: [id number]  CC:  Chief Complaint  Patient presents with  . hormone therapy     HPI Victoria Hill presents for Testosterone injection.  Past Medical History:  Diagnosis Date  . Anxiety   . Depression     Past Surgical History:  Procedure Laterality Date  . CHEST SURGERY      No family history on file.  Social History   Socioeconomic History  . Marital status: Soil scientist    Spouse name: Not on file  . Number of children: Not on file  . Years of education: Not on file  . Highest education level: Not on file  Occupational History  . Not on file  Tobacco Use  . Smoking status: Never Smoker  . Smokeless tobacco: Never Used  Vaping Use  . Vaping Use: Never used  Substance and Sexual Activity  . Alcohol use: Not Currently  . Drug use: Never  . Sexual activity: Never    Partners: Female  Other Topics Concern  . Not on file  Social History Narrative  . Not on file   Social Determinants of Health   Financial Resource Strain: Not on file  Food Insecurity: Not on file  Transportation Needs: Not on file  Physical Activity: Not on file  Stress: Not on file  Social Connections: Not on file  Intimate Partner Violence: Not on file    Outpatient Medications Prior to Visit  Medication Sig Dispense Refill  . Ascorbic Acid (VITAMIN C PO) Take 100 mg by mouth.    Marland Kitchen buPROPion (WELLBUTRIN XL) 150 MG 24 hr tablet Take 1 tablet (150 mg total) by mouth every morning. 90 tablet 3  . meloxicam (MOBIC) 15 MG tablet One tab PO qAM with a meal for 2 weeks, then daily prn pain. 30 tablet 3  . Multiple Vitamins-Minerals (ZINC PO) Take 50 mg by mouth.    . TESTOSTERONE CYPIONATE IM Inject 100 mg into the muscle every 7 (seven) days.    Marland Kitchen VITAMIN D PO Take 50 mg by mouth.     No facility-administered medications prior to visit.    No Known  Allergies  ROS Review of Systems    Objective:    Physical Exam  BP 100/71 (BP Location: Left Arm, Patient Position: Sitting, Cuff Size: Normal)   Pulse 77   Resp 20   SpO2 98%  Wt Readings from Last 3 Encounters:  02/23/20 210 lb (95.3 kg)  02/18/20 209 lb (94.8 kg)  01/28/20 211 lb (95.7 kg)     Health Maintenance Due  Topic Date Due  . Hepatitis C Screening  Never done  . HIV Screening  Never done    There are no preventive care reminders to display for this patient.  No results found for: TSH Lab Results  Component Value Date   WBC 6.1 09/11/2019   HGB 15.0 09/11/2019   HCT 44.6 09/11/2019   MCV 88.3 09/11/2019   PLT 350 09/11/2019   Lab Results  Component Value Date   NA 142 09/11/2019   K 4.1 09/11/2019   CO2 33 (H) 09/11/2019   GLUCOSE 87 09/11/2019   BUN 9 09/11/2019   CREATININE 0.86 09/11/2019   BILITOT 0.8 09/11/2019   AST 15 09/11/2019   ALT 21 09/11/2019   PROT 6.7 09/11/2019   CALCIUM 9.2 09/11/2019   No results found for:  CHOL No results found for: HDL No results found for: LDLCALC No results found for: TRIG No results found for: CHOLHDL No results found for: HGBA1C    Assessment & Plan:  Pt tolerated injection well with no complications. Return in one week.  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 testosterone cypionate (DEPOTESTOSTERONE CYPIONATE) injection 100 mg    Follow-up: Return in about 1 week (around 04/06/2020) for Testosterone Injection .    [name], CMA

## 2020-04-05 ENCOUNTER — Other Ambulatory Visit: Payer: Self-pay

## 2020-04-05 ENCOUNTER — Ambulatory Visit (INDEPENDENT_AMBULATORY_CARE_PROVIDER_SITE_OTHER): Payer: BC Managed Care – PPO

## 2020-04-05 ENCOUNTER — Ambulatory Visit (INDEPENDENT_AMBULATORY_CARE_PROVIDER_SITE_OTHER): Payer: BC Managed Care – PPO | Admitting: Sports Medicine

## 2020-04-05 DIAGNOSIS — E291 Testicular hypofunction: Secondary | ICD-10-CM

## 2020-04-05 DIAGNOSIS — F640 Transsexualism: Secondary | ICD-10-CM

## 2020-04-05 DIAGNOSIS — S93331A Other subluxation of right foot, initial encounter: Secondary | ICD-10-CM

## 2020-04-05 DIAGNOSIS — Z79899 Other long term (current) drug therapy: Secondary | ICD-10-CM | POA: Diagnosis not present

## 2020-04-05 DIAGNOSIS — M7731 Calcaneal spur, right foot: Secondary | ICD-10-CM | POA: Diagnosis not present

## 2020-04-05 DIAGNOSIS — IMO0001 Reserved for inherently not codable concepts without codable children: Secondary | ICD-10-CM

## 2020-04-05 MED ORDER — MELOXICAM 15 MG PO TABS: ORAL_TABLET | ORAL | 3 refills | Status: DC

## 2020-04-05 MED ORDER — TESTOSTERONE CYPIONATE 200 MG/ML IM SOLN
100.0000 mg | Freq: Once | INTRAMUSCULAR | Status: AC
  Administered 2020-04-05: 100 mg via INTRAMUSCULAR

## 2020-04-05 NOTE — Assessment & Plan Note (Signed)
Victoria Hill suffered a peroneal subluxation recently, he had immediate pain behind the lateral malleolus. Swelling is not too bad, good motion, good strength, but reproduction of pain with resisted eversion. Unfortunately he will need a boot for 3 to 4 weeks, peroneal rehab, x-rays, meloxicam. Return to see me in 6 weeks, he will do 3 to 4 weeks of boot immobilization followed by rehabilitation exercises before seeing me back.

## 2020-04-05 NOTE — Patient Instructions (Addendum)
Peroneal Tendon Subluxation and Dislocation  The peroneal tendons are strong bands of tissue that connect a bone in the foot to the muscles that let you turn your feet outward and stand on your toes (peroneal muscles). The tendons pass under the ankle and into a groove of bone. Peroneal tendon subluxation and dislocation are injuries that happen when the peroneal tendons move out of this groove. The injuries can make your ankle weak and cause your ankle and foot to hurt. What are the causes? This condition may be caused by:  A severe ankle sprain.  Wear and tear from repeated ankle stress (overuse). What increases the risk? You are more likely to develop this condition if you participate in sports or activities in which there is a risk of spraining your ankle or having your foot be forcefully pushed up and in. These include:  Skiing.  Hockey.  Ice skating.  Football.  Soccer.  Basketball.  Gymnastics. What are the signs or symptoms? Symptoms of this condition may start suddenly or develop gradually. Symptoms include:  Pain in the back of the ankle.  A feeling or sound like a snap or pop in the ankle.  Swelling or bruising in the ankle.  A weak and wobbly ankle or a feeling that your ankle is giving way (ankle instability).  Tenderness in the ankle. How is this diagnosed? This condition may be diagnosed based on:  Your symptoms.  Your medical history.  A physical exam.  Tests, such as: ? An X-ray or a CT scan. These may be done to check your ankle bone. ? An MRI or an ultrasound. These may be done to check your tendon. How is this treated? Treatment for a subluxation injury may include:  Keeping your body weight off your ankle for up to 6 weeks.  Wearing a boot or cast to support your ankle and keep it still.  Applying ice to your ankle to reduce swelling.  Taking anti-inflammatory pain medicine.  Having medicines injected into your tendon to reduce  swelling.  Doing exercises to improve movement and strength (physical therapy).  Having surgery. This may be needed if the injury comes back or does not get better. Treatment for a dislocation injury usually involves surgery to:  Repair any tears in the tissue that holds your tendons in place.  Create a groove behind the ankle.  Secure the tendons in place with stitches (sutures), screws, or pins. Follow these instructions at home: Medicines  Take over-the-counter and prescription medicines only as told by your health care provider.  Ask your health care provider if the medicine prescribed to you requires you to avoid driving or using heavy machinery. If you have a boot:  Wear it as told by your health care provider. Remove it only as told by your health care provider.  Loosen it if your toes tingle, become numb, or turn cold and blue.  Keep it clean and dry. If you have a cast:  Do not put pressure on any part of the cast until it is fully hardened. This may take several hours.  Do not stick anything inside the cast to scratch your skin. Doing that increases your risk of infection.  Check the skin around the cast every day. Tell your health care provider about any concerns.  You may put lotion on dry skin around the edges of the cast. Do not put lotion on the skin underneath the cast.  Keep the cast clean and dry. Bathing  Do  not take baths, swim, or use a hot tub until your health care provider approves. Ask your health care provider if you may take showers.  If the boot or cast is not waterproof: ? Do not let it get wet. ? Cover it with a watertight covering when you take a bath or shower. Managing pain, stiffness, and swelling  If directed, put ice on the injured area. ? Put ice in a plastic bag. ? Place a towel between your skin and the bag. ? Leave the ice on for 20 minutes, 2-3 times a day.  Move your toes often to reduce stiffness and swelling.  Raise  (elevate) the injured area above the level of your heart while you are sitting or lying down.   Activity  Do not use your leg to support your body weight until your health care provider says that you can. Use crutches or a walker as told by your health care provider.  Do not do any activities that make pain or swelling worse.  Do exercises as told by your health care provider.  Return to your normal activities as told by your health care provider. Ask your health care provider what activities are safe for you. General instructions  Ask your health care provider when it is safe to drive if you have a boot or cast on your leg.  Do not use any products that contain nicotine or tobacco, such as cigarettes, e-cigarettes, and chewing tobacco. These can delay healing. If you need help quitting, ask your health care provider.  Keep all follow-up visits as told by your health care provider. This is important. How is this prevented?  Wear supportive footwear that is appropriate for your athletic activity.  Avoid activities that cause pain or swelling in your ankle or foot.  See your health care provider if you have pain or swelling that does not improve after a few days of rest.  Stop training if you develop pain or swelling.  If you start a new athletic activity, start gradually so you can build up your strength and flexibility. Contact a health care provider if:  Your symptoms get worse.  Your symptoms do not improve in 2-4 weeks.  You have new, unexplained symptoms.  Your boot or cast becomes loose or damaged. Summary  Peroneal tendon subluxation and dislocation are injuries that happen when the strong bands of tissue that connect a muscle to a bone in the foot (peroneal tendons) move out of place.  The injuries can make your ankle weak and cause your ankle and foot to hurt.  This condition is caused by a severe ankle sprain or overuse of your foot and ankle.  Treatment includes  rest, ice, medicines, exercises to improve movement and strength (physical therapy), and surgery if needed. This information is not intended to replace advice given to you by your health care provider. Make sure you discuss any questions you have with your health care provider. Document Revised: 04/05/2018 Document Reviewed: 04/05/2018 Elsevier Patient Education  2021 [employer].   Testosterone injection What is this medicine? TESTOSTERONE (tes TOS ter one) is the main female hormone. It supports normal female development such as muscle growth, facial hair, and deep voice. It is used in males to treat low testosterone levels. This medicine may be used for other purposes; ask your health care provider or pharmacist if you have questions. COMMON BRAND NAME(S): Andro-L.A., Aveed, Delatestryl, Depo-Testosterone, Virilon What should I tell my health care provider before I take  this medicine? They need to know if you have any of these conditions:  cancer  diabetes  heart disease  kidney disease  liver disease  lung disease  prostate disease  an unusual or allergic reaction to testosterone, other medicines, foods, dyes, or preservatives  pregnant or trying to get pregnant  breast-feeding How should I use this medicine? This medicine is for injection into a muscle. It is usually given by a health care professional in a hospital or clinic setting. Contact your pediatrician regarding the use of this medicine in children. While this medicine may be prescribed for children as young as 43 years of age for selected conditions, precautions do apply. Overdosage: If you think you have taken too much of this medicine contact a poison control center or emergency room at once. NOTE: This medicine is only for you. Do not share this medicine with others. What if I miss a dose? Try not to miss a dose. Your doctor or health care professional will tell you when your next injection is due. Notify the  office if you are unable to keep an appointment. What may interact with this medicine?  medicines for diabetes  medicines that treat or prevent blood clots like warfarin  oxyphenbutazone  propranolol  steroid medicines like prednisone or cortisone This list may not describe all possible interactions. Give your health care provider a list of all the medicines, herbs, non-prescription drugs, or dietary supplements you use. Also tell them if you smoke, drink alcohol, or use illegal drugs. Some items may interact with your medicine. What should I watch for while using this medicine? Visit your doctor or health care professional for regular checks on your progress. They will need to check the level of testosterone in your blood. This medicine is only approved for use in men who have low levels of testosterone related to certain medical conditions. Heart attacks and strokes have been reported with the use of this medicine. Notify your doctor or health care professional and seek emergency treatment if you develop breathing problems; changes in vision; confusion; chest pain or chest tightness; sudden arm pain; severe, sudden headache; trouble speaking or understanding; sudden numbness or weakness of the face, arm or leg; loss of balance or coordination. Talk to your doctor about the risks and benefits of this medicine. This medicine may affect blood sugar levels. If you have diabetes, check with your doctor or health care professional before you change your diet or the dose of your diabetic medicine. Testosterone injections are not commonly used in women. Women should inform their doctor if they wish to become pregnant or think they might be pregnant. There is a potential for serious side effects to an unborn child. Talk to your health care professional or pharmacist for more information. Talk with your doctor or health care professional about your birth control options while taking this medicine. This  drug is banned from use in athletes by most athletic organizations. What side effects may I notice from receiving this medicine? Side effects that you should report to your doctor or health care professional as soon as possible:  allergic reactions like skin rash, itching or hives, swelling of the face, lips, or tongue  breast enlargement  breathing problems  changes in emotions or moods  deep or hoarse voice  irregular menstrual periods  signs and symptoms of liver injury like dark yellow or brown urine; general ill feeling or flu-like symptoms; light-colored stools; loss of appetite; nausea; right upper belly pain; unusually  weak or tired; yellowing of the eyes or skin  stomach pain  swelling of the ankles, feet, hands  too frequent or persistent erections  trouble passing urine or change in the amount of urine Side effects that usually do not require medical attention (report to your doctor or health care professional if they continue or are bothersome):  acne  change in sex drive or performance  facial hair growth  hair loss  headache This list may not describe all possible side effects. Call your doctor for medical advice about side effects. You may report side effects to FDA at 1-800-FDA-1088. Where should I keep my medicine? Keep out of the reach of children. This medicine can be abused. Keep your medicine in a safe place to protect it from theft. Do not share this medicine with anyone. Selling or giving away this medicine is dangerous and against the law. Store at room temperature between 20 and 25 degrees C (68 and 77 degrees F). Do not freeze. Protect from light. Follow the directions for the product you are prescribed. Throw away any unused medicine after the expiration date. NOTE: This sheet is a summary. It may not cover all possible information. If you have questions about this medicine, talk to your doctor, pharmacist, or health care provider.  2021  Elsevier/Gold Standard (2015-02-20 07:33:55)

## 2020-04-05 NOTE — Progress Notes (Signed)
    Procedures performed today:    None.  Independent interpretation of notes and tests performed by another provider:   None.  Brief History, Exam, Impression, and Recommendations:    Subluxation of peroneal tendon of right foot Victoria Hill suffered a peroneal subluxation recently, he had immediate pain behind the lateral malleolus. Swelling is not too bad, good motion, good strength, but reproduction of pain with resisted eversion. Unfortunately he will need a boot for 3 to 4 weeks, peroneal rehab, x-rays, meloxicam. Return to see me in 6 weeks, he will do 3 to 4 weeks of boot immobilization followed by rehabilitation exercises before seeing me back.    ___________________________________________ Gwen Her. Dianah Field, M.D., ABFM., CAQSM. Primary Care and [place] [employer] [facility]

## 2020-04-05 NOTE — Addendum Note (Signed)
Addended by: Ranelle Oyster on: 04/05/2020 11:14 AM   Modules accepted: Orders

## 2020-04-06 ENCOUNTER — Ambulatory Visit: Payer: BC Managed Care – PPO

## 2020-04-13 ENCOUNTER — Ambulatory Visit (INDEPENDENT_AMBULATORY_CARE_PROVIDER_SITE_OTHER): Payer: BC Managed Care – PPO | Admitting: Osteopathic Medicine

## 2020-04-13 VITALS — BP 111/69 | HR 75 | Temp 98.8°F | Resp 20 | Ht 66.0 in | Wt 210.0 lb

## 2020-04-13 DIAGNOSIS — F640 Transsexualism: Secondary | ICD-10-CM | POA: Diagnosis not present

## 2020-04-13 DIAGNOSIS — IMO0001 Reserved for inherently not codable concepts without codable children: Secondary | ICD-10-CM

## 2020-04-13 DIAGNOSIS — Z79899 Other long term (current) drug therapy: Secondary | ICD-10-CM | POA: Diagnosis not present

## 2020-04-13 MED ORDER — TESTOSTERONE CYPIONATE 200 MG/ML IM SOLN
100.0000 mg | Freq: Once | INTRAMUSCULAR | Status: AC
  Administered 2020-04-13: 100 mg via INTRAMUSCULAR

## 2020-04-13 NOTE — Progress Notes (Signed)
Established Patient Office Visit  Subjective:  Patient ID: Victoria Hill, adult    DOB: [birth year]  Age: 35 y.o. MRN: [id number]  CC:  Chief Complaint  Patient presents with  . Testosterone Injection    HPI Victoria Hill presents for a testosterone injection. Denies chest pain, shortness of breath, headaches, and problems with medication or mood changes.    Patient tolerated testosterone 100 mg injection to LUOQ well without complications. Patient advised to schedule next injection in 1 week.   Past Medical History:  Diagnosis Date  . Anxiety   . Depression     Past Surgical History:  Procedure Laterality Date  . CHEST SURGERY      History reviewed. No pertinent family history.  Social History   Socioeconomic History  . Marital status: Soil scientist    Spouse name: Not on file  . Number of children: Not on file  . Years of education: Not on file  . Highest education level: Not on file  Occupational History  . Not on file  Tobacco Use  . Smoking status: Never Smoker  . Smokeless tobacco: Never Used  Vaping Use  . Vaping Use: Never used  Substance and Sexual Activity  . Alcohol use: Not Currently  . Drug use: Never  . Sexual activity: Never    Partners: Female  Other Topics Concern  . Not on file  Social History Narrative  . Not on file   Social Determinants of Health   Financial Resource Strain: Not on file  Food Insecurity: Not on file  Transportation Needs: Not on file  Physical Activity: Not on file  Stress: Not on file  Social Connections: Not on file  Intimate Partner Violence: Not on file    Outpatient Medications Prior to Visit  Medication Sig Dispense Refill  . Ascorbic Acid (VITAMIN C PO) Take 100 mg by mouth.    Marland Kitchen buPROPion (WELLBUTRIN XL) 150 MG 24 hr tablet Take 1 tablet (150 mg total) by mouth every morning. 90 tablet 3  . meloxicam (MOBIC) 15 MG tablet One tab PO qAM with a meal for 2 weeks, then daily prn pain. 30 tablet 3  . Multiple  Vitamins-Minerals (ZINC PO) Take 50 mg by mouth.    . TESTOSTERONE CYPIONATE IM Inject 100 mg into the muscle every 7 (seven) days.    Marland Kitchen VITAMIN D PO Take 50 mg by mouth.     No facility-administered medications prior to visit.    No Known Allergies  ROS Review of Systems    Objective:    Physical Exam  BP 111/69 (BP Location: Left Arm, Patient Position: Sitting, Cuff Size: Normal)   Pulse 75   Temp 98.8 F (37.1 C) (Oral)   Resp 20   Ht 5\' 6"  (1.676 m)   Wt 210 lb (95.3 kg)   SpO2 100%   BMI 33.89 kg/m  Wt Readings from Last 3 Encounters:  04/13/20 210 lb (95.3 kg)  02/23/20 210 lb (95.3 kg)  02/18/20 209 lb (94.8 kg)     Health Maintenance Due  Topic Date Due  . Hepatitis C Screening  Never done  . HIV Screening  Never done    There are no preventive care reminders to display for this patient.  No results found for: TSH Lab Results  Component Value Date   WBC 6.1 09/11/2019   HGB 15.0 09/11/2019   HCT 44.6 09/11/2019   MCV 88.3 09/11/2019   PLT 350 09/11/2019   Lab Results  Component Value Date   NA 142 09/11/2019   K 4.1 09/11/2019   CO2 33 (H) 09/11/2019   GLUCOSE 87 09/11/2019   BUN 9 09/11/2019   CREATININE 0.86 09/11/2019   BILITOT 0.8 09/11/2019   AST 15 09/11/2019   ALT 21 09/11/2019   PROT 6.7 09/11/2019   CALCIUM 9.2 09/11/2019   No results found for: CHOL No results found for: HDL No results found for: LDLCALC No results found for: TRIG No results found for: CHOLHDL No results found for: HGBA1C    Assessment & Plan:  Patient tolerated testosterone 100 mg injection to LUOQ well without complications. Patient advised to schedule next injection in 1 week.  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 testosterone cypionate (DEPOTESTOSTERONE CYPIONATE) injection 100 mg    Follow-up: Return in about 1 week (around 04/20/2020) for  Testosterone Injection.    [name], CMA

## 2020-04-20 ENCOUNTER — Ambulatory Visit (INDEPENDENT_AMBULATORY_CARE_PROVIDER_SITE_OTHER): Payer: BC Managed Care – PPO | Admitting: Osteopathic Medicine

## 2020-04-20 ENCOUNTER — Other Ambulatory Visit: Payer: Self-pay

## 2020-04-20 VITALS — BP 106/65 | HR 70

## 2020-04-20 DIAGNOSIS — IMO0001 Reserved for inherently not codable concepts without codable children: Secondary | ICD-10-CM

## 2020-04-20 DIAGNOSIS — Z79899 Other long term (current) drug therapy: Secondary | ICD-10-CM

## 2020-04-20 DIAGNOSIS — F640 Transsexualism: Secondary | ICD-10-CM

## 2020-04-20 MED ORDER — TESTOSTERONE CYPIONATE 200 MG/ML IM SOLN
100.0000 mg | Freq: Once | INTRAMUSCULAR | Status: AC
  Administered 2020-04-20: 100 mg via INTRAMUSCULAR

## 2020-04-20 NOTE — Progress Notes (Signed)
Testosterone given RUOQ without immediate complications.   

## 2020-04-27 ENCOUNTER — Ambulatory Visit (INDEPENDENT_AMBULATORY_CARE_PROVIDER_SITE_OTHER): Payer: BC Managed Care – PPO | Admitting: Osteopathic Medicine

## 2020-04-27 ENCOUNTER — Other Ambulatory Visit: Payer: Self-pay

## 2020-04-27 VITALS — BP 124/73 | HR 76 | Resp 20 | Ht 66.0 in | Wt 210.0 lb

## 2020-04-27 DIAGNOSIS — F640 Transsexualism: Secondary | ICD-10-CM

## 2020-04-27 DIAGNOSIS — Z789 Other specified health status: Secondary | ICD-10-CM

## 2020-04-27 DIAGNOSIS — Z79899 Other long term (current) drug therapy: Secondary | ICD-10-CM | POA: Diagnosis not present

## 2020-04-27 DIAGNOSIS — IMO0001 Reserved for inherently not codable concepts without codable children: Secondary | ICD-10-CM

## 2020-04-27 MED ORDER — TESTOSTERONE CYPIONATE 100 MG/ML IM SOLN
100.0000 mg | Freq: Once | INTRAMUSCULAR | Status: AC
  Administered 2020-04-27: 100 mg via INTRAMUSCULAR

## 2020-04-27 NOTE — Progress Notes (Signed)
Established Patient Office Visit  Subjective:  Patient ID: Victoria Hill, adult    DOB: Aug 21, 1985  Age: 35 y.o. MRN: [id number]  CC:  Chief Complaint  Patient presents with  . 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 Denies chest pain, shortness of breath, headaches, and problems with medication or mood changes.    Patient tolerated testosterone 100 mg injection to LUOQ well without complications. Patient advised to schedule next injection for 1 week.   Past Medical History:  Diagnosis Date  . Anxiety   . Depression     Past Surgical History:  Procedure Laterality Date  . CHEST SURGERY      No family history on file.  Social History   Socioeconomic History  . Marital status: Soil scientist    Spouse name: Not on file  . Number of children: Not on file  . Years of education: Not on file  . Highest education level: Not on file  Occupational History  . Not on file  Tobacco Use  . Smoking status: Never Smoker  . Smokeless tobacco: Never Used  Vaping Use  . Vaping Use: Never used  Substance and Sexual Activity  . Alcohol use: Not Currently  . Drug use: Never  . Sexual activity: Never    Partners: Female  Other Topics Concern  . Not on file  Social History Narrative  . Not on file   Social Determinants of Health   Financial Resource Strain: Not on file  Food Insecurity: Not on file  Transportation Needs: Not on file  Physical Activity: Not on file  Stress: Not on file  Social Connections: Not on file  Intimate Partner Violence: Not on file    Outpatient Medications Prior to Visit  Medication Sig Dispense Refill  . Ascorbic Acid (VITAMIN C PO) Take 100 mg by mouth.    Marland Kitchen buPROPion (WELLBUTRIN XL) 150 MG 24 hr tablet Take 1 tablet (150 mg total) by mouth every morning. 90 tablet 3  . meloxicam (MOBIC) 15 MG tablet One tab PO qAM with a meal for 2 weeks, then daily prn pain. 30 tablet 3   . Multiple Vitamins-Minerals (ZINC PO) Take 50 mg by mouth.    . TESTOSTERONE CYPIONATE IM Inject 100 mg into the muscle every 7 (seven) days.    Marland Kitchen VITAMIN D PO Take 50 mg by mouth.     No facility-administered medications prior to visit.    No Known Allergies  ROS Review of Systems    Objective:    Physical Exam  BP 124/73 (BP Location: Left Arm, Patient Position: Sitting, Cuff Size: Normal)   Pulse 76   Resp 20   Ht 5\' 6"  (1.676 m)   Wt 210 lb (95.3 kg)   SpO2 99%   BMI 33.89 kg/m  Wt Readings from Last 3 Encounters:  04/27/20 210 lb (95.3 kg)  04/13/20 210 lb (95.3 kg)  02/23/20 210 lb (95.3 kg)     Health Maintenance Due  Topic Date Due  . Hepatitis C Screening  Never done  . HIV Screening  Never done    There are no preventive care reminders to display for this patient.  No results found for: TSH Lab Results  Component Value Date   WBC 6.1 09/11/2019   HGB 15.0 09/11/2019   HCT 44.6 09/11/2019   MCV 88.3 09/11/2019   PLT 350 09/11/2019   Lab Results  Component Value  Date   NA 142 09/11/2019   K 4.1 09/11/2019   CO2 33 (H) 09/11/2019   GLUCOSE 87 09/11/2019   BUN 9 09/11/2019   CREATININE 0.86 09/11/2019   BILITOT 0.8 09/11/2019   AST 15 09/11/2019   ALT 21 09/11/2019   PROT 6.7 09/11/2019   CALCIUM 9.2 09/11/2019   No results found for: CHOL No results found for: HDL No results found for: LDLCALC No results found for: TRIG No results found for: CHOLHDL No results found for: HGBA1C    Assessment & Plan:  Patient tolerated testosterone 100 mg injection to LUOQ well without complications. Patient advised to schedule next injection for 1 week.    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 testosterone cypionate (DEPOTESTOTERONE CYPIONATE) injection 100 mg    Follow-up: Return in about 1 week (around 05/04/2020) for Testosterone injection.    [name], CMA

## 2020-05-04 ENCOUNTER — Ambulatory Visit (INDEPENDENT_AMBULATORY_CARE_PROVIDER_SITE_OTHER): Payer: BC Managed Care – PPO | Admitting: Osteopathic Medicine

## 2020-05-04 ENCOUNTER — Other Ambulatory Visit: Payer: Self-pay

## 2020-05-04 VITALS — BP 116/75 | HR 78 | Temp 98.8°F | Resp 20 | Ht 66.0 in | Wt 200.0 lb

## 2020-05-04 DIAGNOSIS — F640 Transsexualism: Secondary | ICD-10-CM | POA: Diagnosis not present

## 2020-05-04 DIAGNOSIS — Z79899 Other long term (current) drug therapy: Secondary | ICD-10-CM | POA: Diagnosis not present

## 2020-05-04 DIAGNOSIS — Z789 Other specified health status: Secondary | ICD-10-CM | POA: Diagnosis not present

## 2020-05-04 DIAGNOSIS — IMO0001 Reserved for inherently not codable concepts without codable children: Secondary | ICD-10-CM

## 2020-05-04 MED ORDER — TESTOSTERONE CYPIONATE 100 MG/ML IM SOLN
100.0000 mg | Freq: Once | INTRAMUSCULAR | Status: AC
  Administered 2020-05-04: 100 mg via INTRAMUSCULAR

## 2020-05-04 NOTE — Progress Notes (Signed)
Established Patient Office Visit  Subjective:  Patient ID: Victoria Hill, adult    DOB: August 26, 1985  Age: 35 y.o. MRN: [id number]  CC:  Chief Complaint  Patient presents with  . Female to female transsexual person on hormone therapy     HPI Victoria Hill presents for .for testosterone injection. Denies chest pain, shortness of breath, headaches, and problems with medication or mood changes.    Patient tolerated testosterone 100 mg injection to RUOQ well without complications. Patient advised to schedule next injection for one week.   Past Medical History:  Diagnosis Date  . Anxiety   . Depression     Past Surgical History:  Procedure Laterality Date  . CHEST SURGERY      No family history on file.  Social History   Socioeconomic History  . Marital status: Soil scientist    Spouse name: Not on file  . Number of children: Not on file  . Years of education: Not on file  . Highest education level: Not on file  Occupational History  . Not on file  Tobacco Use  . Smoking status: Never Smoker  . Smokeless tobacco: Never Used  Vaping Use  . Vaping Use: Never used  Substance and Sexual Activity  . Alcohol use: Not Currently  . Drug use: Never  . Sexual activity: Never    Partners: Female  Other Topics Concern  . Not on file  Social History Narrative  . Not on file   Social Determinants of Health   Financial Resource Strain: Not on file  Food Insecurity: Not on file  Transportation Needs: Not on file  Physical Activity: Not on file  Stress: Not on file  Social Connections: Not on file  Intimate Partner Violence: Not on file    Outpatient Medications Prior to Visit  Medication Sig Dispense Refill  . Ascorbic Acid (VITAMIN C PO) Take 100 mg by mouth.    Marland Kitchen buPROPion (WELLBUTRIN XL) 150 MG 24 hr tablet Take 1 tablet (150 mg total) by mouth every morning. 90 tablet 3  . meloxicam (MOBIC) 15 MG tablet One tab PO qAM with a meal for 2 weeks, then daily prn pain. 30  tablet 3  . Multiple Vitamins-Minerals (ZINC PO) Take 50 mg by mouth.    . TESTOSTERONE CYPIONATE IM Inject 100 mg into the muscle every 7 (seven) days.    Marland Kitchen VITAMIN D PO Take 50 mg by mouth.     No facility-administered medications prior to visit.    No Known Allergies  ROS Review of Systems    Objective:    Physical Exam  BP 116/75 (BP Location: Left Arm, Patient Position: Sitting, Cuff Size: Normal)   Pulse 78   Temp 98.8 F (37.1 C) (Temporal)   Resp 20   Ht 5\' 6"  (1.676 m)   Wt 200 lb (90.7 kg)   SpO2 99%   BMI 32.28 kg/m  Wt Readings from Last 3 Encounters:  05/04/20 200 lb (90.7 kg)  04/27/20 210 lb (95.3 kg)  04/13/20 210 lb (95.3 kg)     Health Maintenance Due  Topic Date Due  . Hepatitis C Screening  Never done  . HIV Screening  Never done    There are no preventive care reminders to display for this patient.  No results found for: TSH Lab Results  Component Value Date   WBC 6.1 09/11/2019   HGB 15.0 09/11/2019   HCT 44.6 09/11/2019   MCV 88.3 09/11/2019   PLT  350 09/11/2019   Lab Results  Component Value Date   NA 142 09/11/2019   K 4.1 09/11/2019   CO2 33 (H) 09/11/2019   GLUCOSE 87 09/11/2019   BUN 9 09/11/2019   CREATININE 0.86 09/11/2019   BILITOT 0.8 09/11/2019   AST 15 09/11/2019   ALT 21 09/11/2019   PROT 6.7 09/11/2019   CALCIUM 9.2 09/11/2019   No results found for: CHOL No results found for: HDL No results found for: LDLCALC No results found for: TRIG No results found for: CHOLHDL No results found for: HGBA1C    Assessment & Plan:  Patient tolerated testosterone 100 mg injection to RUOQ well without complications. Patient advised to schedule next injection for one week.  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 testosterone cypionate (DEPOTESTOTERONE CYPIONATE) injection 100 mg    Follow-up: Return in about 1 week (around 05/11/2020) for Testosterone injection.    [name], CMA

## 2020-05-11 ENCOUNTER — Ambulatory Visit (INDEPENDENT_AMBULATORY_CARE_PROVIDER_SITE_OTHER): Payer: BC Managed Care – PPO | Admitting: Osteopathic Medicine

## 2020-05-11 ENCOUNTER — Other Ambulatory Visit: Payer: Self-pay

## 2020-05-11 VITALS — BP 134/75 | HR 70 | Temp 98.1°F | Resp 20 | Ht 66.0 in | Wt 198.0 lb

## 2020-05-11 DIAGNOSIS — Z79899 Other long term (current) drug therapy: Secondary | ICD-10-CM

## 2020-05-11 DIAGNOSIS — Z789 Other specified health status: Secondary | ICD-10-CM | POA: Diagnosis not present

## 2020-05-11 DIAGNOSIS — IMO0001 Reserved for inherently not codable concepts without codable children: Secondary | ICD-10-CM

## 2020-05-11 DIAGNOSIS — F640 Transsexualism: Secondary | ICD-10-CM | POA: Diagnosis not present

## 2020-05-11 MED ORDER — TESTOSTERONE CYPIONATE 100 MG/ML IM SOLN
100.0000 mg | Freq: Once | INTRAMUSCULAR | Status: AC
  Administered 2020-05-11: 100 mg via INTRAMUSCULAR

## 2020-05-11 NOTE — Progress Notes (Signed)
Established Patient Office Visit  Subjective:  Patient ID: Victoria Hill, adult    DOB: 09-27-1985  Age: 35 y.o. MRN: [id number]  CC:  Chief Complaint  Patient presents with  . 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 Denies chest pain, shortness of breath, headaches, and problems with medication or mood changes.    Patient tolerated testosterone 100 mg injection to LUOQ well without complications. Patient advised to schedule next injection in one week.   Past Medical History:  Diagnosis Date  . Anxiety   . Depression     Past Surgical History:  Procedure Laterality Date  . CHEST SURGERY      History reviewed. No pertinent family history.  Social History   Socioeconomic History  . Marital status: Soil scientist    Spouse name: Not on file  . Number of children: Not on file  . Years of education: Not on file  . Highest education level: Not on file  Occupational History  . Not on file  Tobacco Use  . Smoking status: Never Smoker  . Smokeless tobacco: Never Used  Vaping Use  . Vaping Use: Never used  Substance and Sexual Activity  . Alcohol use: Not Currently  . Drug use: Never  . Sexual activity: Never    Partners: Female  Other Topics Concern  . Not on file  Social History Narrative  . Not on file   Social Determinants of Health   Financial Resource Strain: Not on file  Food Insecurity: Not on file  Transportation Needs: Not on file  Physical Activity: Not on file  Stress: Not on file  Social Connections: Not on file  Intimate Partner Violence: Not on file    Outpatient Medications Prior to Visit  Medication Sig Dispense Refill  . Ascorbic Acid (VITAMIN C PO) Take 100 mg by mouth.    Marland Kitchen buPROPion (WELLBUTRIN XL) 150 MG 24 hr tablet Take 1 tablet (150 mg total) by mouth every morning. 90 tablet 3  . meloxicam (MOBIC) 15 MG tablet One tab PO qAM with a meal for 2 weeks, then daily prn  pain. 30 tablet 3  . Multiple Vitamins-Minerals (ZINC PO) Take 50 mg by mouth.    . TESTOSTERONE CYPIONATE IM Inject 100 mg into the muscle every 7 (seven) days.    Marland Kitchen VITAMIN D PO Take 50 mg by mouth.     No facility-administered medications prior to visit.    No Known Allergies  ROS Review of Systems    Objective:    Physical Exam  BP 134/75 (BP Location: Left Arm, Patient Position: Sitting, Cuff Size: Normal)   Pulse 70   Temp 98.1 F (36.7 C) (Oral)   Resp 20   Ht 5\' 6"  (1.676 m)   Wt 198 lb (89.8 kg)   SpO2 100%   BMI 31.96 kg/m  Wt Readings from Last 3 Encounters:  05/11/20 198 lb (89.8 kg)  05/04/20 200 lb (90.7 kg)  04/27/20 210 lb (95.3 kg)     Health Maintenance Due  Topic Date Due  . Hepatitis C Screening  Never done  . HIV Screening  Never done    There are no preventive care reminders to display for this patient.  No results found for: TSH Lab Results  Component Value Date   WBC 6.1 09/11/2019   HGB 15.0 09/11/2019   HCT 44.6 09/11/2019   MCV 88.3 09/11/2019   PLT  350 09/11/2019   Lab Results  Component Value Date   NA 142 09/11/2019   K 4.1 09/11/2019   CO2 33 (H) 09/11/2019   GLUCOSE 87 09/11/2019   BUN 9 09/11/2019   CREATININE 0.86 09/11/2019   BILITOT 0.8 09/11/2019   AST 15 09/11/2019   ALT 21 09/11/2019   PROT 6.7 09/11/2019   CALCIUM 9.2 09/11/2019   No results found for: CHOL No results found for: HDL No results found for: LDLCALC No results found for: TRIG No results found for: CHOLHDL No results found for: HGBA1C    Assessment & Plan:  Patient tolerated testosterone 100 mg injection to LUOQ well without complications. Patient advised to schedule next injection in one week.  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 testosterone cypionate (DEPOTESTOTERONE CYPIONATE) injection 100 mg    Follow-up: Return in about 1 week (around 05/18/2020) for Testosterone Injection.    [name], CMA

## 2020-05-17 ENCOUNTER — Ambulatory Visit: Payer: BC Managed Care – PPO | Admitting: Sports Medicine

## 2020-05-17 ENCOUNTER — Other Ambulatory Visit: Payer: Self-pay

## 2020-05-17 DIAGNOSIS — S93331A Other subluxation of right foot, initial encounter: Secondary | ICD-10-CM

## 2020-05-17 DIAGNOSIS — F640 Transsexualism: Secondary | ICD-10-CM

## 2020-05-17 DIAGNOSIS — IMO0001 Reserved for inherently not codable concepts without codable children: Secondary | ICD-10-CM

## 2020-05-17 DIAGNOSIS — Z79899 Other long term (current) drug therapy: Secondary | ICD-10-CM | POA: Diagnosis not present

## 2020-05-17 MED ORDER — TESTOSTERONE CYPIONATE 200 MG/ML IM SOLN
100.0000 mg | Freq: Once | INTRAMUSCULAR | Status: AC
  Administered 2020-05-17: 100 mg via INTRAMUSCULAR

## 2020-05-17 NOTE — Progress Notes (Signed)
    Procedures performed today:    None.  Independent interpretation of notes and tests performed by another provider:   None.  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 I was able to reproduce some minor discomfort today but only with extreme eversion with significant resistance. Continue rehabilitation exercises, return to the boot if increasing discomfort or mechanical symptoms but return to see me as needed.  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 Condition is stable, prescription drug management today.    ___________________________________________ Gwen Her. Dianah Field, M.D., ABFM., CAQSM. Primary Care and [place] Instructor of [employer] [facility] [employer]

## 2020-05-17 NOTE — Assessment & Plan Note (Signed)
>>  ASSESSMENT AND PLAN FOR TRANSGENDER WRITTEN ON 05/17/2020  9:12 AM BY Monica Becton, MD  Female to female transgender, due for testosterone 100 mg intramuscular today. Condition is stable, prescription drug management today.

## 2020-05-17 NOTE — Assessment & Plan Note (Addendum)
Female to female transgender, due for testosterone 100 mg intramuscular today. Condition is stable, prescription drug management today.

## 2020-05-17 NOTE — Assessment & Plan Note (Signed)
This is a very pleasant 35 year old female, peroneal subluxation, much better with the boot, home rehab exercises and meloxicam. I was able to reproduce some minor discomfort today but only with extreme eversion with significant resistance. Continue rehabilitation exercises, return to the boot if increasing discomfort or mechanical symptoms but return to see me as needed.

## 2020-05-18 ENCOUNTER — Ambulatory Visit: Payer: BC Managed Care – PPO

## 2020-05-25 ENCOUNTER — Other Ambulatory Visit: Payer: Self-pay

## 2020-05-25 ENCOUNTER — Ambulatory Visit (INDEPENDENT_AMBULATORY_CARE_PROVIDER_SITE_OTHER): Payer: BC Managed Care – PPO | Admitting: Medical-Surgical

## 2020-05-25 VITALS — BP 117/67 | HR 90 | Temp 98.8°F | Resp 20 | Ht 66.0 in | Wt 198.0 lb

## 2020-05-25 DIAGNOSIS — IMO0001 Reserved for inherently not codable concepts without codable children: Secondary | ICD-10-CM

## 2020-05-25 DIAGNOSIS — Z79899 Other long term (current) drug therapy: Secondary | ICD-10-CM

## 2020-05-25 DIAGNOSIS — F640 Transsexualism: Secondary | ICD-10-CM

## 2020-05-25 DIAGNOSIS — Z789 Other specified health status: Secondary | ICD-10-CM

## 2020-05-25 MED ORDER — TESTOSTERONE CYPIONATE 100 MG/ML IM SOLN
100.0000 mg | Freq: Once | INTRAMUSCULAR | Status: AC
  Administered 2020-05-25: 100 mg via INTRAMUSCULAR

## 2020-05-25 NOTE — Progress Notes (Signed)
Established Patient Office Visit  Subjective:  Patient ID: Victoria Hill, adult    DOB: 1985-05-15  Age: 35 y.o. MRN: [id number]  CC:  Chief Complaint  Patient presents with  . 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 Denies chest pain, shortness of breath, headaches, and problems with medication or mood changes.    Patient tolerated testosterone 100 mg injection to LUOQ well without complications. Patient will return in one week for next testosterone injection.   Past Medical History:  Diagnosis Date  . Anxiety   . Depression     Past Surgical History:  Procedure Laterality Date  . CHEST SURGERY      History reviewed. No pertinent family history.  Social History   Socioeconomic History  . Marital status: Soil scientist    Spouse name: Not on file  . Number of children: Not on file  . Years of education: Not on file  . Highest education level: Not on file  Occupational History  . Not on file  Tobacco Use  . Smoking status: Never Smoker  . Smokeless tobacco: Never Used  Vaping Use  . Vaping Use: Never used  Substance and Sexual Activity  . Alcohol use: Not Currently  . Drug use: Never  . Sexual activity: Never    Partners: Female  Other Topics Concern  . Not on file  Social History Narrative  . Not on file   Social Determinants of Health   Financial Resource Strain: Not on file  Food Insecurity: Not on file  Transportation Needs: Not on file  Physical Activity: Not on file  Stress: Not on file  Social Connections: Not on file  Intimate Partner Violence: Not on file    Outpatient Medications Prior to Visit  Medication Sig Dispense Refill  . Ascorbic Acid (VITAMIN C PO) Take 100 mg by mouth.    Marland Kitchen buPROPion (WELLBUTRIN XL) 150 MG 24 hr tablet Take 1 tablet (150 mg total) by mouth every morning. 90 tablet 3  . meloxicam (MOBIC) 15 MG tablet One tab PO qAM with a meal for 2 weeks,  then daily prn pain. 30 tablet 3  . Multiple Vitamins-Minerals (ZINC PO) Take 50 mg by mouth.    . TESTOSTERONE CYPIONATE IM Inject 100 mg into the muscle every 7 (seven) days.    Marland Kitchen VITAMIN D PO Take 50 mg by mouth.     No facility-administered medications prior to visit.    No Known Allergies  ROS Review of Systems    Objective:    Physical Exam  BP 117/67 (BP Location: Left Arm, Patient Position: Sitting, Cuff Size: Large)   Pulse 90   Temp 98.8 F (37.1 C) (Oral)   Resp 20   Ht 5\' 6"  (1.676 m)   Wt 198 lb (89.8 kg)   SpO2 99%   BMI 31.96 kg/m  Wt Readings from Last 3 Encounters:  05/25/20 198 lb (89.8 kg)  05/11/20 198 lb (89.8 kg)  05/04/20 200 lb (90.7 kg)     Health Maintenance Due  Topic Date Due  . Hepatitis C Screening  Never done  . HIV Screening  Never done    There are no preventive care reminders to display for this patient.  No results found for: TSH Lab Results  Component Value Date   WBC 6.1 09/11/2019   HGB 15.0 09/11/2019   HCT 44.6 09/11/2019   MCV 88.3 09/11/2019  PLT 350 09/11/2019   Lab Results  Component Value Date   NA 142 09/11/2019   K 4.1 09/11/2019   CO2 33 (H) 09/11/2019   GLUCOSE 87 09/11/2019   BUN 9 09/11/2019   CREATININE 0.86 09/11/2019   BILITOT 0.8 09/11/2019   AST 15 09/11/2019   ALT 21 09/11/2019   PROT 6.7 09/11/2019   CALCIUM 9.2 09/11/2019   No results found for: CHOL No results found for: HDL No results found for: LDLCALC No results found for: TRIG No results found for: CHOLHDL No results found for: HGBA1C    Assessment & Plan:  Patient tolerated testosterone 100 mg injection to LUOQ well without complications. Patient will return in one week for next testosterone injection.  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 testosterone cypionate (DEPOTESTOTERONE CYPIONATE) injection 100 mg    Follow-up: Return in about 1 week (around 06/01/2020) for Testosterone Injection.    [name], CMA

## 2020-05-25 NOTE — Progress Notes (Signed)
Testosterone injection completed as ordered. Return for next injection as scheduled in 1 week.   Clearnce Sorrel, DNP, APRN, FNP-BC [facility]

## 2020-06-01 ENCOUNTER — Ambulatory Visit (INDEPENDENT_AMBULATORY_CARE_PROVIDER_SITE_OTHER): Payer: BC Managed Care – PPO | Admitting: Osteopathic Medicine

## 2020-06-01 ENCOUNTER — Other Ambulatory Visit: Payer: Self-pay

## 2020-06-01 VITALS — BP 113/61 | HR 79

## 2020-06-01 DIAGNOSIS — IMO0001 Reserved for inherently not codable concepts without codable children: Secondary | ICD-10-CM

## 2020-06-01 DIAGNOSIS — Z79899 Other long term (current) drug therapy: Secondary | ICD-10-CM | POA: Diagnosis not present

## 2020-06-01 DIAGNOSIS — F640 Transsexualism: Secondary | ICD-10-CM | POA: Diagnosis not present

## 2020-06-01 MED ORDER — TESTOSTERONE CYPIONATE 200 MG/ML IM SOLN
100.0000 mg | Freq: Once | INTRAMUSCULAR | Status: AC
  Administered 2020-06-01: 100 mg via INTRAMUSCULAR

## 2020-06-01 NOTE — Progress Notes (Signed)
Established Patient Office Visit  Subjective:  Patient ID: Victoria Hill, adult    DOB: 1985-06-29  Age: 35 y.o. MRN: [id number]  CC:  Chief Complaint  Patient presents with  . Injections    HPI Victoria Hill is here for a testosterone injection. Denies chest pain, shortness of breath, headaches or mood changes.   Past Medical History:  Diagnosis Date  . Anxiety   . Depression     Past Surgical History:  Procedure Laterality Date  . CHEST SURGERY      History reviewed. No pertinent family history.  Social History   Socioeconomic History  . Marital status: Soil scientist    Spouse name: Not on file  . Number of children: Not on file  . Years of education: Not on file  . Highest education level: Not on file  Occupational History  . Not on file  Tobacco Use  . Smoking status: Never Smoker  . Smokeless tobacco: Never Used  Vaping Use  . Vaping Use: Never used  Substance and Sexual Activity  . Alcohol use: Not Currently  . Drug use: Never  . Sexual activity: Never    Partners: Female  Other Topics Concern  . Not on file  Social History Narrative  . Not on file   Social Determinants of Health   Financial Resource Strain: Not on file  Food Insecurity: Not on file  Transportation Needs: Not on file  Physical Activity: Not on file  Stress: Not on file  Social Connections: Not on file  Intimate Partner Violence: Not on file    Outpatient Medications Prior to Visit  Medication Sig Dispense Refill  . Ascorbic Acid (VITAMIN C PO) Take 100 mg by mouth.    Marland Kitchen buPROPion (WELLBUTRIN XL) 150 MG 24 hr tablet Take 1 tablet (150 mg total) by mouth every morning. 90 tablet 3  . meloxicam (MOBIC) 15 MG tablet One tab PO qAM with a meal for 2 weeks, then daily prn pain. 30 tablet 3  . Multiple Vitamins-Minerals (ZINC PO) Take 50 mg by mouth.    . TESTOSTERONE CYPIONATE IM Inject 100 mg into the muscle every 7 (seven) days.    Marland Kitchen VITAMIN D PO Take 50 mg by mouth.     No  facility-administered medications prior to visit.    No Known Allergies  ROS Review of Systems    Objective:    Physical Exam  BP 113/61   Pulse 79   SpO2 100%  Wt Readings from Last 3 Encounters:  05/25/20 198 lb (89.8 kg)  05/11/20 198 lb (89.8 kg)  05/04/20 200 lb (90.7 kg)     Health Maintenance Due  Topic Date Due  . Hepatitis C Screening  Never done  . HIV Screening  Never done    There are no preventive care reminders to display for this patient.  No results found for: TSH Lab Results  Component Value Date   WBC 6.1 09/11/2019   HGB 15.0 09/11/2019   HCT 44.6 09/11/2019   MCV 88.3 09/11/2019   PLT 350 09/11/2019   Lab Results  Component Value Date   NA 142 09/11/2019   K 4.1 09/11/2019   CO2 33 (H) 09/11/2019   GLUCOSE 87 09/11/2019   BUN 9 09/11/2019   CREATININE 0.86 09/11/2019   BILITOT 0.8 09/11/2019   AST 15 09/11/2019   ALT 21 09/11/2019   PROT 6.7 09/11/2019   CALCIUM 9.2 09/11/2019   No results found for: CHOL No  results found for: HDL No results found for: LDLCALC No results found for: TRIG No results found for: CHOLHDL No results found for: HGBA1C    Assessment & Plan:  Testosterone injection - Patient tolerated injection well without complications. Patient advised to schedule next injection 7 days from today.   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 testosterone cypionate (DEPOTESTOSTERONE CYPIONATE) injection 100 mg    Follow-up: Return in about 1 week (around 06/08/2020) for testosterone injection. [name], [name], [place]

## 2020-06-02 ENCOUNTER — Ambulatory Visit (INDEPENDENT_AMBULATORY_CARE_PROVIDER_SITE_OTHER): Payer: BC Managed Care – PPO | Admitting: Sports Medicine

## 2020-06-02 DIAGNOSIS — M5431 Sciatica, right side: Secondary | ICD-10-CM | POA: Insufficient documentation

## 2020-06-02 NOTE — Assessment & Plan Note (Signed)
This is a pleasant 35 year old female, for the past week or so he has had numbness and tingling running down his right leg, thigh, knee, foot. On further questioning he keeps his wallet in the right buttock pocket. Does not really have any back pain. This is likely sciatica of some sort, potentially piriformis syndrome or radiculitis. We will start with keeping the wallet in the front, as well as some herniated disc and piriformis syndrome stretches. I like to do this for a solid month before we consider pursuing a radicular source of the paresthesias.

## 2020-06-02 NOTE — Progress Notes (Signed)
    Procedures performed today:    None.  Independent interpretation of notes and tests performed by another provider:   None.  Brief History, Exam, Impression, and Recommendations:    Sciatica, right side This is a pleasant 35 year old female, for the past week or so he has had numbness and tingling running down his right leg, thigh, knee, foot. On further questioning he keeps his wallet in the right buttock pocket. Does not really have any back pain. This is likely sciatica of some sort, potentially piriformis syndrome or radiculitis. We will start with keeping the wallet in the front, as well as some herniated disc and piriformis syndrome stretches. I like to do this for a solid month before we consider pursuing a radicular source of the paresthesias.     ___________________________________________ Gwen Her. Dianah Field, M.D., ABFM., CAQSM. Primary Care and [place] Instructor of [place] [facility] [employer]

## 2020-06-02 NOTE — Patient Instructions (Signed)

## 2020-06-08 ENCOUNTER — Other Ambulatory Visit: Payer: Self-pay

## 2020-06-08 ENCOUNTER — Ambulatory Visit (INDEPENDENT_AMBULATORY_CARE_PROVIDER_SITE_OTHER): Payer: BC Managed Care – PPO | Admitting: Osteopathic Medicine

## 2020-06-08 VITALS — BP 118/71 | HR 81 | Temp 98.3°F | Resp 20 | Ht 66.0 in | Wt 198.0 lb

## 2020-06-08 DIAGNOSIS — F640 Transsexualism: Secondary | ICD-10-CM

## 2020-06-08 DIAGNOSIS — Z79899 Other long term (current) drug therapy: Secondary | ICD-10-CM

## 2020-06-08 DIAGNOSIS — IMO0001 Reserved for inherently not codable concepts without codable children: Secondary | ICD-10-CM

## 2020-06-08 MED ORDER — TESTOSTERONE CYPIONATE 100 MG/ML IM SOLN
100.0000 mg | Freq: Once | INTRAMUSCULAR | Status: AC
  Administered 2020-06-08: 100 mg via INTRAMUSCULAR

## 2020-06-08 NOTE — Progress Notes (Signed)
Established Patient Office Visit  Subjective:  Patient ID: Victoria Hill, adult    DOB: Jun 03, 1985  Age: 35 y.o. MRN: [id number]  CC:  Chief Complaint  Patient presents with  . 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 Denies chest pain, shortness of breath, headaches, and problems with medication or mood changes.    Patient tolerated testosterone 100 mg injection to LUOQ well without complications. Patient advised to schedule next injection in 1 week.   Past Medical History:  Diagnosis Date  . Anxiety   . Depression     Past Surgical History:  Procedure Laterality Date  . CHEST SURGERY      History reviewed. No pertinent family history.  Social History   Socioeconomic History  . Marital status: Soil scientist    Spouse name: Not on file  . Number of children: Not on file  . Years of education: Not on file  . Highest education level: Not on file  Occupational History  . Not on file  Tobacco Use  . Smoking status: Never Smoker  . Smokeless tobacco: Never Used  Vaping Use  . Vaping Use: Never used  Substance and Sexual Activity  . Alcohol use: Not Currently  . Drug use: Never  . Sexual activity: Never    Partners: Female  Other Topics Concern  . Not on file  Social History Narrative  . Not on file   Social Determinants of Health   Financial Resource Strain: Not on file  Food Insecurity: Not on file  Transportation Needs: Not on file  Physical Activity: Not on file  Stress: Not on file  Social Connections: Not on file  Intimate Partner Violence: Not on file    Outpatient Medications Prior to Visit  Medication Sig Dispense Refill  . Ascorbic Acid (VITAMIN C PO) Take 100 mg by mouth.    Marland Kitchen buPROPion (WELLBUTRIN XL) 150 MG 24 hr tablet Take 1 tablet (150 mg total) by mouth every morning. 90 tablet 3  . meloxicam (MOBIC) 15 MG tablet One tab PO qAM with a meal for 2 weeks, then daily prn  pain. 30 tablet 3  . Multiple Vitamins-Minerals (ZINC PO) Take 50 mg by mouth.    . TESTOSTERONE CYPIONATE IM Inject 100 mg into the muscle every 7 (seven) days.    Marland Kitchen VITAMIN D PO Take 50 mg by mouth.     No facility-administered medications prior to visit.    No Known Allergies  ROS Review of Systems    Objective:    Physical Exam  BP 118/71 (BP Location: Left Arm, Patient Position: Sitting, Cuff Size: Large)   Pulse 81   Temp 98.3 F (36.8 C) (Oral)   Resp 20   Ht 5\' 6"  (1.676 m)   Wt 198 lb (89.8 kg)   SpO2 100%   BMI 31.96 kg/m  Wt Readings from Last 3 Encounters:  06/08/20 198 lb (89.8 kg)  05/25/20 198 lb (89.8 kg)  05/11/20 198 lb (89.8 kg)     Health Maintenance Due  Topic Date Due  . HIV Screening  Never done  . Hepatitis C Screening  Never done    There are no preventive care reminders to display for this patient.  No results found for: TSH Lab Results  Component Value Date   WBC 6.1 09/11/2019   HGB 15.0 09/11/2019   HCT 44.6 09/11/2019   MCV 88.3 09/11/2019   PLT 350  09/11/2019   Lab Results  Component Value Date   NA 142 09/11/2019   K 4.1 09/11/2019   CO2 33 (H) 09/11/2019   GLUCOSE 87 09/11/2019   BUN 9 09/11/2019   CREATININE 0.86 09/11/2019   BILITOT 0.8 09/11/2019   AST 15 09/11/2019   ALT 21 09/11/2019   PROT 6.7 09/11/2019   CALCIUM 9.2 09/11/2019   No results found for: CHOL No results found for: HDL No results found for: LDLCALC No results found for: TRIG No results found for: CHOLHDL No results found for: HGBA1C    Assessment & Plan:  Patient tolerated testosterone 100 mg injection to LUOQ well without complications. Patient advised to schedule next injection in 1 week.  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 testosterone cypionate (DEPOTESTOTERONE CYPIONATE) injection 100 mg    Follow-up: Return in about 1 week  (around 06/15/2020) for Testosterone Injection.    Victoria Hill, CMA

## 2020-06-15 ENCOUNTER — Other Ambulatory Visit: Payer: Self-pay

## 2020-06-15 ENCOUNTER — Ambulatory Visit (INDEPENDENT_AMBULATORY_CARE_PROVIDER_SITE_OTHER): Payer: BC Managed Care – PPO | Admitting: Osteopathic Medicine

## 2020-06-15 ENCOUNTER — Encounter: Payer: Self-pay | Admitting: Osteopathic Medicine

## 2020-06-15 ENCOUNTER — Ambulatory Visit: Payer: BC Managed Care – PPO

## 2020-06-15 VITALS — BP 126/71 | HR 77 | Temp 98.5°F | Wt 198.1 lb

## 2020-06-15 DIAGNOSIS — F32A Depression, unspecified: Secondary | ICD-10-CM

## 2020-06-15 DIAGNOSIS — R102 Pelvic and perineal pain: Secondary | ICD-10-CM

## 2020-06-15 DIAGNOSIS — Z789 Other specified health status: Secondary | ICD-10-CM

## 2020-06-15 DIAGNOSIS — Z0000 Encounter for general adult medical examination without abnormal findings: Secondary | ICD-10-CM | POA: Diagnosis not present

## 2020-06-15 DIAGNOSIS — F419 Anxiety disorder, unspecified: Secondary | ICD-10-CM | POA: Diagnosis not present

## 2020-06-15 MED ORDER — TESTOSTERONE CYPIONATE 200 MG/ML IM SOLN
100.0000 mg | Freq: Once | INTRAMUSCULAR | Status: AC
  Administered 2020-06-15: 100 mg via INTRAMUSCULAR

## 2020-06-15 NOTE — Progress Notes (Signed)
Pt tolerated testosterone (100 mg) injection well on RUOQ without any complications. Pt informed to schedule next injection in 7 days.

## 2020-06-15 NOTE — Progress Notes (Signed)
Victoria Hill is a 35 y.o. adult who presents to  [place] at [facility]  today, 06/15/20, seeking care for the following:  . Annual physical . Testosterone shot  . [place] with other pertinent findings:  The primary encounter diagnosis was Annual physical exam. Diagnoses of Transgender man: AFAB, s/p top surgery, on T  and Anxiety and depression were also pertinent to this visit.    Patient Instructions  General Preventive Care  Most recent routine screening labs: ordered.   Blood pressure goal 130/80 or less.   Tobacco: don't!  Alcohol: responsible moderation is ok for most adults - if you have concerns about your alcohol intake, please talk to me!   Exercise: as tolerated to reduce risk of cardiovascular disease and diabetes. Strength training will also prevent osteoporosis.   Mental health: if need for mental health care (medicines, counseling, other), or concerns about moods, please let me know!   Sexual / Reproductive health: if need for STD testing, or if concerns with libido/pain problems, please let me know! If you need to discuss family planning, please let me know!   Advanced Directive: Living Will and/or Healthcare Power of Attorney recommended for all adults, regardless of age or health.  Vaccines  Flu vaccine: for almost everyone, every fall.   Shingles vaccine: after age 68.   Pneumonia vaccines: after age 1.  Tetanus booster: every 10 years, due 2031  HPV vaccine: Gardasil up to age 86 to prevent HPV-associated diseases, including certain cancers.   COVID vaccine: THANKS for getting your vaccine! :) Cancer screenings   Colon cancer screening: for everyone age 41-75.    Breast cancer screening: mammogram not needed post top surgery   Cervical cancer screening: Pap recommended every 5 years if normal.   Lung cancer screening: not needed for non-smokers  Infection screenings   . HIV: recommended screening at least once age 84-65 . Gonorrhea/Chlamydia, other STI: screening as needed . Hepatitis C: recommended once for everyone age 18-75 . TB: certain at-risk populations, or depending on work requirements and/or travel history Other . Bone Density Test: recommended for at age 68 . Abdominal Aortic Aneurysm: not needed for non-smokers    Orders Placed This Encounter  Procedures  . CBC  . COMPLETE METABOLIC PANEL WITH GFR  . Lipid panel    Meds ordered this encounter  Medications  . testosterone cypionate (DEPOTESTOSTERONE CYPIONATE) injection 100 mg     See below for relevant physical exam findings  See below for recent lab and imaging results reviewed  Medications, allergies, PMH, PSH, SocH, FamH reviewed below    Follow-up instructions: Return for WEEKLY TESTOSTERONE INJECTIONS W/ NURSE VISIT. LABS AROUND 11/2020. ANNUAL W/ DR A IN 1 YEAR! Marland Kitchen                                        Exam:  BP 126/71 (BP Location: Left Arm, Patient Position: Sitting, Cuff Size: Normal)   Pulse 77   Temp 98.5 F (36.9 C) (Oral)   Wt 198 lb 1.3 oz (89.8 kg)   BMI 31.97 kg/m   Constitutional: VS see above. General Appearance: alert, well-developed, well-nourished, NAD  Neck: No masses, trachea midline.   Respiratory: Normal respiratory effort. no wheeze, no rhonchi, no rales  Cardiovascular: S1/S2 normal, no murmur, no rub/gallop auscultated. RRR.  Musculoskeletal: Gait normal. Symmetric and independent movement of all extremities  Abdominal: non-tender, non-distended, no appreciable organomegaly, neg Murphy's, BS WNLx4  Neurological: Normal balance/coordination. No tremor.  Skin: warm, dry, intact.   Psychiatric: Normal judgment/insight. Normal mood and affect. Oriented x3.   Current Meds  Medication Sig  . Ascorbic Acid (VITAMIN C PO) Take 100 mg by mouth.  Marland Kitchen buPROPion (WELLBUTRIN XL) 150 MG 24 hr tablet Take 1  tablet (150 mg total) by mouth every morning.  . meloxicam (MOBIC) 15 MG tablet One tab PO qAM with a meal for 2 weeks, then daily prn pain.  . Multiple Vitamins-Minerals (ZINC PO) Take 50 mg by mouth.  . TESTOSTERONE CYPIONATE IM Inject 100 mg into the muscle every 7 (seven) days.  Marland Kitchen VITAMIN D PO Take 50 mg by mouth.    No Known Allergies  Patient Active Problem List   Diagnosis Date Noted  . Sciatica, right side 06/02/2020  . 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 Right ankle pain 12/15/2019  . 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  Social History   Tobacco Use  Smoking Status Never Smoker  Smokeless Tobacco Never Used    Past Surgical History:  Procedure Laterality Date  . CHEST SURGERY      Immunization History  Administered Date(s) Administered  . Janssen (J&J) SARS-COV-2 Vaccination 05/01/2019  . PFIZER(Purple Top)SARS-COV-2 Vaccination 11/27/2019  . Tdap 06/11/2019    No results found for this or any previous visit (from the past 2160 hour(s)).  No results found.     All questions at time of visit were answered - patient instructed to contact office with any additional concerns or updates. ER/RTC precautions were reviewed with the patient as applicable.   Please note: manual typing as well as voice recognition software may have been used to produce this document - typos may escape review. Please contact Dr. Sheppard Coil for any needed clarifications.

## 2020-06-15 NOTE — Patient Instructions (Signed)
General Preventive Care  Most recent routine screening labs: ordered.   Blood pressure goal 130/80 or less.   Tobacco: don't!  Alcohol: responsible moderation is ok for most adults - if you have concerns about your alcohol intake, please talk to me!   Exercise: as tolerated to reduce risk of cardiovascular disease and diabetes. Strength training will also prevent osteoporosis.   Mental health: if need for mental health care (medicines, counseling, other), or concerns about moods, please let me know!   Sexual / Reproductive health: if need for STD testing, or if concerns with libido/pain problems, please let me know! If you need to discuss family planning, please let me know!   Advanced Directive: Living Will and/or Healthcare Power of Attorney recommended for all adults, regardless of age or health.  Vaccines  Flu vaccine: for almost everyone, every fall.   Shingles vaccine: after age 5.   Pneumonia vaccines: after age 58.  Tetanus booster: every 10 years, due 2031  HPV vaccine: Gardasil up to age 69 to prevent HPV-associated diseases, including certain cancers.   COVID vaccine: THANKS for getting your vaccine! :) Cancer screenings   Colon cancer screening: for everyone age 80-75.    Breast cancer screening: mammogram not needed post top surgery   Cervical cancer screening: Pap recommended every 5 years if normal.   Lung cancer screening: not needed for non-smokers  Infection screenings  . HIV: recommended screening at least once age 43-65 . Gonorrhea/Chlamydia, other STI: screening as needed . Hepatitis C: recommended once for everyone age 55-75 . TB: certain at-risk populations, or depending on work requirements and/or travel history Other . Bone Density Test: recommended for at age 40 . Abdominal Aortic Aneurysm: not needed for non-smokers

## 2020-06-16 LAB — CBC
HCT: 46.9 % — ABNORMAL HIGH (ref 35.0–45.0)
Hemoglobin: 15.7 g/dL — ABNORMAL HIGH (ref 11.7–15.5)
MCH: 30 pg (ref 27.0–33.0)
MCHC: 33.5 g/dL (ref 32.0–36.0)
MCV: 89.5 fL (ref 80.0–100.0)
MPV: 10.6 fL (ref 7.5–12.5)
Platelets: 343 10*3/uL (ref 140–400)
RBC: 5.24 10*6/uL — ABNORMAL HIGH (ref 3.80–5.10)
RDW: 12.9 % (ref 11.0–15.0)
WBC: 6.8 10*3/uL (ref 3.8–10.8)

## 2020-06-16 LAB — LIPID PANEL
Cholesterol: 167 mg/dL (ref <200)
HDL: 35 mg/dL — ABNORMAL LOW (ref >=50)
LDL Cholesterol (Calc): 103 mg/dL (calc) — ABNORMAL HIGH
Non-HDL Cholesterol (Calc): 132 mg/dL (calc) — ABNORMAL HIGH (ref <130)
Total CHOL/HDL Ratio: 4.8 (calc) (ref <5.0)
Triglycerides: 169 mg/dL — ABNORMAL HIGH (ref <150)

## 2020-06-16 LAB — COMPLETE METABOLIC PANEL WITH GFR
AG Ratio: 1.8 (calc) (ref 1.0–2.5)
ALT: 30 U/L — ABNORMAL HIGH (ref 6–29)
AST: 18 U/L (ref 10–30)
Albumin: 4.4 g/dL (ref 3.6–5.1)
Alkaline phosphatase (APISO): 54 U/L (ref 31–125)
BUN: 11 mg/dL (ref 7–25)
CO2: 30 mmol/L (ref 20–32)
Calcium: 9.2 mg/dL (ref 8.6–10.2)
Chloride: 103 mmol/L (ref 98–110)
Creat: 0.99 mg/dL (ref 0.50–1.10)
GFR, Est African American: 84 mL/min/{1.73_m2} (ref >=60)
GFR, Est Non African American: 73 mL/min/{1.73_m2} (ref >=60)
Globulin: 2.5 g/dL (calc) (ref 1.9–3.7)
Glucose, Bld: 87 mg/dL (ref 65–99)
Potassium: 3.8 mmol/L (ref 3.5–5.3)
Sodium: 141 mmol/L (ref 135–146)
Total Bilirubin: 0.8 mg/dL (ref 0.2–1.2)
Total Protein: 6.9 g/dL (ref 6.1–8.1)

## 2020-06-22 ENCOUNTER — Ambulatory Visit (INDEPENDENT_AMBULATORY_CARE_PROVIDER_SITE_OTHER): Payer: BC Managed Care – PPO | Admitting: Osteopathic Medicine

## 2020-06-22 ENCOUNTER — Other Ambulatory Visit: Payer: Self-pay

## 2020-06-22 VITALS — BP 113/74 | HR 84 | Temp 98.1°F | Resp 20 | Ht 66.0 in | Wt 198.0 lb

## 2020-06-22 DIAGNOSIS — F640 Transsexualism: Secondary | ICD-10-CM | POA: Diagnosis not present

## 2020-06-22 DIAGNOSIS — Z79899 Other long term (current) drug therapy: Secondary | ICD-10-CM | POA: Diagnosis not present

## 2020-06-22 DIAGNOSIS — IMO0001 Reserved for inherently not codable concepts without codable children: Secondary | ICD-10-CM

## 2020-06-22 MED ORDER — TESTOSTERONE CYPIONATE 100 MG/ML IM SOLN
100.0000 mg | Freq: Once | INTRAMUSCULAR | Status: AC
  Administered 2020-06-22: 100 mg via INTRAMUSCULAR

## 2020-06-22 NOTE — Progress Notes (Signed)
Established Patient Office Visit  Subjective:  Patient ID: Victoria Hill, adult    DOB: [birth year]  Age: 35 y.o. MRN: [id number]  CC:  Chief Complaint  Patient presents with  . Female to female transsexual person on hormone therapy    HPI Tempie Gibeault presents for a testosterone injection. Denies chest pain, shortness of breath, headaches, and problems with medication or mood changes.    Patient tolerated testosterone 100 mg injection to LUOQ well without complications. Patient will return in 1 week for next injection.   Past Medical History:  Diagnosis Date  . Anxiety   . Depression     Past Surgical History:  Procedure Laterality Date  . CHEST SURGERY      History reviewed. No pertinent family history.  Social History   Socioeconomic History  . Marital status: Single    Spouse name: Not on file  . Number of children: Not on file  . Years of education: Not on file  . Highest education level: Not on file  Occupational History  . Not on file  Tobacco Use  . Smoking status: Never Smoker  . Smokeless tobacco: Never Used  Vaping Use  . Vaping Use: Never used  Substance and Sexual Activity  . Alcohol use: Not Currently  . Drug use: Never  . Sexual activity: Never    Partners: Female  Other Topics Concern  . Not on file  Social History Narrative  . Not on file   Social Determinants of Health   Financial Resource Strain: Not on file  Food Insecurity: Not on file  Transportation Needs: Not on file  Physical Activity: Not on file  Stress: Not on file  Social Connections: Not on file  Intimate Partner Violence: Not on file    Outpatient Medications Prior to Visit  Medication Sig Dispense Refill  . Ascorbic Acid (VITAMIN C PO) Take 100 mg by mouth.    Marland Kitchen buPROPion (WELLBUTRIN XL) 150 MG 24 hr tablet Take 1 tablet (150 mg total) by mouth every morning. 90 tablet 3  . meloxicam (MOBIC) 15 MG tablet One tab PO qAM with a meal for 2 weeks, then daily prn pain. 30 tablet  3  . Multiple Vitamins-Minerals (ZINC PO) Take 50 mg by mouth.    . TESTOSTERONE CYPIONATE IM Inject 100 mg into the muscle every 7 (seven) days.    Marland Kitchen VITAMIN D PO Take 50 mg by mouth.     No facility-administered medications prior to visit.    No Known Allergies  ROS Review of Systems    Objective:    Physical Exam  BP 113/74 (BP Location: Left Arm, Patient Position: Sitting, Cuff Size: Normal)   Pulse 84   Temp 98.1 F (36.7 C) (Oral)   Resp 20   Ht 5\' 6"  (1.676 m)   Wt 198 lb (89.8 kg)   SpO2 99%   BMI 31.96 kg/m  Wt Readings from Last 3 Encounters:  06/22/20 198 lb (89.8 kg)  06/15/20 198 lb 1.3 oz (89.8 kg)  06/08/20 198 lb (89.8 kg)     Health Maintenance Due  Topic Date Due  . HIV Screening  Never done  . Hepatitis C Screening  Never done    There are no preventive care reminders to display for this patient.  No results found for: TSH Lab Results  Component Value Date   WBC 6.8 06/15/2020   HGB 15.7 (H) 06/15/2020   HCT 46.9 (H) 06/15/2020   MCV 89.5 06/15/2020  PLT 343 06/15/2020   Lab Results  Component Value Date   NA 141 06/15/2020   K 3.8 06/15/2020   CO2 30 06/15/2020   GLUCOSE 87 06/15/2020   BUN 11 06/15/2020   CREATININE 0.99 06/15/2020   BILITOT 0.8 06/15/2020   AST 18 06/15/2020   ALT 30 (H) 06/15/2020   PROT 6.9 06/15/2020   CALCIUM 9.2 06/15/2020   Lab Results  Component Value Date   CHOL 167 06/15/2020   Lab Results  Component Value Date   HDL 35 (L) 06/15/2020   Lab Results  Component Value Date   LDLCALC 103 (H) 06/15/2020   Lab Results  Component Value Date   TRIG 169 (H) 06/15/2020   Lab Results  Component Value Date   CHOLHDL 4.8 06/15/2020   No results found for: HGBA1C    Assessment & Plan:  Patient tolerated testosterone 100 mg injection to LUOQ well without complications. Patient will return in 1 week for next injection.  Problem List Items Addressed This Visit      Other   AFAB, FEMALE  TRANSGENDER - Primary      Meds ordered this encounter  Medications  . testosterone cypionate (DEPOTESTOTERONE CYPIONATE) injection 100 mg    Follow-up: Return in about 1 week (around 06/29/2020) for Testosterone Injection.    [name], CMA

## 2020-06-29 ENCOUNTER — Other Ambulatory Visit: Payer: Self-pay

## 2020-06-29 ENCOUNTER — Ambulatory Visit (INDEPENDENT_AMBULATORY_CARE_PROVIDER_SITE_OTHER): Payer: BC Managed Care – PPO | Admitting: Osteopathic Medicine

## 2020-06-29 VITALS — BP 116/75 | HR 89

## 2020-06-29 DIAGNOSIS — IMO0001 Reserved for inherently not codable concepts without codable children: Secondary | ICD-10-CM

## 2020-06-29 DIAGNOSIS — Z79899 Other long term (current) drug therapy: Secondary | ICD-10-CM | POA: Diagnosis not present

## 2020-06-29 DIAGNOSIS — F640 Transsexualism: Secondary | ICD-10-CM

## 2020-06-29 MED ORDER — TESTOSTERONE CYPIONATE 200 MG/ML IM SOLN
100.0000 mg | Freq: Once | INTRAMUSCULAR | Status: AC
  Administered 2020-06-29: 100 mg via INTRAMUSCULAR

## 2020-06-29 NOTE — Progress Notes (Signed)
Established Patient Office Visit  Subjective:  Patient ID: Victoria Hill, adult    DOB: December 05, 1985  Age: 35 y.o. MRN: [id number]  CC:  Chief Complaint  Patient presents with  . Injections    HPI Victoria Hill is here for a testosterone injection. Denies chest pain, shortness of breath, headaches or mood changes.   Past Medical History:  Diagnosis Date  . Anxiety   . Depression     Past Surgical History:  Procedure Laterality Date  . CHEST SURGERY      History reviewed. No pertinent family history.  Social History   Socioeconomic History  . Marital status: Single    Spouse name: Not on file  . Number of children: Not on file  . Years of education: Not on file  . Highest education level: Not on file  Occupational History  . Not on file  Tobacco Use  . Smoking status: Never Smoker  . Smokeless tobacco: Never Used  Vaping Use  . Vaping Use: Never used  Substance and Sexual Activity  . Alcohol use: Not Currently  . Drug use: Never  . Sexual activity: Never    Partners: Female  Other Topics Concern  . Not on file  Social History Narrative  . Not on file   Social Determinants of Health   Financial Resource Strain: Not on file  Food Insecurity: Not on file  Transportation Needs: Not on file  Physical Activity: Not on file  Stress: Not on file  Social Connections: Not on file  Intimate Partner Violence: Not on file    Outpatient Medications Prior to Visit  Medication Sig Dispense Refill  . Ascorbic Acid (VITAMIN C PO) Take 100 mg by mouth.    Marland Kitchen buPROPion (WELLBUTRIN XL) 150 MG 24 hr tablet Take 1 tablet (150 mg total) by mouth every morning. 90 tablet 3  . meloxicam (MOBIC) 15 MG tablet One tab PO qAM with a meal for 2 weeks, then daily prn pain. 30 tablet 3  . Multiple Vitamins-Minerals (ZINC PO) Take 50 mg by mouth.    . TESTOSTERONE CYPIONATE IM Inject 100 mg into the muscle every 7 (seven) days.    Marland Kitchen VITAMIN D PO Take 50 mg by mouth.     No  facility-administered medications prior to visit.    No Known Allergies  ROS Review of Systems    Objective:    Physical Exam  BP 116/75   Pulse 89   SpO2 100%  Wt Readings from Last 3 Encounters:  06/22/20 198 lb (89.8 kg)  06/15/20 198 lb 1.3 oz (89.8 kg)  06/08/20 198 lb (89.8 kg)     Health Maintenance Due  Topic Date Due  . HIV Screening  Never done  . Hepatitis C Screening  Never done    There are no preventive care reminders to display for this patient.  No results found for: TSH Lab Results  Component Value Date   WBC 6.8 06/15/2020   HGB 15.7 (H) 06/15/2020   HCT 46.9 (H) 06/15/2020   MCV 89.5 06/15/2020   PLT 343 06/15/2020   Lab Results  Component Value Date   NA 141 06/15/2020   K 3.8 06/15/2020   CO2 30 06/15/2020   GLUCOSE 87 06/15/2020   BUN 11 06/15/2020   CREATININE 0.99 06/15/2020   BILITOT 0.8 06/15/2020   AST 18 06/15/2020   ALT 30 (H) 06/15/2020   PROT 6.9 06/15/2020   CALCIUM 9.2 06/15/2020   Lab Results  Component Value Date   CHOL 167 06/15/2020   Lab Results  Component Value Date   HDL 35 (L) 06/15/2020   Lab Results  Component Value Date   LDLCALC 103 (H) 06/15/2020   Lab Results  Component Value Date   TRIG 169 (H) 06/15/2020   Lab Results  Component Value Date   CHOLHDL 4.8 06/15/2020   No results found for: HGBA1C    Assessment & Plan:  Testosterone injection - Patient tolerated injection well without complications. Patient advised to schedule next injection 7 days from today.   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 testosterone cypionate (DEPOTESTOSTERONE CYPIONATE) injection 100 mg    Follow-up: Return in about 1 week (around 07/06/2020) for Testosterone injection. [name], [name], [place]

## 2020-07-06 ENCOUNTER — Other Ambulatory Visit: Payer: Self-pay

## 2020-07-06 ENCOUNTER — Ambulatory Visit (INDEPENDENT_AMBULATORY_CARE_PROVIDER_SITE_OTHER): Payer: BC Managed Care – PPO | Admitting: Osteopathic Medicine

## 2020-07-06 VITALS — BP 125/76 | HR 84 | Resp 20 | Ht 66.0 in

## 2020-07-06 DIAGNOSIS — Z79899 Other long term (current) drug therapy: Secondary | ICD-10-CM | POA: Diagnosis not present

## 2020-07-06 DIAGNOSIS — IMO0001 Reserved for inherently not codable concepts without codable children: Secondary | ICD-10-CM

## 2020-07-06 DIAGNOSIS — F640 Transsexualism: Secondary | ICD-10-CM | POA: Diagnosis not present

## 2020-07-06 MED ORDER — TESTOSTERONE CYPIONATE 100 MG/ML IM SOLN
100.0000 mg | Freq: Once | INTRAMUSCULAR | Status: AC
  Administered 2020-07-06: 100 mg via INTRAMUSCULAR

## 2020-07-06 NOTE — Progress Notes (Signed)
Established Patient Office Visit  Subjective:  Patient ID: Victoria Hill, adult    DOB: 05/16/1985  Age: 35 y.o. MRN: [id number]  CC:  Chief Complaint  Patient presents with  . 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 Denies chest pain, shortness of breath, headaches, and problems with medication or mood changes.    Patient tolerated testosterone 100 mg injection to LUOQ well without complications. Patient advised to schedule next injection in one week.   Past Medical History:  Diagnosis Date  . Anxiety   . Depression     Past Surgical History:  Procedure Laterality Date  . CHEST SURGERY      History reviewed. No pertinent family history.  Social History   Socioeconomic History  . Marital status: Single    Spouse name: Not on file  . Number of children: Not on file  . Years of education: Not on file  . Highest education level: Not on file  Occupational History  . Not on file  Tobacco Use  . Smoking status: Never Smoker  . Smokeless tobacco: Never Used  Vaping Use  . Vaping Use: Never used  Substance and Sexual Activity  . Alcohol use: Not Currently  . Drug use: Never  . Sexual activity: Never    Partners: Female  Other Topics Concern  . Not on file  Social History Narrative  . Not on file   Social Determinants of Health   Financial Resource Strain: Not on file  Food Insecurity: Not on file  Transportation Needs: Not on file  Physical Activity: Not on file  Stress: Not on file  Social Connections: Not on file  Intimate Partner Violence: Not on file    Outpatient Medications Prior to Visit  Medication Sig Dispense Refill  . Ascorbic Acid (VITAMIN C PO) Take 100 mg by mouth.    Marland Kitchen buPROPion (WELLBUTRIN XL) 150 MG 24 hr tablet Take 1 tablet (150 mg total) by mouth every morning. 90 tablet 3  . meloxicam (MOBIC) 15 MG tablet One tab PO qAM with a meal for 2 weeks, then daily prn pain. 30  tablet 3  . Multiple Vitamins-Minerals (ZINC PO) Take 50 mg by mouth.    . TESTOSTERONE CYPIONATE IM Inject 100 mg into the muscle every 7 (seven) days.    Marland Kitchen VITAMIN D PO Take 50 mg by mouth.     No facility-administered medications prior to visit.    No Known Allergies  ROS Review of Systems    Objective:    Physical Exam  BP 125/76 (BP Location: Left Arm, Patient Position: Sitting, Cuff Size: Normal)   Pulse 84   Resp 20   Ht 5\' 6"  (1.676 m)   SpO2 99%   BMI 31.96 kg/m  Wt Readings from Last 3 Encounters:  06/22/20 198 lb (89.8 kg)  06/15/20 198 lb 1.3 oz (89.8 kg)  06/08/20 198 lb (89.8 kg)     Health Maintenance Due  Topic Date Due  . Pneumococcal Vaccine 28-88 Years old (1 of 4 - PCV13) Never done  . HIV Screening  Never done  . Hepatitis C Screening  Never done    There are no preventive care reminders to display for this patient.  No results found for: TSH Lab Results  Component Value Date   WBC 6.8 06/15/2020   HGB 15.7 (H) 06/15/2020   HCT 46.9 (H) 06/15/2020   MCV 89.5 06/15/2020  PLT 343 06/15/2020   Lab Results  Component Value Date   NA 141 06/15/2020   K 3.8 06/15/2020   CO2 30 06/15/2020   GLUCOSE 87 06/15/2020   BUN 11 06/15/2020   CREATININE 0.99 06/15/2020   BILITOT 0.8 06/15/2020   AST 18 06/15/2020   ALT 30 (H) 06/15/2020   PROT 6.9 06/15/2020   CALCIUM 9.2 06/15/2020   Lab Results  Component Value Date   CHOL 167 06/15/2020   Lab Results  Component Value Date   HDL 35 (L) 06/15/2020   Lab Results  Component Value Date   LDLCALC 103 (H) 06/15/2020   Lab Results  Component Value Date   TRIG 169 (H) 06/15/2020   Lab Results  Component Value Date   CHOLHDL 4.8 06/15/2020   No results found for: HGBA1C    Assessment & Plan:  Patient tolerated testosterone 100 mg injection to LUOQ well without complications. Patient advised to schedule next injection in one week.  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 testosterone cypionate (DEPOTESTOTERONE CYPIONATE) injection 100 mg    Follow-up: Return in about 1 week (around 07/13/2020) for Testosterone Injection.    [name], CMA

## 2020-07-07 ENCOUNTER — Other Ambulatory Visit: Payer: Self-pay | Admitting: Obstetrics

## 2020-07-07 ENCOUNTER — Ambulatory Visit: Payer: BC Managed Care – PPO | Admitting: Women's Health

## 2020-07-07 VITALS — BP 143/87 | HR 85 | Wt 196.6 lb

## 2020-07-07 DIAGNOSIS — R102 Pelvic and perineal pain: Secondary | ICD-10-CM | POA: Diagnosis not present

## 2020-07-07 NOTE — Progress Notes (Signed)
Dull burning pain that radiates out in suprapubic/ low abd area. Has had for years, but has gotten much worse recently. No LMP since 2020. No vaginal irritation, odor, discharge.

## 2020-07-07 NOTE — Patient Instructions (Addendum)
Pelvic Pain Pelvic pain is pain in your lower abdomen, below your belly button and between your hips. The pain may start suddenly (be acute), keep coming back (be recurring), or last a long time (become chronic). Pelvic pain that lasts longer than 6 months is considered chronic. Pelvic pain may affect your:  Reproductive organs.  Urinary system.  Digestive tract.  Musculoskeletal system. There are many potential causes of pelvic pain. Sometimes, the pain can be a result of digestive or urinary conditions, strained muscles or ligaments, or reproductive conditions. Sometimes the cause of pelvic pain is not known. Follow these instructions at home:  Take over-the-counter and prescription medicines only as told by your health care provider.  Rest as told by your health care provider.  Do not have sex if it hurts.  Keep a journal of your pelvic pain. Write down: ? When the pain started. ? Where the pain is located. ? What seems to make the pain better or worse, such as food. ? Any symptoms you have along with the pain.  Keep all follow-up visits as told by your health care provider. This is important.   Contact a health care provider if:  Medicine does not help your pain.  Your pain comes back.  You have new symptoms.  You have abnormal discharge or bleeding.  You have a fever or chills.  You are constipated.  You have blood in your urine or stool.  You have foul-smelling urine.  You feel weak or light-headed. Get help right away if:  You have sudden severe pain.  Your pain gets steadily worse.  You have severe pain along with fever, nausea, vomiting, or excessive sweating.  You lose consciousness. Summary  Pelvic pain is pain in your lower abdomen, below your belly button and between your hips.  There are many potential causes of pelvic pain.  Keep a journal of your pelvic pain. This information is not intended to replace advice given to you by your health  care provider. Make sure you discuss any questions you have with your health care provider. Document Revised: 07/04/2017 Document Reviewed: 07/04/2017 Elsevier Patient Education  [place].

## 2020-07-07 NOTE — Progress Notes (Signed)
History:  Mr. Aaisha Sliter is a 35 y.o. who presents to clinic today for pelvic pain that initially started "a long time ago" as an "internal pinch" that was not bothersome. Patient reports the pain really increased about one month ago and sometimes is disabling to the point where he is doubled over in pain. Patient denies any new medications or changes in lifestyle and patient is asexual and does not have any new partners. Patient describes the pain as mostly midline, spans about the size of a palm, starts internally and radiates outwards. Patient reports that when he is trying to have an orgasm, that makes the pain worse. Patient further describes the pain as dull and broad. Patient reports the pain is intermittent and other than before orgasm, cannot identify any precipitating factors. Patient reports the pain comes on suddenly and then gradually resolves over 1-2 minutes. Patient reports the pain can last as long as 5 minutes. Patient reports it only occurs once per day and he can go several days without feeling this pain. Patient reports he has never had sex before.  Patient reports no PMH other than anxiety/depression. Patient reports he takes Vitamin C, Wellbutrin XL (anxiety/depression), multivitamin, testosterone injections weekly and Vitamin D. Patient reports only surgery in the past is top surgery in 2018. Patient reports he still has his uterus and vagina.  The following portions of the patient's history were reviewed and updated as appropriate: allergies, current medications, family history, past medical history, social history, past surgical history and problem list.  Review of Systems:  Review of Systems  Gastrointestinal: Positive for abdominal pain (lower). Negative for nausea and vomiting.  Genitourinary: Negative for dysuria, flank pain, frequency and urgency.       Pelvic pain, midline with excursions right and left.     Objective:  Physical Exam BP (!) 143/87   Pulse 85   Wt  196 lb 9.6 oz (89.2 kg)   BMI 31.73 kg/m    Physical Exam Vitals and nursing note reviewed.  Constitutional:      General: He is not in acute distress.    Appearance: Normal appearance. He is not ill-appearing, toxic-appearing or diaphoretic.  HENT:     Head: Normocephalic and atraumatic.  Pulmonary:     Effort: Pulmonary effort is normal.  Abdominal:     General: Abdomen is flat. There is no distension.     Palpations: Abdomen is soft. There is no mass.     Tenderness: There is no abdominal tenderness. There is no guarding or rebound.     Hernia: No hernia is present.  Genitourinary:    Comments: Pt declines pelvic exam at this time. Discussed that may be necessary in the future to rule out causes of pain. Pt verbalizes understanding and declines exam today. Skin:    General: Skin is warm and dry.  Neurological:     Mental Status: He is alert and oriented to person, place, and time.  Psychiatric:        Mood and Affect: Mood normal.        Behavior: Behavior normal.        Thought Content: Thought content normal.        Judgment: Judgment normal.    Labs and Imaging No results found for this or any previous visit (from the past 24 hour(s)).  No results found.  Health Maintenance Due  Topic Date Due  . Pneumococcal Vaccine 67-34 Years old (1 of 4 - PCV13) Never done  .  HIV Screening  Never done  . Hepatitis C Screening  Never done    Assessment & Plan:  1. Pelvic pain - pt has never had intercourse, STI testing not indicated - no abnormal discharge or bleeding, WetPrep not indicated - Urine Culture - US PELVIC COMPLETE WITH INTERNAL; Future - pt asked to keep journal of dietary intake, what he is doing when pain occurs, bowel movements and other relevant information for the next week to see if patterns can be identified as to precipitating factors for pain - pt desires surgical consult, pending above results, to discuss removal of pelvic organs for next  visit  Approximately 10 minutes of total time was spent with this patient on counseling and coordination of care.  Clarisa Fling, NP 07/07/2020 10:43 AM

## 2020-07-12 ENCOUNTER — Other Ambulatory Visit: Payer: Self-pay | Admitting: Women's Health

## 2020-07-12 ENCOUNTER — Other Ambulatory Visit: Payer: Self-pay

## 2020-07-12 ENCOUNTER — Ambulatory Visit (HOSPITAL_BASED_OUTPATIENT_CLINIC_OR_DEPARTMENT_OTHER)
Admission: RE | Admit: 2020-07-12 | Discharge: 2020-07-12 | Disposition: A | Discharge status: Home / Self Care | Payer: BC Managed Care – PPO | Source: Ambulatory Visit | Attending: Women's Health | Admitting: Women's Health

## 2020-07-12 DIAGNOSIS — R102 Pelvic and perineal pain: Secondary | ICD-10-CM | POA: Diagnosis not present

## 2020-07-12 DIAGNOSIS — R1031 Right lower quadrant pain: Secondary | ICD-10-CM | POA: Diagnosis not present

## 2020-07-12 LAB — URINE CULTURE

## 2020-07-13 ENCOUNTER — Telehealth: Payer: Self-pay | Admitting: Women's Health

## 2020-07-13 ENCOUNTER — Ambulatory Visit (INDEPENDENT_AMBULATORY_CARE_PROVIDER_SITE_OTHER): Payer: BC Managed Care – PPO | Admitting: Osteopathic Medicine

## 2020-07-13 VITALS — BP 123/68 | HR 92 | Resp 20 | Ht 66.0 in | Wt 196.0 lb

## 2020-07-13 DIAGNOSIS — Z79899 Other long term (current) drug therapy: Secondary | ICD-10-CM | POA: Diagnosis not present

## 2020-07-13 DIAGNOSIS — F640 Transsexualism: Secondary | ICD-10-CM

## 2020-07-13 DIAGNOSIS — IMO0001 Reserved for inherently not codable concepts without codable children: Secondary | ICD-10-CM

## 2020-07-13 MED ORDER — TESTOSTERONE CYPIONATE 100 MG/ML IM SOLN
100.0000 mg | Freq: Once | INTRAMUSCULAR | Status: AC
  Administered 2020-07-13: 100 mg via INTRAMUSCULAR

## 2020-07-13 NOTE — Telephone Encounter (Signed)
Attempted to call pt to discuss [place] results. No answer, HIPAA compliant VM left. Will send MyChart message with further instructions.  Clarisa Fling, NP  10:35 AM 07/13/2020

## 2020-07-13 NOTE — Progress Notes (Signed)
Established Patient Office Visit  Subjective:  Patient ID: Victoria Hill, adult    DOB: [birth year]  Age: 35 y.o. MRN: [id number]  CC:  Chief Complaint  Patient presents with   Testosterone Injection    HPI Victoria Hill presents for testosterone injection. Denies chest pain, shortness of breath, headaches, and problems with medication or mood changes.    Patient tolerated testosterone 100 mg injection to RUOQ well without complications. Patient advised to schedule next injection in one week.    Past Medical History:  Diagnosis Date   Anxiety    Depression     Past Surgical History:  Procedure Laterality Date   CHEST SURGERY      History reviewed. No pertinent family history.  Social History   Socioeconomic History   Marital status: Single    Spouse name: Not on file   Number of children: Not on file   Years of education: Not on file   Highest education level: Not on file  Occupational History   Not on file  Tobacco Use   Smoking status: Never   Smokeless tobacco: Never  Vaping Use   Vaping Use: Never used  Substance and Sexual Activity   Alcohol use: Not Currently   Drug use: Never   Sexual activity: Never    Partners: Female  Other Topics Concern   Not on file  Social History Narrative   Not on file   Social Determinants of Health   Financial Resource Strain: Not on file  Food Insecurity: Not on file  Transportation Needs: Not on file  Physical Activity: Not on file  Stress: Not on file  Social Connections: Not on file  Intimate Partner Violence: Not on file    Outpatient Medications Prior to Visit  Medication Sig Dispense Refill   Ascorbic Acid (VITAMIN C PO) Take 100 mg by mouth.     buPROPion (WELLBUTRIN XL) 150 MG 24 hr tablet Take 1 tablet (150 mg total) by mouth every morning. 90 tablet 3   meloxicam (MOBIC) 15 MG tablet One tab PO qAM with a meal for 2 weeks, then daily prn pain. (Patient not taking: Reported on 07/07/2020) 30 tablet 3   Multiple  Vitamins-Minerals (ZINC PO) Take 50 mg by mouth.     TESTOSTERONE CYPIONATE IM Inject 100 mg into the muscle every 7 (seven) days.     VITAMIN D PO Take 50 mg by mouth.     No facility-administered medications prior to visit.    No Known Allergies  ROS Review of Systems    Objective:    Physical Exam  BP 123/68 (BP Location: Left Arm, Patient Position: Sitting, Cuff Size: Large)   Pulse 92   Resp 20   Ht 5\' 6"  (1.676 m)   Wt 196 lb (88.9 kg)   SpO2 99%   BMI 31.64 kg/m  Wt Readings from Last 3 Encounters:  07/13/20 196 lb (88.9 kg)  07/07/20 196 lb 9.6 oz (89.2 kg)  06/22/20 198 lb (89.8 kg)     Health Maintenance Due  Topic Date Due   Pneumococcal Vaccine 22-82 Years old (1 - PCV) Never done   HIV Screening  Never done   Hepatitis C Screening  Never done   Zoster Vaccines- Shingrix (1 of 2) Never done   COVID-19 Vaccine (3 - Booster for Janssen series) 01/22/2020    There are no preventive care reminders to display for this patient.  No results found for: TSH Lab Results  Component Value  Date   WBC 6.8 06/15/2020   HGB 15.7 (H) 06/15/2020   HCT 46.9 (H) 06/15/2020   MCV 89.5 06/15/2020   PLT 343 06/15/2020   Lab Results  Component Value Date   NA 141 06/15/2020   K 3.8 06/15/2020   CO2 30 06/15/2020   GLUCOSE 87 06/15/2020   BUN 11 06/15/2020   CREATININE 0.99 06/15/2020   BILITOT 0.8 06/15/2020   AST 18 06/15/2020   ALT 30 (H) 06/15/2020   PROT 6.9 06/15/2020   CALCIUM 9.2 06/15/2020   Lab Results  Component Value Date   CHOL 167 06/15/2020   Lab Results  Component Value Date   HDL 35 (L) 06/15/2020   Lab Results  Component Value Date   LDLCALC 103 (H) 06/15/2020   Lab Results  Component Value Date   TRIG 169 (H) 06/15/2020   Lab Results  Component Value Date   CHOLHDL 4.8 06/15/2020   No results found for: HGBA1C    Assessment & Plan:  Patient tolerated testosterone 100 mg injection to RUOQ well without complications. Patient  advised to schedule next injection in one week.   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    [name], CMA

## 2020-07-16 ENCOUNTER — Other Ambulatory Visit: Payer: Self-pay | Admitting: Obstetrics

## 2020-07-16 ENCOUNTER — Telehealth: Payer: Self-pay

## 2020-07-16 DIAGNOSIS — N3000 Acute cystitis without hematuria: Secondary | ICD-10-CM

## 2020-07-16 MED ORDER — AMOXICILLIN-POT CLAVULANATE 875-125 MG PO TABS: 1.0000 | ORAL_TABLET | Freq: Two times a day (BID) | ORAL | 0 refills | Status: DC

## 2020-07-16 NOTE — Telephone Encounter (Signed)
Call patient to inform her of test results and recommendations.

## 2020-07-16 NOTE — Telephone Encounter (Signed)
-----   Message from Shelly Bombard, MD sent at 07/16/2020  8:45 AM EDT ----- Augmentin Rx for UTI

## 2020-07-20 ENCOUNTER — Ambulatory Visit (INDEPENDENT_AMBULATORY_CARE_PROVIDER_SITE_OTHER): Payer: BC Managed Care – PPO | Admitting: Osteopathic Medicine

## 2020-07-20 VITALS — BP 118/77 | HR 78 | Resp 20 | Ht 66.0 in | Wt 196.0 lb

## 2020-07-20 DIAGNOSIS — Z79899 Other long term (current) drug therapy: Secondary | ICD-10-CM | POA: Diagnosis not present

## 2020-07-20 DIAGNOSIS — IMO0001 Reserved for inherently not codable concepts without codable children: Secondary | ICD-10-CM

## 2020-07-20 DIAGNOSIS — F640 Transsexualism: Secondary | ICD-10-CM

## 2020-07-20 MED ORDER — TESTOSTERONE CYPIONATE 100 MG/ML IM SOLN
100.0000 mg | Freq: Once | INTRAMUSCULAR | Status: AC
  Administered 2020-07-20: 100 mg via INTRAMUSCULAR

## 2020-07-20 NOTE — Progress Notes (Signed)
Established Patient Office Visit  Subjective:  Patient ID: Victoria Hill, adult    DOB: Dec 22, 1985  Age: 35 y.o. MRN: [id number]  CC:  Chief Complaint  Patient presents with   Female to female transsexual person on hormone therapy    HPI Victoria Hill presents for a testosterone injection. Denies chest pain, shortness of breath, headaches, and problems with medication or mood changes.    Patient tolerated testosterone 100 mg injection to LUOQ well without complications. Patient advised to schedule next injection in one week.    Past Medical History:  Diagnosis Date   Anxiety    Depression     Past Surgical History:  Procedure Laterality Date   CHEST SURGERY      History reviewed. No pertinent family history.  Social History   Socioeconomic History   Marital status: Single    Spouse name: Not on file   Number of children: Not on file   Years of education: Not on file   Highest education level: Not on file  Occupational History   Not on file  Tobacco Use   Smoking status: Never   Smokeless tobacco: Never  Vaping Use   Vaping Use: Never used  Substance and Sexual Activity   Alcohol use: Not Currently   Drug use: Never   Sexual activity: Never    Partners: Female  Other Topics Concern   Not on file  Social History Narrative   Not on file   Social Determinants of Health   Financial Resource Strain: Not on file  Food Insecurity: Not on file  Transportation Needs: Not on file  Physical Activity: Not on file  Stress: Not on file  Social Connections: Not on file  Intimate Partner Violence: Not on file    Outpatient Medications Prior to Visit  Medication Sig Dispense Refill   amoxicillin-clavulanate (AUGMENTIN) 875-125 MG tablet Take 1 tablet by mouth 2 (two) times daily. 14 tablet 0   Ascorbic Acid (VITAMIN C PO) Take 100 mg by mouth.     buPROPion (WELLBUTRIN XL) 150 MG 24 hr tablet Take 1 tablet (150 mg total) by mouth every morning. 90 tablet 3   meloxicam  (MOBIC) 15 MG tablet One tab PO qAM with a meal for 2 weeks, then daily prn pain. (Patient not taking: Reported on 07/07/2020) 30 tablet 3   Multiple Vitamins-Minerals (ZINC PO) Take 50 mg by mouth.     TESTOSTERONE CYPIONATE IM Inject 100 mg into the muscle every 7 (seven) days.     VITAMIN D PO Take 50 mg by mouth.     No facility-administered medications prior to visit.    No Known Allergies  ROS Review of Systems    Objective:    Physical Exam  BP 118/77 (BP Location: Left Arm, Patient Position: Sitting, Cuff Size: Large)   Pulse 78   Resp 20   Ht 5\' 6"  (1.676 m)   Wt 196 lb (88.9 kg)   SpO2 100%   BMI 31.64 kg/m  Wt Readings from Last 3 Encounters:  07/20/20 196 lb (88.9 kg)  07/13/20 196 lb (88.9 kg)  07/07/20 196 lb 9.6 oz (89.2 kg)     Health Maintenance Due  Topic Date Due   Pneumococcal Vaccine 66-39 Years old (1 - PCV) Never done   HIV Screening  Never done   Hepatitis C Screening  Never done   COVID-19 Vaccine (3 - Booster for Janssen series) 01/22/2020    There are no preventive care reminders  to display for this patient.  No results found for: TSH Lab Results  Component Value Date   WBC 6.8 06/15/2020   HGB 15.7 (H) 06/15/2020   HCT 46.9 (H) 06/15/2020   MCV 89.5 06/15/2020   PLT 343 06/15/2020   Lab Results  Component Value Date   NA 141 06/15/2020   K 3.8 06/15/2020   CO2 30 06/15/2020   GLUCOSE 87 06/15/2020   BUN 11 06/15/2020   CREATININE 0.99 06/15/2020   BILITOT 0.8 06/15/2020   AST 18 06/15/2020   ALT 30 (H) 06/15/2020   PROT 6.9 06/15/2020   CALCIUM 9.2 06/15/2020   Lab Results  Component Value Date   CHOL 167 06/15/2020   Lab Results  Component Value Date   HDL 35 (L) 06/15/2020   Lab Results  Component Value Date   LDLCALC 103 (H) 06/15/2020   Lab Results  Component Value Date   TRIG 169 (H) 06/15/2020   Lab Results  Component Value Date   CHOLHDL 4.8 06/15/2020   No results found for: HGBA1C    Assessment  & Plan:  Patient tolerated testosterone 100 mg injection to LUOQ well without complications. Patient advised to schedule next injection in one week.   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    [name], CMA

## 2020-07-27 ENCOUNTER — Other Ambulatory Visit: Payer: Self-pay

## 2020-07-27 ENCOUNTER — Ambulatory Visit (INDEPENDENT_AMBULATORY_CARE_PROVIDER_SITE_OTHER): Payer: BC Managed Care – PPO | Admitting: Osteopathic Medicine

## 2020-07-27 VITALS — BP 105/71 | HR 84

## 2020-07-27 DIAGNOSIS — F640 Transsexualism: Secondary | ICD-10-CM | POA: Diagnosis not present

## 2020-07-27 DIAGNOSIS — Z79899 Other long term (current) drug therapy: Secondary | ICD-10-CM

## 2020-07-27 DIAGNOSIS — IMO0001 Reserved for inherently not codable concepts without codable children: Secondary | ICD-10-CM

## 2020-07-27 MED ORDER — TESTOSTERONE CYPIONATE 200 MG/ML IM SOLN
100.0000 mg | Freq: Once | INTRAMUSCULAR | Status: AC
  Administered 2020-07-27: 100 mg via INTRAMUSCULAR

## 2020-07-27 NOTE — Progress Notes (Signed)
Established Patient Office Visit  Subjective:  Patient ID: Victoria Hill, adult    DOB: 03-25-1985  Age: 35 y.o. MRN: [id number]  CC:  Chief Complaint  Patient presents with   Injections    HPI Victoria Hill is here for a testosterone injection. Denies chest pain, shortness of breath, headaches or mood changes.    Past Medical History:  Diagnosis Date   Anxiety    Depression     Past Surgical History:  Procedure Laterality Date   CHEST SURGERY      History reviewed. No pertinent family history.  Social History   Socioeconomic History   Marital status: Single    Spouse name: Not on file   Number of children: Not on file   Years of education: Not on file   Highest education level: Not on file  Occupational History   Not on file  Tobacco Use   Smoking status: Never   Smokeless tobacco: Never  Vaping Use   Vaping Use: Never used  Substance and Sexual Activity   Alcohol use: Not Currently   Drug use: Never   Sexual activity: Never    Partners: Female  Other Topics Concern   Not on file  Social History Narrative   Not on file   Social Determinants of Health   Financial Resource Strain: Not on file  Food Insecurity: Not on file  Transportation Needs: Not on file  Physical Activity: Not on file  Stress: Not on file  Social Connections: Not on file  Intimate Partner Violence: Not on file    Outpatient Medications Prior to Visit  Medication Sig Dispense Refill   amoxicillin-clavulanate (AUGMENTIN) 875-125 MG tablet Take 1 tablet by mouth 2 (two) times daily. 14 tablet 0   Ascorbic Acid (VITAMIN C PO) Take 100 mg by mouth.     buPROPion (WELLBUTRIN XL) 150 MG 24 hr tablet Take 1 tablet (150 mg total) by mouth every morning. 90 tablet 3   meloxicam (MOBIC) 15 MG tablet One tab PO qAM with a meal for 2 weeks, then daily prn pain. 30 tablet 3   Multiple Vitamins-Minerals (ZINC PO) Take 50 mg by mouth.     TESTOSTERONE CYPIONATE IM Inject 100 mg into the muscle every  7 (seven) days.     VITAMIN D PO Take 50 mg by mouth.     No facility-administered medications prior to visit.    No Known Allergies  ROS Review of Systems    Objective:    Physical Exam  BP 105/71   Pulse 84   SpO2 100%  Wt Readings from Last 3 Encounters:  07/20/20 196 lb (88.9 kg)  07/13/20 196 lb (88.9 kg)  07/07/20 196 lb 9.6 oz (89.2 kg)     Health Maintenance Due  Topic Date Due   Pneumococcal Vaccine 94-31 Years old (1 - PCV) Never done   HIV Screening  Never done   Hepatitis C Screening  Never done   COVID-19 Vaccine (3 - Booster for Janssen series) 01/22/2020    There are no preventive care reminders to display for this patient.  No results found for: TSH Lab Results  Component Value Date   WBC 6.8 06/15/2020   HGB 15.7 (H) 06/15/2020   HCT 46.9 (H) 06/15/2020   MCV 89.5 06/15/2020   PLT 343 06/15/2020   Lab Results  Component Value Date   NA 141 06/15/2020   K 3.8 06/15/2020   CO2 30 06/15/2020   GLUCOSE 87 06/15/2020  BUN 11 06/15/2020   CREATININE 0.99 06/15/2020   BILITOT 0.8 06/15/2020   AST 18 06/15/2020   ALT 30 (H) 06/15/2020   PROT 6.9 06/15/2020   CALCIUM 9.2 06/15/2020   Lab Results  Component Value Date   CHOL 167 06/15/2020   Lab Results  Component Value Date   HDL 35 (L) 06/15/2020   Lab Results  Component Value Date   LDLCALC 103 (H) 06/15/2020   Lab Results  Component Value Date   TRIG 169 (H) 06/15/2020   Lab Results  Component Value Date   CHOLHDL 4.8 06/15/2020   No results found for: HGBA1C    Assessment & Plan:  Injection - Patient tolerated injection well without complications. Patient advised to schedule next injection 7 days from today.    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 [name], [name], [place]

## 2020-08-03 ENCOUNTER — Ambulatory Visit: Payer: BC Managed Care – PPO

## 2020-08-05 ENCOUNTER — Other Ambulatory Visit: Payer: Self-pay

## 2020-08-05 ENCOUNTER — Ambulatory Visit (INDEPENDENT_AMBULATORY_CARE_PROVIDER_SITE_OTHER): Payer: BC Managed Care – PPO | Admitting: Osteopathic Medicine

## 2020-08-05 VITALS — BP 117/74 | HR 72

## 2020-08-05 DIAGNOSIS — F640 Transsexualism: Secondary | ICD-10-CM | POA: Diagnosis not present

## 2020-08-05 DIAGNOSIS — Z79899 Other long term (current) drug therapy: Secondary | ICD-10-CM | POA: Diagnosis not present

## 2020-08-05 DIAGNOSIS — IMO0001 Reserved for inherently not codable concepts without codable children: Secondary | ICD-10-CM

## 2020-08-05 MED ORDER — TESTOSTERONE CYPIONATE 200 MG/ML IM SOLN
100.0000 mg | Freq: Once | INTRAMUSCULAR | Status: AC
  Administered 2020-08-05: 100 mg via INTRAMUSCULAR

## 2020-08-05 NOTE — Progress Notes (Signed)
Established Patient Office Visit  Subjective:  Patient ID: Victoria Hill, adult    DOB: 1985/02/28  Age: 35 y.o. MRN: [id number]  CC:  Chief Complaint  Patient presents with   Injections    HPI Victoria Hill is here for a testosterone injection. Denies chest pain, shortness of breath, headaches or mood changes.    Past Medical History:  Diagnosis Date   Anxiety    Depression     Past Surgical History:  Procedure Laterality Date   CHEST SURGERY      History reviewed. No pertinent family history.  Social History   Socioeconomic History   Marital status: Single    Spouse name: Not on file   Number of children: Not on file   Years of education: Not on file   Highest education level: Not on file  Occupational History   Not on file  Tobacco Use   Smoking status: Never   Smokeless tobacco: Never  Vaping Use   Vaping Use: Never used  Substance and Sexual Activity   Alcohol use: Not Currently   Drug use: Never   Sexual activity: Never    Partners: Female  Other Topics Concern   Not on file  Social History Narrative   Not on file   Social Determinants of Health   Financial Resource Strain: Not on file  Food Insecurity: Not on file  Transportation Needs: Not on file  Physical Activity: Not on file  Stress: Not on file  Social Connections: Not on file  Intimate Partner Violence: Not on file    Outpatient Medications Prior to Visit  Medication Sig Dispense Refill   Ascorbic Acid (VITAMIN C PO) Take 100 mg by mouth.     buPROPion (WELLBUTRIN XL) 150 MG 24 hr tablet Take 1 tablet (150 mg total) by mouth every morning. 90 tablet 3   meloxicam (MOBIC) 15 MG tablet One tab PO qAM with a meal for 2 weeks, then daily prn pain. 30 tablet 3   Multiple Vitamins-Minerals (ZINC PO) Take 50 mg by mouth.     TESTOSTERONE CYPIONATE IM Inject 100 mg into the muscle every 7 (seven) days.     VITAMIN D PO Take 50 mg by mouth.     amoxicillin-clavulanate (AUGMENTIN) 875-125 MG  tablet Take 1 tablet by mouth 2 (two) times daily. 14 tablet 0   No facility-administered medications prior to visit.    No Known Allergies  ROS Review of Systems    Objective:    Physical Exam  BP 117/74   Pulse 72   SpO2 100%  Wt Readings from Last 3 Encounters:  07/20/20 196 lb (88.9 kg)  07/13/20 196 lb (88.9 kg)  07/07/20 196 lb 9.6 oz (89.2 kg)     Health Maintenance Due  Topic Date Due   Pneumococcal Vaccine 67-15 Years old (1 - PCV) Never done   HIV Screening  Never done   Hepatitis C Screening  Never done   COVID-19 Vaccine (3 - Booster for Janssen series) 01/22/2020    There are no preventive care reminders to display for this patient.  No results found for: TSH Lab Results  Component Value Date   WBC 6.8 06/15/2020   HGB 15.7 (H) 06/15/2020   HCT 46.9 (H) 06/15/2020   MCV 89.5 06/15/2020   PLT 343 06/15/2020   Lab Results  Component Value Date   NA 141 06/15/2020   K 3.8 06/15/2020   CO2 30 06/15/2020   GLUCOSE 87 06/15/2020  BUN 11 06/15/2020   CREATININE 0.99 06/15/2020   BILITOT 0.8 06/15/2020   AST 18 06/15/2020   ALT 30 (H) 06/15/2020   PROT 6.9 06/15/2020   CALCIUM 9.2 06/15/2020   Lab Results  Component Value Date   CHOL 167 06/15/2020   Lab Results  Component Value Date   HDL 35 (L) 06/15/2020   Lab Results  Component Value Date   LDLCALC 103 (H) 06/15/2020   Lab Results  Component Value Date   TRIG 169 (H) 06/15/2020   Lab Results  Component Value Date   CHOLHDL 4.8 06/15/2020   No results found for: HGBA1C    Assessment & Plan:  Testosterone injection - Patient tolerated injection well without complications. Patient advised to schedule next injection 7 days from today.    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

## 2020-08-10 ENCOUNTER — Other Ambulatory Visit: Payer: Self-pay

## 2020-08-10 ENCOUNTER — Ambulatory Visit (INDEPENDENT_AMBULATORY_CARE_PROVIDER_SITE_OTHER): Payer: BC Managed Care – PPO | Admitting: Osteopathic Medicine

## 2020-08-10 VITALS — BP 114/65 | HR 71 | Resp 20 | Wt 196.0 lb

## 2020-08-10 DIAGNOSIS — F640 Transsexualism: Secondary | ICD-10-CM | POA: Diagnosis not present

## 2020-08-10 DIAGNOSIS — Z79899 Other long term (current) drug therapy: Secondary | ICD-10-CM

## 2020-08-10 DIAGNOSIS — IMO0001 Reserved for inherently not codable concepts without codable children: Secondary | ICD-10-CM

## 2020-08-10 MED ORDER — TESTOSTERONE CYPIONATE 200 MG/ML IM SOLN
100.0000 mg | Freq: Once | INTRAMUSCULAR | Status: AC
  Administered 2020-08-10: 100 mg via INTRAMUSCULAR

## 2020-08-10 NOTE — Progress Notes (Signed)
Established Patient Office Visit  Subjective:  Patient ID: Victoria Hill, adult    DOB: 05/27/1985  Age: 35 y.o. 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 Denies chest pain, shortness of breath, headaches, and problems with medication or mood changes.    Patient tolerated testosterone 100 mg injection to RUOQ well without complications. Patient advised to schedule next injection in one week.    Past Medical History:  Diagnosis Date   Anxiety    Depression     Past Surgical History:  Procedure Laterality Date   CHEST SURGERY      History reviewed. No pertinent family history.  Social History   Socioeconomic History   Marital status: Single    Spouse name: Not on file   Number of children: Not on file   Years of education: Not on file   Highest education level: Not on file  Occupational History   Not on file  Tobacco Use   Smoking status: Never   Smokeless tobacco: Never  Vaping Use   Vaping Use: Never used  Substance and Sexual Activity   Alcohol use: Not Currently   Drug use: Never   Sexual activity: Never    Partners: Female  Other Topics Concern   Not on file  Social History Narrative   Not on file   Social Determinants of Health   Financial Resource Strain: Not on file  Food Insecurity: Not on file  Transportation Needs: Not on file  Physical Activity: Not on file  Stress: Not on file  Social Connections: Not on file  Intimate Partner Violence: Not on file    Outpatient Medications Prior to Visit  Medication Sig Dispense Refill   Ascorbic Acid (VITAMIN C PO) Take 100 mg by mouth.     buPROPion (WELLBUTRIN XL) 150 MG 24 hr tablet Take 1 tablet (150 mg total) by mouth every morning. 90 tablet 3   meloxicam (MOBIC) 15 MG tablet One tab PO qAM with a meal for 2 weeks, then daily prn pain. 30 tablet 3   Multiple  Vitamins-Minerals (ZINC PO) Take 50 mg by mouth.     TESTOSTERONE CYPIONATE IM Inject 100 mg into the muscle every 7 (seven) days.     VITAMIN D PO Take 50 mg by mouth.     No facility-administered medications prior to visit.    No Known Allergies  ROS Review of Systems    Objective:    Physical Exam  BP 114/65 (BP Location: Left Arm, Patient Position: Sitting, Cuff Size: Normal)   Pulse 71   Resp 20   Wt 196 lb (88.9 kg)   SpO2 100%   BMI 31.64 kg/m  Wt Readings from Last 3 Encounters:  08/10/20 196 lb (88.9 kg)  07/20/20 196 lb (88.9 kg)  07/13/20 196 lb (88.9 kg)     Health Maintenance Due  Topic Date Due   Pneumococcal Vaccine 1-18 Years old (1 - PCV) Never done   HIV Screening  Never done   Hepatitis C Screening  Never done   COVID-19 Vaccine (3 - Booster for Janssen series) 01/22/2020    There are no preventive care reminders to display for this patient.  No results found for: TSH Lab Results  Component Value Date   WBC 6.8 06/15/2020   HGB 15.7 (H) 06/15/2020   HCT 46.9 (H) 06/15/2020  MCV 89.5 06/15/2020   PLT 343 06/15/2020   Lab Results  Component Value Date   NA 141 06/15/2020   K 3.8 06/15/2020   CO2 30 06/15/2020   GLUCOSE 87 06/15/2020   BUN 11 06/15/2020   CREATININE 0.99 06/15/2020   BILITOT 0.8 06/15/2020   AST 18 06/15/2020   ALT 30 (H) 06/15/2020   PROT 6.9 06/15/2020   CALCIUM 9.2 06/15/2020   Lab Results  Component Value Date   CHOL 167 06/15/2020   Lab Results  Component Value Date   HDL 35 (L) 06/15/2020   Lab Results  Component Value Date   LDLCALC 103 (H) 06/15/2020   Lab Results  Component Value Date   TRIG 169 (H) 06/15/2020   Lab Results  Component Value Date   CHOLHDL 4.8 06/15/2020   No results found for: HGBA1C    Assessment & Plan:  Patient tolerated testosterone 100 mg injection to RUOQ well without complications. Patient advised to schedule next injection in one week.   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    [name], CMA

## 2020-08-17 ENCOUNTER — Other Ambulatory Visit: Payer: Self-pay

## 2020-08-17 ENCOUNTER — Ambulatory Visit (INDEPENDENT_AMBULATORY_CARE_PROVIDER_SITE_OTHER): Payer: BC Managed Care – PPO | Admitting: Medical-Surgical

## 2020-08-17 VITALS — BP 132/82 | HR 91 | Ht 66.0 in | Wt 195.0 lb

## 2020-08-17 DIAGNOSIS — F640 Transsexualism: Secondary | ICD-10-CM

## 2020-08-17 DIAGNOSIS — IMO0001 Reserved for inherently not codable concepts without codable children: Secondary | ICD-10-CM

## 2020-08-17 DIAGNOSIS — Z79899 Other long term (current) drug therapy: Secondary | ICD-10-CM

## 2020-08-17 MED ORDER — TESTOSTERONE CYPIONATE 200 MG/ML IM SOLN
100.0000 mg | INTRAMUSCULAR | Status: DC
  Administered 2020-08-17 – 2020-08-31 (×3): 100 mg via INTRAMUSCULAR

## 2020-08-17 NOTE — Progress Notes (Signed)
Victoria Hill presents for for testosterone injection. Denies chest pain, shortness of breath, headaches, and problems with medication or mood changes.   Patient tolerated testosterone 100 mg injection to LUOQ well without complications. Patient advised to schedule next injection in one week.

## 2020-08-24 ENCOUNTER — Other Ambulatory Visit: Payer: Self-pay

## 2020-08-24 ENCOUNTER — Ambulatory Visit (INDEPENDENT_AMBULATORY_CARE_PROVIDER_SITE_OTHER): Payer: BC Managed Care – PPO | Admitting: Osteopathic Medicine

## 2020-08-24 VITALS — BP 121/71 | HR 82

## 2020-08-24 DIAGNOSIS — F640 Transsexualism: Secondary | ICD-10-CM

## 2020-08-24 DIAGNOSIS — Z79899 Other long term (current) drug therapy: Secondary | ICD-10-CM

## 2020-08-24 DIAGNOSIS — IMO0001 Reserved for inherently not codable concepts without codable children: Secondary | ICD-10-CM

## 2020-08-24 NOTE — Progress Notes (Signed)
Established Patient Office Visit  Subjective:  Patient ID: Victoria Hill, adult    DOB: 1985-09-06  Age: 35 y.o. MRN: [id number]  CC:  Chief Complaint  Patient presents with   Injections    HPI [name] is here for a testosterone injection. Denies chest pain, shortness of breath, headaches or mood changes.    Past Medical History:  Diagnosis Date   Anxiety    Depression     Past Surgical History:  Procedure Laterality Date   CHEST SURGERY      History reviewed. No pertinent family history.  Social History   Socioeconomic History   Marital status: Single    Spouse name: Not on file   Number of children: Not on file   Years of education: Not on file   Highest education level: Not on file  Occupational History   Not on file  Tobacco Use   Smoking status: Never   Smokeless tobacco: Never  Vaping Use   Vaping Use: Never used  Substance and Sexual Activity   Alcohol use: Not Currently   Drug use: Never   Sexual activity: Never    Partners: Female  Other Topics Concern   Not on file  Social History Narrative   Not on file   Social Determinants of Health   Financial Resource Strain: Not on file  Food Insecurity: Not on file  Transportation Needs: Not on file  Physical Activity: Not on file  Stress: Not on file  Social Connections: Not on file  Intimate Partner Violence: Not on file    Outpatient Medications Prior to Visit  Medication Sig Dispense Refill   Ascorbic Acid (VITAMIN C PO) Take 100 mg by mouth.     buPROPion (WELLBUTRIN XL) 150 MG 24 hr tablet Take 1 tablet (150 mg total) by mouth every morning. 90 tablet 3   meloxicam (MOBIC) 15 MG tablet One tab PO qAM with a meal for 2 weeks, then daily prn pain. 30 tablet 3   Multiple Vitamins-Minerals (ZINC PO) Take 50 mg by mouth.     TESTOSTERONE CYPIONATE IM Inject 100 mg into the muscle every 7 (seven) days.     VITAMIN D PO Take 50 mg by mouth.     Facility-Administered Medications Prior to Visit   Medication Dose Route Frequency Provider Last Rate Last Admin   testosterone cypionate (DEPOTESTOSTERONE CYPIONATE) injection 100 mg  100 mg Intramuscular Q7 days Samuel Bouche, NP   100 mg at 08/24/20 1039    No Known Allergies  ROS Review of Systems    Objective:    Physical Exam  BP 121/71   Pulse 82   SpO2 100%  Wt Readings from Last 3 Encounters:  08/17/20 195 lb (88.5 kg)  08/10/20 196 lb (88.9 kg)  07/20/20 196 lb (88.9 kg)     Health Maintenance Due  Topic Date Due   Pneumococcal Vaccine 74-36 Years old (1 - PCV) Never done   HIV Screening  Never done   Hepatitis C Screening  Never done   COVID-19 Vaccine (3 - Booster for Janssen series) 01/22/2020    There are no preventive care reminders to display for this patient.  No results found for: TSH Lab Results  Component Value Date   WBC 6.8 06/15/2020   HGB 15.7 (H) 06/15/2020   HCT 46.9 (H) 06/15/2020   MCV 89.5 06/15/2020   PLT 343 06/15/2020   Lab Results  Component Value Date   NA 141 06/15/2020  K 3.8 06/15/2020   CO2 30 06/15/2020   GLUCOSE 87 06/15/2020   BUN 11 06/15/2020   CREATININE 0.99 06/15/2020   BILITOT 0.8 06/15/2020   AST 18 06/15/2020   ALT 30 (H) 06/15/2020   PROT 6.9 06/15/2020   CALCIUM 9.2 06/15/2020   Lab Results  Component Value Date   CHOL 167 06/15/2020   Lab Results  Component Value Date   HDL 35 (L) 06/15/2020   Lab Results  Component Value Date   LDLCALC 103 (H) 06/15/2020   Lab Results  Component Value Date   TRIG 169 (H) 06/15/2020   Lab Results  Component Value Date   CHOLHDL 4.8 06/15/2020   No results found for: HGBA1C    Assessment & Plan:   Testosterone injection-Patient tolerated injection well without complications. Patient advised to schedule next injection 7 days from today.   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   Follow-up:  Return in about 1 week (around 08/31/2020) for Testosterone injection .    Lavell Luster, [place]

## 2020-08-25 ENCOUNTER — Ambulatory Visit: Payer: BC Managed Care – PPO | Admitting: Family Medicine

## 2020-08-25 ENCOUNTER — Encounter: Payer: Self-pay | Admitting: Family Medicine

## 2020-08-25 DIAGNOSIS — R102 Pelvic and perineal pain: Secondary | ICD-10-CM | POA: Insufficient documentation

## 2020-08-25 NOTE — Assessment & Plan Note (Addendum)
Suspect on-going pain related to pelvic organs and gender affirming care. Should improve with surgical removal. After careful consideration, he has decided for hysterectomy with BSO. Will proceed with TVH, as simplest and minimally invasive. No prior abdominal surgeries. Uterus is small 6.4 x 4.9 x 3.5 cm.  Risks related to surgery include but are not limited to bleeding, infection, injury to surrounding structures, including bowel, bladder and ureters, blood clots, and death.  Likelihood of success is high. We also discussed the limited data around decreased life expectancy with oophorectomy in transgender males (as opposed to cis-gendered females). He would continue his testosterone and so the effects are unlikely to cause any untoward climacteric symptoms.

## 2020-08-25 NOTE — Progress Notes (Addendum)
   Subjective:    Patient ID: Victoria Hill is a 35 y.o. adult presenting with surgery consult  on 08/25/2020  HPI: Victoria Hill is here today to discuss on-going pelvic pain. He has cyclic pain, that is not relieved by anything. This pain has been present for a very long time. He states it is midline and radiates outward. It is dull but gets sharper and sometimes doubles him over. He has been seen previously and had pelvic sonogram which was unrevealing. He does not have cycles.   Review of Systems  Constitutional:  Negative for chills and fever.  Respiratory:  Negative for shortness of breath.   Cardiovascular:  Negative for chest pain.  Gastrointestinal:  Negative for abdominal pain, nausea and vomiting.  Genitourinary:  Positive for pelvic pain. Negative for dysuria.  Skin:  Negative for rash.     Objective:    BP (!) 144/87   Pulse (!) 102   Ht '5\' 5"'$  (1.651 m)   Wt 193 lb (87.5 kg)   BMI 32.12 kg/m  Physical Exam Constitutional:      General: He is not in acute distress.    Appearance: He is well-developed.  HENT:     Head: Normocephalic and atraumatic.  Eyes:     General: No scleral icterus. Cardiovascular:     Rate and Rhythm: Normal rate.  Pulmonary:     Effort: Pulmonary effort is normal.  Abdominal:     Palpations: Abdomen is soft.  Musculoskeletal:     Cervical back: Neck supple.  Skin:    General: Skin is warm and dry.  Neurological:     Mental Status: He is alert and oriented to person, place, and time.        Assessment & Plan:   Problem List Items Addressed This Visit       Unprioritized   Pelvic pain    Suspect on-going pain related to pelvic organs and gender affirming care. Should improve with surgical removal. After careful consideration, he has decided for hysterectomy with BSO. Will proceed with TVH, as simplest and minimally invasive. No prior abdominal surgeries. Uterus is small 6.4 x 4.9 x 3.5 cm.  Risks related to surgery include but are not  limited to bleeding, infection, injury to surrounding structures, including bowel, bladder and ureters, blood clots, and death.  Likelihood of success is high. We also discussed the limited data around decreased life expectancy with oophorectomy in transgender males (as opposed to cis-gendered females). He would continue his testosterone and so the effects are unlikely to cause any untoward climacteric symptoms.         Total time in review of prior notes, radiology, labs, history taking, review with patient, exam, note writing, discussion of options, risks of surgery, plan for surgery, recovery and plan for next steps, alternatives and risks of treatment: 41 minutes.  Return in about 3 months (around 11/25/2020) for postop check.  Victoria Hill 08/25/2020 1:25 PM

## 2020-08-26 ENCOUNTER — Telehealth: Payer: Self-pay | Admitting: *Deleted

## 2020-08-26 NOTE — Telephone Encounter (Signed)
Call to patient- voice mail confirms "Victoria Hill" and DPR states ok to leave message. Left message calling from doctors office regarding scheduling discussed yesterday. Call back to [phone]. No patient identifiers left on voice mail.

## 2020-08-26 NOTE — Telephone Encounter (Signed)
Call from patient. Discussed date option of late September pending insurance processes. Patient agreeable to date. Has contact info and will call back for updates or questions.

## 2020-08-31 ENCOUNTER — Ambulatory Visit (INDEPENDENT_AMBULATORY_CARE_PROVIDER_SITE_OTHER): Payer: BC Managed Care – PPO | Admitting: Osteopathic Medicine

## 2020-08-31 VITALS — BP 117/75 | HR 81

## 2020-08-31 DIAGNOSIS — F640 Transsexualism: Secondary | ICD-10-CM

## 2020-08-31 DIAGNOSIS — Z79899 Other long term (current) drug therapy: Secondary | ICD-10-CM

## 2020-08-31 DIAGNOSIS — IMO0001 Reserved for inherently not codable concepts without codable children: Secondary | ICD-10-CM

## 2020-08-31 MED ORDER — TESTOSTERONE CYPIONATE 200 MG/ML IM SOLN
100.0000 mg | INTRAMUSCULAR | Status: DC
  Administered 2020-09-07 – 2021-04-07 (×20): 100 mg via INTRAMUSCULAR

## 2020-08-31 NOTE — Progress Notes (Signed)
Established Patient Office Visit  Subjective:  Patient ID: Victoria Hill, adult    DOB: February 28, 1985  Age: 35 y.o. MRN: [id number]  CC:  Chief Complaint  Patient presents with   Injections    HPI Victoria Hill presents for testosterone injection. Denies shortness of breath,chest pain, headache , problems with medication or mood changes.  Past Medical History:  Diagnosis Date   Anxiety    Depression     Past Surgical History:  Procedure Laterality Date   CHEST SURGERY      History reviewed. No pertinent family history.  Social History   Socioeconomic History   Marital status: Single    Spouse name: Not on file   Number of children: Not on file   Years of education: Not on file   Highest education level: Not on file  Occupational History   Not on file  Tobacco Use   Smoking status: Never   Smokeless tobacco: Never  Vaping Use   Vaping Use: Never used  Substance and Sexual Activity   Alcohol use: Not Currently   Drug use: Never   Sexual activity: Never    Partners: Female  Other Topics Concern   Not on file  Social History Narrative   Not on file   Social Determinants of Health   Financial Resource Strain: Not on file  Food Insecurity: Not on file  Transportation Needs: Not on file  Physical Activity: Not on file  Stress: Not on file  Social Connections: Not on file  Intimate Partner Violence: Not on file    Outpatient Medications Prior to Visit  Medication Sig Dispense Refill   Ascorbic Acid (VITAMIN C PO) Take 100 mg by mouth.     buPROPion (WELLBUTRIN XL) 150 MG 24 hr tablet Take 1 tablet (150 mg total) by mouth every morning. 90 tablet 3   Multiple Vitamins-Minerals (ZINC PO) Take 50 mg by mouth.     TESTOSTERONE CYPIONATE IM Inject 100 mg into the muscle every 7 (seven) days.     VITAMIN D PO Take 50 mg by mouth.     Facility-Administered Medications Prior to Visit  Medication Dose Route Frequency Provider Last Rate Last Admin   testosterone  cypionate (DEPOTESTOSTERONE CYPIONATE) injection 100 mg  100 mg Intramuscular Q7 days Samuel Bouche, NP   100 mg at 08/24/20 1039    No Known Allergies  ROS Review of Systems    Objective:    Physical Exam  BP 117/75 (BP Location: Left Arm, Patient Position: Sitting, Cuff Size: Normal)   Pulse 81   SpO2 100%  Wt Readings from Last 3 Encounters:  08/25/20 193 lb (87.5 kg)  08/17/20 195 lb (88.5 kg)  08/10/20 196 lb (88.9 kg)     Health Maintenance Due  Topic Date Due   Pneumococcal Vaccine 39-84 Years old (1 - PCV) Never done   HIV Screening  Never done   Hepatitis C Screening  Never done   COVID-19 Vaccine (3 - Booster for Janssen series) 01/22/2020   INFLUENZA VACCINE  08/30/2020    There are no preventive care reminders to display for this patient.  No results found for: TSH Lab Results  Component Value Date   WBC 6.8 06/15/2020   HGB 15.7 (H) 06/15/2020   HCT 46.9 (H) 06/15/2020   MCV 89.5 06/15/2020   PLT 343 06/15/2020   Lab Results  Component Value Date   NA 141 06/15/2020   K 3.8 06/15/2020   CO2 30 06/15/2020  GLUCOSE 87 06/15/2020   BUN 11 06/15/2020   CREATININE 0.99 06/15/2020   BILITOT 0.8 06/15/2020   AST 18 06/15/2020   ALT 30 (H) 06/15/2020   PROT 6.9 06/15/2020   CALCIUM 9.2 06/15/2020   Lab Results  Component Value Date   CHOL 167 06/15/2020   Lab Results  Component Value Date   HDL 35 (L) 06/15/2020   Lab Results  Component Value Date   LDLCALC 103 (H) 06/15/2020   Lab Results  Component Value Date   TRIG 169 (H) 06/15/2020   Lab Results  Component Value Date   CHOLHDL 4.8 06/15/2020   No results found for: HGBA1C    Assessment & Plan:   Patient received injection in left upper outer quadrant. Patient tolerated injection well without complications. Patient will follow up in 1 week.  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    Gust Brooms, CMA

## 2020-09-07 ENCOUNTER — Ambulatory Visit (INDEPENDENT_AMBULATORY_CARE_PROVIDER_SITE_OTHER): Payer: BC Managed Care – PPO | Admitting: Family Medicine

## 2020-09-07 VITALS — BP 111/68 | HR 80

## 2020-09-07 DIAGNOSIS — Z79899 Other long term (current) drug therapy: Secondary | ICD-10-CM | POA: Diagnosis not present

## 2020-09-07 DIAGNOSIS — F640 Transsexualism: Secondary | ICD-10-CM | POA: Diagnosis not present

## 2020-09-07 DIAGNOSIS — IMO0001 Reserved for inherently not codable concepts without codable children: Secondary | ICD-10-CM

## 2020-09-07 NOTE — Progress Notes (Signed)
Established Patient Office Visit  Subjective:  Patient ID: Victoria Hill, adult    DOB: [birth year]  Age: 35 y.o. MRN: [id number]  CC:  Chief Complaint  Patient presents with   Injections    HPI Victoria Hill is here for a testosterone injection. Denies chest pain, shortness of breath, headaches or mood changes.    Past Medical History:  Diagnosis Date   Anxiety    Depression     Past Surgical History:  Procedure Laterality Date   CHEST SURGERY      History reviewed. No pertinent family history.  Social History   Socioeconomic History   Marital status: Single    Spouse name: Not on file   Number of children: Not on file   Years of education: Not on file   Highest education level: Not on file  Occupational History   Not on file  Tobacco Use   Smoking status: Never   Smokeless tobacco: Never  Vaping Use   Vaping Use: Never used  Substance and Sexual Activity   Alcohol use: Not Currently   Drug use: Never   Sexual activity: Never    Partners: Female  Other Topics Concern   Not on file  Social History Narrative   Not on file   Social Determinants of Health   Financial Resource Strain: Not on file  Food Insecurity: Not on file  Transportation Needs: Not on file  Physical Activity: Not on file  Stress: Not on file  Social Connections: Not on file  Intimate Partner Violence: Not on file    Outpatient Medications Prior to Visit  Medication Sig Dispense Refill   Ascorbic Acid (VITAMIN C PO) Take 100 mg by mouth.     buPROPion (WELLBUTRIN XL) 150 MG 24 hr tablet Take 1 tablet (150 mg total) by mouth every morning. 90 tablet 3   Multiple Vitamins-Minerals (ZINC PO) Take 50 mg by mouth.     TESTOSTERONE CYPIONATE IM Inject 100 mg into the muscle every 7 (seven) days.     VITAMIN D PO Take 50 mg by mouth.     Facility-Administered Medications Prior to Visit  Medication Dose Route Frequency Provider Last Rate Last Admin   testosterone cypionate (DEPOTESTOSTERONE  CYPIONATE) injection 100 mg  100 mg Intramuscular Q7 days Samuel Bouche, NP   100 mg at 08/31/20 1113   testosterone cypionate (DEPOTESTOSTERONE CYPIONATE) injection 100 mg  100 mg Intramuscular Weekly Emeterio Reeve, DO   100 mg at 09/07/20 1028    No Known Allergies  ROS Review of Systems    Objective:    Physical Exam  BP 111/68   Pulse 80   SpO2 100%  Wt Readings from Last 3 Encounters:  08/25/20 193 lb (87.5 kg)  08/17/20 195 lb (88.5 kg)  08/10/20 196 lb (88.9 kg)     Health Maintenance Due  Topic Date Due   Pneumococcal Vaccine 52-110 Years old (1 - PCV) Never done   HIV Screening  Never done   Hepatitis C Screening  Never done   COVID-19 Vaccine (3 - Booster for Janssen series) 01/22/2020   INFLUENZA VACCINE  08/30/2020    There are no preventive care reminders to display for this patient.  No results found for: TSH Lab Results  Component Value Date   WBC 6.8 06/15/2020   HGB 15.7 (H) 06/15/2020   HCT 46.9 (H) 06/15/2020   MCV 89.5 06/15/2020   PLT 343 06/15/2020   Lab Results  Component Value Date  NA 141 06/15/2020   K 3.8 06/15/2020   CO2 30 06/15/2020   GLUCOSE 87 06/15/2020   BUN 11 06/15/2020   CREATININE 0.99 06/15/2020   BILITOT 0.8 06/15/2020   AST 18 06/15/2020   ALT 30 (H) 06/15/2020   PROT 6.9 06/15/2020   CALCIUM 9.2 06/15/2020   Lab Results  Component Value Date   CHOL 167 06/15/2020   Lab Results  Component Value Date   HDL 35 (L) 06/15/2020   Lab Results  Component Value Date   LDLCALC 103 (H) 06/15/2020   Lab Results  Component Value Date   TRIG 169 (H) 06/15/2020   Lab Results  Component Value Date   CHOLHDL 4.8 06/15/2020   No results found for: HGBA1C    Assessment & Plan:  Testosterone injection - Patient tolerated injection well without complications. Patient advised to schedule next injection 7 days from today.     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   Follow-up: Return in about 1 week (around 09/14/2020) for testosterone injection. [name], [name], [place]

## 2020-09-10 NOTE — Progress Notes (Signed)
This needs to be signed by Dr. Loni Muse  or provider on site for DOS.

## 2020-09-14 ENCOUNTER — Other Ambulatory Visit: Payer: Self-pay

## 2020-09-14 ENCOUNTER — Ambulatory Visit (INDEPENDENT_AMBULATORY_CARE_PROVIDER_SITE_OTHER): Payer: BC Managed Care – PPO | Admitting: Osteopathic Medicine

## 2020-09-14 VITALS — BP 110/66 | HR 83

## 2020-09-14 DIAGNOSIS — F640 Transsexualism: Secondary | ICD-10-CM

## 2020-09-14 DIAGNOSIS — Z79899 Other long term (current) drug therapy: Secondary | ICD-10-CM

## 2020-09-14 DIAGNOSIS — IMO0001 Reserved for inherently not codable concepts without codable children: Secondary | ICD-10-CM

## 2020-09-14 NOTE — Progress Notes (Signed)
Established Patient Office Visit  Subjective:  Patient ID: Victoria Hill, adult    DOB: [birth year]  Age: 35 y.o. MRN: [id number]  CC:  Chief Complaint  Patient presents with   Injections    HPI Hailea Mcclarin is here for a testosterone injection. Denies chest pain, shortness of breath, headaches or mood changes.    Past Medical History:  Diagnosis Date   Anxiety    Depression     Past Surgical History:  Procedure Laterality Date   CHEST SURGERY      History reviewed. No pertinent family history.  Social History   Socioeconomic History   Marital status: Single    Spouse name: Not on file   Number of children: Not on file   Years of education: Not on file   Highest education level: Not on file  Occupational History   Not on file  Tobacco Use   Smoking status: Never   Smokeless tobacco: Never  Vaping Use   Vaping Use: Never used  Substance and Sexual Activity   Alcohol use: Not Currently   Drug use: Never   Sexual activity: Never    Partners: Female  Other Topics Concern   Not on file  Social History Narrative   Not on file   Social Determinants of Health   Financial Resource Strain: Not on file  Food Insecurity: Not on file  Transportation Needs: Not on file  Physical Activity: Not on file  Stress: Not on file  Social Connections: Not on file  Intimate Partner Violence: Not on file    Outpatient Medications Prior to Visit  Medication Sig Dispense Refill   Ascorbic Acid (VITAMIN C PO) Take 100 mg by mouth.     buPROPion (WELLBUTRIN XL) 150 MG 24 hr tablet Take 1 tablet (150 mg total) by mouth every morning. 90 tablet 3   Multiple Vitamins-Minerals (ZINC PO) Take 50 mg by mouth.     TESTOSTERONE CYPIONATE IM Inject 100 mg into the muscle every 7 (seven) days.     VITAMIN D PO Take 50 mg by mouth.     Facility-Administered Medications Prior to Visit  Medication Dose Route Frequency Provider Last Rate Last Admin   testosterone cypionate (DEPOTESTOSTERONE  CYPIONATE) injection 100 mg  100 mg Intramuscular Q7 days Samuel Bouche, NP   100 mg at 08/31/20 1113   testosterone cypionate (DEPOTESTOSTERONE CYPIONATE) injection 100 mg  100 mg Intramuscular Weekly Emeterio Reeve, DO   100 mg at 09/14/20 1052    No Known Allergies  ROS Review of Systems    Objective:    Physical Exam  BP 110/66   Pulse 83   SpO2 100%  Wt Readings from Last 3 Encounters:  08/25/20 193 lb (87.5 kg)  08/17/20 195 lb (88.5 kg)  08/10/20 196 lb (88.9 kg)     Health Maintenance Due  Topic Date Due   Pneumococcal Vaccine 73-37 Years old (1 - PCV) Never done   HIV Screening  Never done   Hepatitis C Screening  Never done   COVID-19 Vaccine (3 - Booster for Janssen series) 01/22/2020   INFLUENZA VACCINE  08/30/2020    There are no preventive care reminders to display for this patient.  No results found for: TSH Lab Results  Component Value Date   WBC 6.8 06/15/2020   HGB 15.7 (H) 06/15/2020   HCT 46.9 (H) 06/15/2020   MCV 89.5 06/15/2020   PLT 343 06/15/2020   Lab Results  Component Value Date  NA 141 06/15/2020   K 3.8 06/15/2020   CO2 30 06/15/2020   GLUCOSE 87 06/15/2020   BUN 11 06/15/2020   CREATININE 0.99 06/15/2020   BILITOT 0.8 06/15/2020   AST 18 06/15/2020   ALT 30 (H) 06/15/2020   PROT 6.9 06/15/2020   CALCIUM 9.2 06/15/2020   Lab Results  Component Value Date   CHOL 167 06/15/2020   Lab Results  Component Value Date   HDL 35 (L) 06/15/2020   Lab Results  Component Value Date   LDLCALC 103 (H) 06/15/2020   Lab Results  Component Value Date   TRIG 169 (H) 06/15/2020   Lab Results  Component Value Date   CHOLHDL 4.8 06/15/2020   No results found for: HGBA1C    Assessment & Plan:  Testosterone injection - Patient tolerated injection well without complications. Patient advised to schedule next injection 7 days from today.    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   Follow-up: Return in about 1 week (around 09/21/2020) for testosterone injection. [name], [name], [place]

## 2020-09-21 ENCOUNTER — Other Ambulatory Visit: Payer: Self-pay

## 2020-09-21 ENCOUNTER — Encounter: Payer: Self-pay | Admitting: *Deleted

## 2020-09-21 ENCOUNTER — Telehealth: Payer: Self-pay | Admitting: *Deleted

## 2020-09-21 ENCOUNTER — Ambulatory Visit (INDEPENDENT_AMBULATORY_CARE_PROVIDER_SITE_OTHER): Payer: BC Managed Care – PPO | Admitting: Osteopathic Medicine

## 2020-09-21 VITALS — BP 115/69 | HR 87

## 2020-09-21 DIAGNOSIS — Z79899 Other long term (current) drug therapy: Secondary | ICD-10-CM | POA: Diagnosis not present

## 2020-09-21 DIAGNOSIS — IMO0001 Reserved for inherently not codable concepts without codable children: Secondary | ICD-10-CM

## 2020-09-21 DIAGNOSIS — F640 Transsexualism: Secondary | ICD-10-CM | POA: Diagnosis not present

## 2020-09-21 NOTE — Progress Notes (Signed)
Established Patient Office Visit  Subjective:  Patient ID: Victoria Hill, adult    DOB: 10-Jun-1985  Age: 35 y.o. MRN: [id number]  CC:  Chief Complaint  Patient presents with   Injections    HPI Victoria Hill is here for a testosterone injection. Denies chest pain, shortness of breath, headaches or mood changes.    Past Medical History:  Diagnosis Date   Anxiety    Depression     Past Surgical History:  Procedure Laterality Date   CHEST SURGERY      History reviewed. No pertinent family history.  Social History   Socioeconomic History   Marital status: Single    Spouse name: Not on file   Number of children: Not on file   Years of education: Not on file   Highest education level: Not on file  Occupational History   Not on file  Tobacco Use   Smoking status: Never   Smokeless tobacco: Never  Vaping Use   Vaping Use: Never used  Substance and Sexual Activity   Alcohol use: Not Currently   Drug use: Never   Sexual activity: Never    Partners: Female  Other Topics Concern   Not on file  Social History Narrative   Not on file   Social Determinants of Health   Financial Resource Strain: Not on file  Food Insecurity: Not on file  Transportation Needs: Not on file  Physical Activity: Not on file  Stress: Not on file  Social Connections: Not on file  Intimate Partner Violence: Not on file    Outpatient Medications Prior to Visit  Medication Sig Dispense Refill   Ascorbic Acid (VITAMIN C PO) Take 100 mg by mouth.     buPROPion (WELLBUTRIN XL) 150 MG 24 hr tablet Take 1 tablet (150 mg total) by mouth every morning. 90 tablet 3   Multiple Vitamins-Minerals (ZINC PO) Take 50 mg by mouth.     TESTOSTERONE CYPIONATE IM Inject 100 mg into the muscle every 7 (seven) days.     VITAMIN D PO Take 50 mg by mouth.     Facility-Administered Medications Prior to Visit  Medication Dose Route Frequency Provider Last Rate Last Admin   testosterone cypionate (DEPOTESTOSTERONE  CYPIONATE) injection 100 mg  100 mg Intramuscular Q7 days Samuel Bouche, NP   100 mg at 08/31/20 1113   testosterone cypionate (DEPOTESTOSTERONE CYPIONATE) injection 100 mg  100 mg Intramuscular Weekly Emeterio Reeve, DO   100 mg at 09/21/20 1035    No Known Allergies  ROS Review of Systems    Objective:    Physical Exam  BP 115/69   Pulse 87   SpO2 100%  Wt Readings from Last 3 Encounters:  08/25/20 193 lb (87.5 kg)  08/17/20 195 lb (88.5 kg)  08/10/20 196 lb (88.9 kg)     Health Maintenance Due  Topic Date Due   Pneumococcal Vaccine 58-42 Years old (1 - PCV) Never done   HIV Screening  Never done   Hepatitis C Screening  Never done   COVID-19 Vaccine (3 - Booster for Janssen series) 01/22/2020   INFLUENZA VACCINE  08/30/2020    There are no preventive care reminders to display for this patient.  No results found for: TSH Lab Results  Component Value Date   WBC 6.8 06/15/2020   HGB 15.7 (H) 06/15/2020   HCT 46.9 (H) 06/15/2020   MCV 89.5 06/15/2020   PLT 343 06/15/2020   Lab Results  Component Value Date  NA 141 06/15/2020   K 3.8 06/15/2020   CO2 30 06/15/2020   GLUCOSE 87 06/15/2020   BUN 11 06/15/2020   CREATININE 0.99 06/15/2020   BILITOT 0.8 06/15/2020   AST 18 06/15/2020   ALT 30 (H) 06/15/2020   PROT 6.9 06/15/2020   CALCIUM 9.2 06/15/2020   Lab Results  Component Value Date   CHOL 167 06/15/2020   Lab Results  Component Value Date   HDL 35 (L) 06/15/2020   Lab Results  Component Value Date   LDLCALC 103 (H) 06/15/2020   Lab Results  Component Value Date   TRIG 169 (H) 06/15/2020   Lab Results  Component Value Date   CHOLHDL 4.8 06/15/2020   No results found for: HGBA1C    Assessment & Plan:  Testosterone injection - Patient tolerated injection well without complications. Patient advised to schedule next injection 7 days from today.     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   Follow-up: Return in about 1 week (around 09/28/2020) for testosterone injection. [name], [name], [place]

## 2020-09-21 NOTE — Telephone Encounter (Signed)
Call to patient approximately 11am. Discussed date option of 9-20 and 10-12. Patient agreeable with either.  Call back to patient. Voice mail has name confirmation "Victoria Hill." Left message scheduling is complete for 9-20 at 0820 and arrive 0530. Will receive letter in mail and visible on My Chart. Request call back to review instructions.

## 2020-09-22 NOTE — Telephone Encounter (Signed)
Call from patient. Confirmed surgery date of 10-19-20 at 47- arrive at [place] at [facility] Surgery instructions reviewed and advised will receive letter and pre-op phone call from surgery center.  Encounter closed.   Marland Kitchen

## 2020-09-28 ENCOUNTER — Other Ambulatory Visit: Payer: Self-pay

## 2020-09-28 ENCOUNTER — Ambulatory Visit: Payer: BC Managed Care – PPO

## 2020-09-28 ENCOUNTER — Encounter: Payer: Self-pay | Admitting: Osteopathic Medicine

## 2020-09-28 ENCOUNTER — Ambulatory Visit: Payer: BC Managed Care – PPO | Admitting: Osteopathic Medicine

## 2020-09-28 VITALS — BP 120/79 | HR 79 | Ht 65.0 in | Wt 195.0 lb

## 2020-09-28 DIAGNOSIS — F32A Depression, unspecified: Secondary | ICD-10-CM | POA: Diagnosis not present

## 2020-09-28 DIAGNOSIS — F411 Generalized anxiety disorder: Secondary | ICD-10-CM | POA: Diagnosis not present

## 2020-09-28 DIAGNOSIS — F419 Anxiety disorder, unspecified: Secondary | ICD-10-CM

## 2020-09-28 DIAGNOSIS — Z789 Other specified health status: Secondary | ICD-10-CM | POA: Diagnosis not present

## 2020-09-28 MED ORDER — BUPROPION HCL ER (XL) 150 MG PO TB24: 150.0000 mg | ORAL_TABLET | ORAL | 3 refills | Status: DC

## 2020-09-28 MED ORDER — TESTOSTERONE CYPIONATE 200 MG/ML IM SOLN
100.0000 mg | Freq: Once | INTRAMUSCULAR | Status: AC
  Administered 2020-09-28: 100 mg via INTRAMUSCULAR

## 2020-09-28 MED ORDER — ESCITALOPRAM OXALATE 10 MG PO TABS: 10.0000 mg | ORAL_TABLET | Freq: Every day | ORAL | 0 refills | Status: DC

## 2020-09-28 NOTE — Patient Instructions (Addendum)
Adding Lexapro today to help anxiety  Labs due around mid-11/2020   FYI to my patients: After six years here, I will be leaving practice at [facility]. My last day here will be 10/29/2020. I will continue to provide your care up until that date, and you will still be considered a patient here after that for as long as you want to be! You will get a letter in the mail explaining details, but after 10/29/2020, my patients have several options to continue care.  FOR MY TRANSGENDER/GNC PATIENTS ON HORMONES:  I HAVE A PLAN! Depending what you'd like to do... My colleagues at this practice will be able to continue your HRT as part of your general medical care, and I will always strive to make myself available to them for help or questions. My official replacement here has not been hired yet, but I am strongly encouraging our leadership to bring in someone who is competent in LGBTQ+ health care needs.  Alternatively, please contact [facility] in [place], [place] at [phone]: let them know you are a patient of Dr. Sheppard Coil, and you are seeking to establish care to maintain gender-affirming hormone treatment (may have a wait list). I plan to be at this clinic periodically but a schedule has NOT been confirmed yet and likely will not know before 01/2021. This clinic will also be able to address any other general family medicine / primary care needs.  Most [facility] clinics are able to manage HRT, though not general primary care for those with other medical conditions.  [facility] in [place], [place] at [phone] (may have a wait list).    It is bittersweet to leave! 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 I am advocating for an LGBTQ+ focused primary care office in the [place] area. I have truly  enjoyed taking care of folks here, but I am also excited for my next adventure doing something a bit different. Take care, and please let us know if you have any questions!   -Dr. Loni Muse.

## 2020-09-28 NOTE — Progress Notes (Signed)
Victoria Hill is a 35 y.o. adult who presents to  [place] at [facility]  today, 09/28/20, seeking care for the following:  T injection Anxiety worse past few months, doing overall ok w/ Wellbutrin. Previously on Prozac and Zoloft - no side effects but these Rx weren't helping symptoms.      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 Diagnoses of Generalized anxiety disorder and Anxiety and depression were also pertinent to this visit.   1. 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 Generalized anxiety disorder 3. Anxiety and depression Adding Lexapro    Patient Instructions  Adding Lexapro today to help anxiety  Labs due around mid-11/2020   FYI to my patients: After six years here, I will be leaving practice at [facility]. My last day here will be 10/29/2020. I will continue to provide your care up until that date, and you will still be considered a patient here after that for as long as you want to be! You will get a letter in the mail explaining details, but after 10/29/2020, my patients have several options to continue care.  FOR MY TRANSGENDER/GNC PATIENTS ON HORMONES:  I HAVE A PLAN! Depending what you'd like to do... My colleagues at this practice will be able to continue your HRT as part of your general medical care, and I will always strive to make myself available to them for help or questions. My official replacement here has not been hired yet, but I am strongly encouraging our leadership to bring in someone who is competent in LGBTQ+ health care needs.  Alternatively, please contact [facility] in [place], [place] at [phone]: let them know you are a patient of Dr. Sheppard Coil, and you are seeking to establish care to maintain gender-affirming hormone treatment (may have a wait list). I plan to be at this clinic periodically but a schedule has NOT been confirmed yet and likely will not know before 01/2021. This clinic will also be able to address any other general family medicine / primary care needs.  Most [facility] clinics are able to manage HRT, though not general primary care for those with other medical conditions.  [facility] in [place], [place] at [phone] (may have a wait list).    It is bittersweet to leave! 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 I am advocating for an LGBTQ+ focused primary care office in the [place] area. I have truly enjoyed taking care of folks here, but I am also excited for my next adventure doing something a bit different. Take care, and please let us know if you have any questions!   -Dr. Loni Muse.   No orders of the defined types were placed in this encounter.   Meds ordered this encounter  Medications   testosterone cypionate (DEPOTESTOSTERONE CYPIONATE) injection 100 mg   buPROPion (WELLBUTRIN XL) 150 MG 24 hr tablet    Sig: Take 1 tablet (150 mg total) by mouth every morning.    Dispense:  90 tablet    Refill:  3  escitalopram (LEXAPRO) 10 MG tablet    Sig: Take 1 tablet (10 mg total) by mouth daily.    Dispense:  90 tablet    Refill:  0     See below for relevant physical exam findings  See below for recent lab and imaging results reviewed  Medications, allergies, PMH, PSH, SocH, FamH reviewed below    Follow-up instructions: Return in about 24 days (around 10/22/2020) for RECHECK ON LEXAPRO - SEE ME SOONER IF NEEDED!  .                                        Exam:  BP 120/79   Pulse 79   Ht '5\' 5"'$  (1.651 m)   Wt 195 lb (88.5 kg)   BMI 32.45 kg/m  Constitutional: VS see above. General Appearance: alert, well-developed, well-nourished, NAD Neck: No masses, trachea midline.  Respiratory: Normal respiratory effort.  Musculoskeletal: Gait normal.  Neurological: Normal balance/coordination. No tremor. Skin: warm, dry, intact.  Psychiatric: Normal judgment/insight. Normal mood and affect. Oriented x3.   Current Meds  Medication Sig   escitalopram (LEXAPRO) 10 MG tablet Take 1 tablet (10 mg total) by mouth daily.   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  Social History   Tobacco Use  Smoking Status Never  Smokeless Tobacco Never    Past Surgical History:  Procedure Laterality Date   CHEST SURGERY      Immunization History  Administered Date(s) Administered   Janssen (J&J) SARS-COV-2 Vaccination 05/01/2019   PFIZER(Purple Top)SARS-COV-2 Vaccination 11/27/2019   Tdap 06/11/2019    Recent Results (from the past 2160 hour(s))  Urine Culture     Status: Abnormal   Collection Time: 07/07/20  9:29 AM   UC  Result Value Ref Range   Urine Culture, Routine Final report (A)    Organism ID, Bacteria Comment (A)     Comment: Streptococcus mitis/oralis group 25,000-50,000 colony forming units per mL Susceptibility not normally performed on this  organism.     No results found.     All questions at time of visit were answered - patient instructed to contact office with any additional concerns or updates. ER/RTC precautions were reviewed with the patient as applicable.   Please note: manual typing as well as voice recognition software may have been used to produce this document - typos may escape review. Please contact Dr. Sheppard Coil for any needed clarifications.

## 2020-10-03 NOTE — H&P (Signed)
Victoria Hill is an 35 y.o. G0P0000 adult.   Chief Complaint: Pelvic Pain HPI: Victoria Hill presents with chronic pelvic pain and desires permanent treatment. Pain is cyclic and is unrelieved by any medication. It is midline and radiates outward. Pelvic sonogram does not reveal any pathology.  Past Medical History:  Diagnosis Date   Anxiety    Depression     Past Surgical History:  Procedure Laterality Date   CHEST SURGERY      No family history on file. Social History:  reports that he has never smoked. He has never used smokeless tobacco. He reports that he does not currently use alcohol. He reports that he does not use drugs.  Allergies: No Known Allergies  No medications prior to admission.    A comprehensive review of systems was negative.  There were no vitals taken for this visit. General appearance: alert, cooperative, and appears stated age Head: Normocephalic, without obvious abnormality, atraumatic Neck: supple, symmetrical, trachea midline Lungs:  normal effort Heart: regular rate and rhythm Abdomen: soft, non-tender; bowel sounds normal; no masses,  no organomegaly Extremities: extremities normal, atraumatic, no cyanosis or edema Skin: Skin color, texture, turgor normal. No rashes or lesions Neurologic: Grossly normal   Lab Results  Component Value Date   WBC 6.8 06/15/2020   HGB 15.7 (H) 06/15/2020   HCT 46.9 (H) 06/15/2020   MCV 89.5 06/15/2020   PLT 343 06/15/2020     Assessment/Plan Principal Problem:   Pelvic pain  For TVH with BSO Risks include but are not limited to bleeding, infection, injury to surrounding structures, including bowel, bladder and ureters, blood clots, and death.  Likelihood of success is high.    Donnamae Jude 10/03/2020, 7:56 AM

## 2020-10-05 ENCOUNTER — Ambulatory Visit: Payer: BC Managed Care – PPO

## 2020-10-06 ENCOUNTER — Ambulatory Visit (INDEPENDENT_AMBULATORY_CARE_PROVIDER_SITE_OTHER): Payer: BC Managed Care – PPO | Admitting: Osteopathic Medicine

## 2020-10-06 VITALS — BP 138/79 | HR 63

## 2020-10-06 DIAGNOSIS — Z79899 Other long term (current) drug therapy: Secondary | ICD-10-CM | POA: Diagnosis not present

## 2020-10-06 DIAGNOSIS — F640 Transsexualism: Secondary | ICD-10-CM

## 2020-10-06 DIAGNOSIS — Z789 Other specified health status: Secondary | ICD-10-CM

## 2020-10-06 NOTE — Progress Notes (Signed)
Established Patient Office Visit  Subjective:  Patient ID: Victoria Hill, adult    DOB: Feb 16, 1985  Age: 35 y.o. MRN: [id number]  CC:  Chief Complaint  Patient presents with   Injections    HPI Victoria Hill is here for a testosterone injection. Denies chest pain, shortness of breath, headaches or mood changes.    Past Medical History:  Diagnosis Date   Anxiety    Depression     Past Surgical History:  Procedure Laterality Date   CHEST SURGERY      History reviewed. No pertinent family history.  Social History   Socioeconomic History   Marital status: Single    Spouse name: Not on file   Number of children: Not on file   Years of education: Not on file   Highest education level: Not on file  Occupational History   Not on file  Tobacco Use   Smoking status: Never   Smokeless tobacco: Never  Vaping Use   Vaping Use: Never used  Substance and Sexual Activity   Alcohol use: Not Currently   Drug use: Never   Sexual activity: Never    Partners: Female  Other Topics Concern   Not on file  Social History Narrative   Not on file   Social Determinants of Health   Financial Resource Strain: Not on file  Food Insecurity: Not on file  Transportation Needs: Not on file  Physical Activity: Not on file  Stress: Not on file  Social Connections: Not on file  Intimate Partner Violence: Not on file    Outpatient Medications Prior to Visit  Medication Sig Dispense Refill   Ascorbic Acid (VITAMIN C PO) Take 100 mg by mouth.     buPROPion (WELLBUTRIN XL) 150 MG 24 hr tablet Take 1 tablet (150 mg total) by mouth every morning. 90 tablet 3   escitalopram (LEXAPRO) 10 MG tablet Take 1 tablet (10 mg total) by mouth daily. 90 tablet 0   Multiple Vitamins-Minerals (ZINC PO) Take 50 mg by mouth.     TESTOSTERONE CYPIONATE IM Inject 100 mg into the muscle every 7 (seven) days.     VITAMIN D PO Take 50 mg by mouth.     Facility-Administered Medications Prior to Visit  Medication  Dose Route Frequency Provider Last Rate Last Admin   testosterone cypionate (DEPOTESTOSTERONE CYPIONATE) injection 100 mg  100 mg Intramuscular Weekly Emeterio Reeve, DO   100 mg at 10/06/20 1036   testosterone cypionate (DEPOTESTOSTERONE CYPIONATE) injection 100 mg  100 mg Intramuscular Q7 days Samuel Bouche, NP   100 mg at 08/31/20 1113    No Known Allergies  ROS Review of Systems    Objective:    Physical Exam  BP 138/79   Pulse 63   SpO2 100%  Wt Readings from Last 3 Encounters:  09/28/20 195 lb (88.5 kg)  08/25/20 193 lb (87.5 kg)  08/17/20 195 lb (88.5 kg)     Health Maintenance Due  Topic Date Due   Pneumococcal Vaccine 9-45 Years old (1 - PCV) Never done   HIV Screening  Never done   Hepatitis C Screening  Never done   COVID-19 Vaccine (3 - Booster for Janssen series) 01/22/2020    There are no preventive care reminders to display for this patient.  No results found for: TSH Lab Results  Component Value Date   WBC 6.8 06/15/2020   HGB 15.7 (H) 06/15/2020   HCT 46.9 (H) 06/15/2020   MCV 89.5 06/15/2020  PLT 343 06/15/2020   Lab Results  Component Value Date   NA 141 06/15/2020   K 3.8 06/15/2020   CO2 30 06/15/2020   GLUCOSE 87 06/15/2020   BUN 11 06/15/2020   CREATININE 0.99 06/15/2020   BILITOT 0.8 06/15/2020   AST 18 06/15/2020   ALT 30 (H) 06/15/2020   PROT 6.9 06/15/2020   CALCIUM 9.2 06/15/2020   Lab Results  Component Value Date   CHOL 167 06/15/2020   Lab Results  Component Value Date   HDL 35 (L) 06/15/2020   Lab Results  Component Value Date   LDLCALC 103 (H) 06/15/2020   Lab Results  Component Value Date   TRIG 169 (H) 06/15/2020   Lab Results  Component Value Date   CHOLHDL 4.8 06/15/2020   No results found for: HGBA1C    Assessment & Plan:  Testosterone injection - Patient tolerated injection well without complications. Patient advised to schedule next injection 7 days from today.    Problem List Items  Addressed This Visit   None Visit Diagnoses     Transgender man - AFAB/FTM on testosterone, planning for hyst/BSO 09/2020    -  Primary       No orders of the defined types were placed in this encounter.   Follow-up: Return in about 1 week (around 10/13/2020) for testosterone injection. [name], [name], [place]

## 2020-10-08 ENCOUNTER — Encounter: Payer: Self-pay | Admitting: Radiology

## 2020-10-12 ENCOUNTER — Other Ambulatory Visit: Payer: Self-pay

## 2020-10-12 ENCOUNTER — Ambulatory Visit (INDEPENDENT_AMBULATORY_CARE_PROVIDER_SITE_OTHER): Payer: BC Managed Care – PPO | Admitting: Osteopathic Medicine

## 2020-10-12 ENCOUNTER — Encounter (HOSPITAL_BASED_OUTPATIENT_CLINIC_OR_DEPARTMENT_OTHER): Payer: Self-pay | Admitting: Family Medicine

## 2020-10-12 VITALS — BP 110/64 | HR 74 | Wt 194.0 lb

## 2020-10-12 DIAGNOSIS — Z789 Other specified health status: Secondary | ICD-10-CM | POA: Diagnosis not present

## 2020-10-12 DIAGNOSIS — Z973 Presence of spectacles and contact lenses: Secondary | ICD-10-CM

## 2020-10-12 DIAGNOSIS — Z01812 Encounter for preprocedural laboratory examination: Secondary | ICD-10-CM | POA: Diagnosis not present

## 2020-10-12 HISTORY — DX: Presence of spectacles and contact lenses: Z97.3

## 2020-10-12 MED ORDER — TESTOSTERONE CYPIONATE 200 MG/ML IM SOLN
100.0000 mg | Freq: Once | INTRAMUSCULAR | Status: AC
  Administered 2020-10-12: 100 mg via INTRAMUSCULAR

## 2020-10-12 NOTE — Progress Notes (Signed)
Spoke w/ via phone for pre-op interview---pt Lab needs dos----    urine poct           Lab results------lab appt 10-15-2020 at 930 am for cbc with dif t & s COVID test ----10-15-2020 overnight stay Arrive at -------530 am 10-19-2020 NPO after MN NO Solid Food.  Clear liquids from MN until---430 am Med rec completed Medications to take morning of surgery -----wellbutrin, lexapro Diabetic medication -----n/a Patient instructed no nail polish to be worn day of surgery Patient instructed to bring photo id and insurance card day of surgery Patient aware to have Driver (ride ) / caregiver   justin queen friend  for 24 hours after surgery  Patient Special Instructions -----pt given overnight stay instructions Pre-Op special Istructions -----none Patient verbalized understanding of instructions that were given at this phone interview. Patient denies shortness of breath, chest pain, fever, cough at this phone interview.

## 2020-10-12 NOTE — Progress Notes (Signed)
T shot today see nurse notes

## 2020-10-12 NOTE — Progress Notes (Signed)
YOU ARE SCHEDULED FOR A COVID TEST ON  10-15-2020  . THIS TEST MUST BE DONE BEFORE SURGERY. GO TO  [facility] @ [place]   PHONE NUMBER [phone].   TURN LEFT AT THE SHIPPING AND RECEIVING SIGN AND LOOK FOR SMALL BLUE POP UP TENT UNDER BUILDING OVERHANG AT BACK OF BUILDING AND REMAIN IN YOUR CAR, THIS IS A DRIVE UP TEST.  AFTER YOUR COVID TEST , PLEASE WEAR A MASK OUT IN PUBLIC AND SOCIAL DISTANCE AND [place] YOUR HANDS FREQUENTLY. PLEASE ASK ALL YOUR CLOSE HOUSEHOLD CONTACT TO WEAR MASK OUT IN PUBLIC AND SOCIAL DISTANCE AND [place] HANDS FREQUENTLY ALSO.      Your procedure is scheduled on 10-19-2020  Report to [facility].   Call this number if you have problems the morning of surgery  :[phone].   OUR ADDRESS IS [address].  WE ARE LOCATED IN [facility].  PLEASE BRING YOUR INSURANCE CARD AND PHOTO ID DAY OF SURGERY.  ONLY ONE PERSON ALLOWED IN FACILITY WAITING AREA.                                     REMEMBER:  DO NOT EAT FOOD, CANDY GUM OR MINTS  AFTER MIDNIGHT . YOU MAY HAVE CLEAR LIQUIDS FROM MIDNIGHT UNTIL 430 AM. NO CLEAR LIQUIDS AFTER 430 AM DAY OF SURGERY.   YOU MAY  BRUSH YOUR TEETH MORNING OF SURGERY AND RINSE YOUR MOUTH OUT, NO CHEWING GUM CANDY OR MINTS.    CLEAR LIQUID DIET   Foods Allowed                                                                     Foods Excluded  Coffee and tea, regular and decaf                             liquids that you cannot  Plain Jell-O any favor except red or purple                                           see through such as: Fruit ices (not with fruit pulp)                                     milk, soups, orange juice  Iced Popsicles                                    All solid food Carbonated beverages, regular and diet                                    Cranberry, grape and apple juices Sports drinks like Gatorade Lightly  seasoned clear broth or  consume(fat free) Sugar  Sample Menu Breakfast                                Lunch                                     Supper Cranberry juice                    Beef broth                            Chicken broth Jell-O                                     Grape juice                           Apple juice Coffee or tea                        Jell-O                                      Popsicle                                                Coffee or tea                        Coffee or tea  _____________________________________________________________________     TAKE THESE MEDICATIONS MORNING OF SURGERY WITH A SIP OF WATER:WELLBUTRIN, LEXAPRO  ONE VISITOR IS ALLOWED IN WAITING ROOM ONLY DAY OF SURGERY.  NO VISITOR MAY SPEND THE NIGHT.  VISITOR ARE ALLOWED TO STAY UNTIL 800 PM.                                    DO NOT WEAR JEWERLY, MAKE UP. DO NOT WEAR LOTIONS, POWDERS, PERFUMES OR NAIL POLISH. DO NOT SHAVE FOR 48 HOURS PRIOR TO DAY OF SURGERY. MEN MAY SHAVE FACE AND NECK. CONTACTS, GLASSES, OR DENTURES MAY NOT BE WORN TO SURGERY.                                    [facility] IS NOT RESPONSIBLE  FOR ANY BELONGINGS.                                                                    Marland Kitchen           [facility] - Preparing for Surgery Before surgery, you can play an important role.  Because skin is not  sterile, your skin needs to be as free of germs as possible.  You can reduce the number of germs on your skin by washing with CHG (chlorahexidine gluconate) soap before surgery.  CHG is an antiseptic cleaner which kills germs and bonds with the skin to continue killing germs even after washing. Please DO NOT use if you have an allergy to CHG or antibacterial soaps.  If your skin becomes reddened/irritated stop using the CHG and inform your nurse when you arrive at Short Stay. Do not shave (including legs and underarms) for at least 48 hours prior to the first CHG shower.   You may shave your face/neck. Please follow these instructions carefully:  1.  Shower with CHG Soap the night before surgery and the  morning of Surgery.  2.  If you choose to wash your hair, wash your hair first as usual with your  normal  shampoo.  3.  After you shampoo, rinse your hair and body thoroughly to remove the  shampoo.                            4.  Use CHG as you would any other liquid soap.  You can apply chg directly  to the skin and wash                      Gently with a scrungie or clean washcloth.  5.  Apply the CHG Soap to your body ONLY FROM THE NECK DOWN.   Do not use on face/ open                           Wound or open sores. Avoid contact with eyes, ears mouth and genitals (private parts).                       Wash face,  Genitals (private parts) with your normal soap.             6.  Wash thoroughly, paying special attention to the area where your surgery  will be performed.  7.  Thoroughly rinse your body with warm water from the neck down.  8.  DO NOT shower/wash with your normal soap after using and rinsing off  the CHG Soap.                9.  Pat yourself dry with a clean towel.            10.  Wear clean pajamas.            11.  Place clean sheets on your bed the night of your first shower and do not  sleep with pets. Day of Surgery : Do not apply any lotions/deodorants the morning of surgery.  Please wear clean clothes to the hospital/surgery center.  FAILURE TO FOLLOW THESE INSTRUCTIONS MAY RESULT IN THE CANCELLATION OF YOUR SURGERY PATIENT SIGNATURE_________________________________  NURSE SIGNATURE__________________________________  ________________________________________________________________________                                                        QUESTIONS Hansel Feinstein PRE OP NURSE PHONE [phone].

## 2020-10-15 ENCOUNTER — Other Ambulatory Visit: Payer: Self-pay | Admitting: Family Medicine

## 2020-10-15 ENCOUNTER — Encounter (HOSPITAL_COMMUNITY)
Admission: RE | Admit: 2020-10-15 | Discharge: 2020-10-15 | Disposition: A | Discharge status: Home / Self Care | Payer: BC Managed Care – PPO | Source: Ambulatory Visit | Attending: Family Medicine | Admitting: Family Medicine

## 2020-10-15 ENCOUNTER — Other Ambulatory Visit: Payer: Self-pay

## 2020-10-15 DIAGNOSIS — Z01812 Encounter for preprocedural laboratory examination: Secondary | ICD-10-CM | POA: Diagnosis not present

## 2020-10-15 LAB — CBC WITH DIFFERENTIAL/PLATELET
Abs Immature Granulocytes: 0.02 10*3/uL (ref 0.00–0.07)
Basophils Absolute: 0 10*3/uL (ref 0.0–0.1)
Basophils Relative: 1 %
Eosinophils Absolute: 0.1 10*3/uL (ref 0.0–0.5)
Eosinophils Relative: 1 %
HCT: 46.4 % — ABNORMAL HIGH (ref 36.0–46.0)
Hemoglobin: 15.5 g/dL — ABNORMAL HIGH (ref 12.0–15.0)
Immature Granulocytes: 0 %
Lymphocytes Relative: 32 %
Lymphs Abs: 2.2 10*3/uL (ref 0.7–4.0)
MCH: 30.2 pg (ref 26.0–34.0)
MCHC: 33.4 g/dL (ref 30.0–36.0)
MCV: 90.3 fL (ref 80.0–100.0)
Monocytes Absolute: 0.5 10*3/uL (ref 0.1–1.0)
Monocytes Relative: 8 %
Neutro Abs: 3.9 10*3/uL (ref 1.7–7.7)
Neutrophils Relative %: 58 %
Platelets: 329 10*3/uL (ref 150–400)
RBC: 5.14 MIL/uL — ABNORMAL HIGH (ref 3.87–5.11)
RDW: 13.4 % (ref 11.5–15.5)
WBC: 6.7 10*3/uL (ref 4.0–10.5)
nRBC: 0 % (ref 0.0–0.2)

## 2020-10-16 LAB — SARS CORONAVIRUS 2 (TAT 6-24 HRS): SARS Coronavirus 2: NEGATIVE

## 2020-10-18 NOTE — Anesthesia Preprocedure Evaluation (Addendum)
Anesthesia Evaluation  Patient identified by MRN, date of birth, ID band Patient awake    Reviewed: Allergy & Precautions, NPO status , Patient's Chart, lab work & pertinent test results  History of Anesthesia Complications Negative for: history of anesthetic complications  Airway Mallampati: I  TM Distance: >3 FB Neck ROM: Full    Dental  (+) Poor Dentition, Chipped, Missing, Dental Advisory Given   Pulmonary neg pulmonary ROS,  10/15/2020 SARS coronavirus NEG   breath sounds clear to auscultation       Cardiovascular negative cardio ROS   Rhythm:Regular Rate:Normal     Neuro/Psych Anxiety Depression negative neurological ROS     GI/Hepatic negative GI ROS, Neg liver ROS,   Endo/Other  negative endocrine ROSobese  Renal/GU negative Renal ROS     Musculoskeletal   Abdominal (+) + obese,   Peds  Hematology negative hematology ROS (+)   Anesthesia Other Findings   Reproductive/Obstetrics                            Anesthesia Physical Anesthesia Plan  ASA: 2  Anesthesia Plan: General   Post-op Pain Management:    Induction: Intravenous  PONV Risk Score and Plan: 3 and Ondansetron, Dexamethasone and Scopolamine patch - Pre-op  Airway Management Planned: Oral ETT  Additional Equipment: None  Intra-op Plan:   Post-operative Plan: Extubation in OR  Informed Consent: I have reviewed the patients History and Physical, chart, labs and discussed the procedure including the risks, benefits and alternatives for the proposed anesthesia with the patient or authorized representative who has indicated his/her understanding and acceptance.     Dental advisory given  Plan Discussed with: CRNA and Surgeon  Anesthesia Plan Comments:        Anesthesia Quick Evaluation

## 2020-10-19 ENCOUNTER — Ambulatory Visit (HOSPITAL_BASED_OUTPATIENT_CLINIC_OR_DEPARTMENT_OTHER): Payer: BC Managed Care – PPO | Admitting: Certified Registered Nurse Anesthetist

## 2020-10-19 ENCOUNTER — Other Ambulatory Visit: Payer: Self-pay

## 2020-10-19 ENCOUNTER — Ambulatory Visit (HOSPITAL_BASED_OUTPATIENT_CLINIC_OR_DEPARTMENT_OTHER): Payer: BC Managed Care – PPO | Admitting: Anesthesiology

## 2020-10-19 ENCOUNTER — Encounter (HOSPITAL_BASED_OUTPATIENT_CLINIC_OR_DEPARTMENT_OTHER)
Admission: RE | Disposition: A | Discharge status: Home / Self Care | Payer: Self-pay | Source: Home / Self Care | Attending: Family Medicine

## 2020-10-19 ENCOUNTER — Ambulatory Visit (HOSPITAL_COMMUNITY)
Admission: RE | Admit: 2020-10-19 | Discharge: 2020-10-20 | Disposition: A | Discharge status: Home / Self Care | Payer: BC Managed Care – PPO | Attending: Family Medicine | Admitting: Family Medicine

## 2020-10-19 ENCOUNTER — Ambulatory Visit (HOSPITAL_BASED_OUTPATIENT_CLINIC_OR_DEPARTMENT_OTHER): Admit: 2020-10-19 | Payer: BC Managed Care – PPO | Admitting: Family Medicine

## 2020-10-19 ENCOUNTER — Encounter (HOSPITAL_BASED_OUTPATIENT_CLINIC_OR_DEPARTMENT_OTHER): Payer: Self-pay | Admitting: Family Medicine

## 2020-10-19 ENCOUNTER — Ambulatory Visit: Payer: BC Managed Care – PPO

## 2020-10-19 DIAGNOSIS — R102 Pelvic and perineal pain: Secondary | ICD-10-CM | POA: Diagnosis present

## 2020-10-19 DIAGNOSIS — Z8616 Personal history of COVID-19: Secondary | ICD-10-CM | POA: Insufficient documentation

## 2020-10-19 DIAGNOSIS — M25571 Pain in right ankle and joints of right foot: Secondary | ICD-10-CM | POA: Diagnosis not present

## 2020-10-19 DIAGNOSIS — Z90710 Acquired absence of both cervix and uterus: Secondary | ICD-10-CM | POA: Diagnosis present

## 2020-10-19 DIAGNOSIS — N83202 Unspecified ovarian cyst, left side: Secondary | ICD-10-CM | POA: Diagnosis not present

## 2020-10-19 DIAGNOSIS — F418 Other specified anxiety disorders: Secondary | ICD-10-CM | POA: Diagnosis not present

## 2020-10-19 DIAGNOSIS — D251 Intramural leiomyoma of uterus: Secondary | ICD-10-CM | POA: Insufficient documentation

## 2020-10-19 DIAGNOSIS — N83201 Unspecified ovarian cyst, right side: Secondary | ICD-10-CM | POA: Diagnosis not present

## 2020-10-19 DIAGNOSIS — N99820 Postprocedural hemorrhage and hematoma of a genitourinary system organ or structure following a genitourinary system procedure: Secondary | ICD-10-CM | POA: Diagnosis not present

## 2020-10-19 DIAGNOSIS — D219 Benign neoplasm of connective and other soft tissue, unspecified: Secondary | ICD-10-CM

## 2020-10-19 DIAGNOSIS — D252 Subserosal leiomyoma of uterus: Secondary | ICD-10-CM | POA: Diagnosis not present

## 2020-10-19 DIAGNOSIS — F640 Transsexualism: Secondary | ICD-10-CM | POA: Diagnosis not present

## 2020-10-19 DIAGNOSIS — M5431 Sciatica, right side: Secondary | ICD-10-CM | POA: Diagnosis not present

## 2020-10-19 HISTORY — PX: VAGINAL HYSTERECTOMY: SHX2639

## 2020-10-19 HISTORY — DX: Acquired absence of both cervix and uterus: Z90.710

## 2020-10-19 LAB — CBC
HCT: 42.2 % (ref 36.0–46.0)
Hemoglobin: 14.3 g/dL (ref 12.0–15.0)
MCH: 30.4 pg (ref 26.0–34.0)
MCHC: 33.9 g/dL (ref 30.0–36.0)
MCV: 89.8 fL (ref 80.0–100.0)
Platelets: 306 10*3/uL (ref 150–400)
RBC: 4.7 MIL/uL (ref 3.87–5.11)
RDW: 13.5 % (ref 11.5–15.5)
WBC: 15.5 10*3/uL — ABNORMAL HIGH (ref 4.0–10.5)
nRBC: 0 % (ref 0.0–0.2)

## 2020-10-19 LAB — POCT I-STAT, CHEM 8
BUN: 10 mg/dL (ref 6–20)
Calcium, Ion: 1.06 mmol/L — ABNORMAL LOW (ref 1.15–1.40)
Chloride: 100 mmol/L (ref 98–111)
Creatinine, Ser: 0.9 mg/dL (ref 0.44–1.00)
Glucose, Bld: 168 mg/dL — ABNORMAL HIGH (ref 70–99)
HCT: 43 % (ref 36.0–46.0)
Hemoglobin: 14.6 g/dL (ref 12.0–15.0)
Potassium: 3.3 mmol/L — ABNORMAL LOW (ref 3.5–5.1)
Sodium: 140 mmol/L (ref 135–145)
TCO2: 27 mmol/L (ref 22–32)

## 2020-10-19 LAB — CREATININE, SERUM
Creatinine, Ser: 0.98 mg/dL (ref 0.44–1.00)
GFR, Estimated: >60 mL/min (ref >60)

## 2020-10-19 LAB — TYPE AND SCREEN
ABO/RH(D): A POS
Antibody Screen: NEGATIVE

## 2020-10-19 LAB — ABO/RH: ABO/RH(D): A POS

## 2020-10-19 LAB — POCT PREGNANCY, URINE: Preg Test, Ur: NEGATIVE

## 2020-10-19 SURGERY — HYSTERECTOMY, VAGINAL
Anesthesia: General | Laterality: Bilateral

## 2020-10-19 SURGERY — EXAM UNDER ANESTHESIA
Anesthesia: General

## 2020-10-19 MED ORDER — EPHEDRINE SULFATE-NACL 50-0.9 MG/10ML-% IV SOSY
PREFILLED_SYRINGE | INTRAVENOUS | Status: DC | PRN
  Administered 2020-10-19 (×3): 5 mg via INTRAVENOUS

## 2020-10-19 MED ORDER — ONDANSETRON HCL 4 MG/2ML IJ SOLN: 4.0000 mg | Freq: Four times a day (QID) | INTRAMUSCULAR | Status: DC | PRN

## 2020-10-19 MED ORDER — ALBUMIN HUMAN 5 % IV SOLN
INTRAVENOUS | Status: AC
  Filled 2020-10-19: qty 250

## 2020-10-19 MED ORDER — OXYCODONE HCL 5 MG PO TABS
ORAL_TABLET | ORAL | Status: AC
  Filled 2020-10-19: qty 1

## 2020-10-19 MED ORDER — PHENYLEPHRINE 40 MCG/ML (10ML) SYRINGE FOR IV PUSH (FOR BLOOD PRESSURE SUPPORT)
PREFILLED_SYRINGE | INTRAVENOUS | Status: DC | PRN
  Administered 2020-10-19 (×5): 80 ug via INTRAVENOUS
  Administered 2020-10-19: 120 ug via INTRAVENOUS
  Administered 2020-10-19: 40 ug via INTRAVENOUS
  Administered 2020-10-19: 80 ug via INTRAVENOUS

## 2020-10-19 MED ORDER — MIDAZOLAM HCL 5 MG/5ML IJ SOLN
INTRAMUSCULAR | Status: DC | PRN
  Administered 2020-10-19: 2 mg via INTRAVENOUS

## 2020-10-19 MED ORDER — OXYCODONE HCL 5 MG PO TABS: 5.0000 mg | ORAL_TABLET | Freq: Once | ORAL | Status: DC | PRN

## 2020-10-19 MED ORDER — OXYCODONE HCL 5 MG/5ML PO SOLN
5.0000 mg | Freq: Once | ORAL | Status: DC | PRN
Start: 2020-10-19 — End: 2020-10-20

## 2020-10-19 MED ORDER — LACTATED RINGERS IV SOLN: INTRAVENOUS | Status: DC

## 2020-10-19 MED ORDER — ACETAMINOPHEN 500 MG PO TABS
ORAL_TABLET | ORAL | Status: AC
  Filled 2020-10-19: qty 2

## 2020-10-19 MED ORDER — OXYCODONE HCL 5 MG PO TABS
5.0000 mg | ORAL_TABLET | ORAL | Status: DC | PRN
  Administered 2020-10-19: 2.5 mg via ORAL
  Administered 2020-10-19 – 2020-10-20 (×4): 5 mg via ORAL

## 2020-10-19 MED ORDER — PROMETHAZINE HCL 25 MG/ML IJ SOLN: 6.2500 mg | INTRAMUSCULAR | Status: DC | PRN

## 2020-10-19 MED ORDER — PROPOFOL 10 MG/ML IV BOLUS
INTRAVENOUS | Status: DC | PRN
  Administered 2020-10-19: 200 mg via INTRAVENOUS
  Administered 2020-10-19: 50 mg via INTRAVENOUS

## 2020-10-19 MED ORDER — POVIDONE-IODINE 10 % EX SWAB: 2.0000 "application " | Freq: Once | CUTANEOUS | Status: DC

## 2020-10-19 MED ORDER — OXYCODONE-ACETAMINOPHEN 5-325 MG PO TABS: 1.0000 | ORAL_TABLET | Freq: Four times a day (QID) | ORAL | 0 refills | Status: DC | PRN

## 2020-10-19 MED ORDER — FENTANYL CITRATE (PF) 100 MCG/2ML IJ SOLN
INTRAMUSCULAR | Status: AC
  Filled 2020-10-19: qty 2

## 2020-10-19 MED ORDER — GABAPENTIN 100 MG PO CAPS
ORAL_CAPSULE | ORAL | Status: AC
  Filled 2020-10-19: qty 1

## 2020-10-19 MED ORDER — MIDAZOLAM HCL 2 MG/2ML IJ SOLN
INTRAMUSCULAR | Status: AC
  Filled 2020-10-19: qty 2

## 2020-10-19 MED ORDER — SCOPOLAMINE 1 MG/3DAYS TD PT72
MEDICATED_PATCH | TRANSDERMAL | Status: AC
  Filled 2020-10-19: qty 1

## 2020-10-19 MED ORDER — GABAPENTIN 100 MG PO CAPS
100.0000 mg | ORAL_CAPSULE | Freq: Three times a day (TID) | ORAL | Status: DC
  Administered 2020-10-19 (×2): 100 mg via ORAL

## 2020-10-19 MED ORDER — DEXAMETHASONE SODIUM PHOSPHATE 10 MG/ML IJ SOLN
INTRAMUSCULAR | Status: AC
  Filled 2020-10-19: qty 1

## 2020-10-19 MED ORDER — CEFAZOLIN SODIUM-DEXTROSE 2-4 GM/100ML-% IV SOLN
INTRAVENOUS | Status: AC
  Filled 2020-10-19: qty 100

## 2020-10-19 MED ORDER — LIDOCAINE HCL (PF) 2 % IJ SOLN
INTRAMUSCULAR | Status: AC
  Filled 2020-10-19: qty 5

## 2020-10-19 MED ORDER — MIDAZOLAM HCL 2 MG/2ML IJ SOLN
0.5000 mg | Freq: Once | INTRAMUSCULAR | Status: DC | PRN
Start: 2020-10-19 — End: 2020-10-20

## 2020-10-19 MED ORDER — KETOROLAC TROMETHAMINE 30 MG/ML IJ SOLN
INTRAMUSCULAR | Status: AC
  Filled 2020-10-19: qty 1

## 2020-10-19 MED ORDER — LIDOCAINE-EPINEPHRINE 1 %-1:100000 IJ SOLN
INTRAMUSCULAR | Status: DC | PRN
  Administered 2020-10-19: 20 mL

## 2020-10-19 MED ORDER — PHENYLEPHRINE 40 MCG/ML (10ML) SYRINGE FOR IV PUSH (FOR BLOOD PRESSURE SUPPORT)
PREFILLED_SYRINGE | INTRAVENOUS | Status: AC
  Filled 2020-10-19: qty 10

## 2020-10-19 MED ORDER — ROCURONIUM BROMIDE 100 MG/10ML IV SOLN
INTRAVENOUS | Status: DC | PRN
  Administered 2020-10-19 (×2): 10 mg via INTRAVENOUS
  Administered 2020-10-19: 60 mg via INTRAVENOUS
  Administered 2020-10-19 (×2): 10 mg via INTRAVENOUS

## 2020-10-19 MED ORDER — ACETAMINOPHEN 500 MG PO TABS: 1000.0000 mg | ORAL_TABLET | Freq: Once | ORAL | Status: DC

## 2020-10-19 MED ORDER — SCOPOLAMINE 1 MG/3DAYS TD PT72
1.0000 | MEDICATED_PATCH | TRANSDERMAL | Status: DC
  Administered 2020-10-19: 1.5 mg via TRANSDERMAL

## 2020-10-19 MED ORDER — SODIUM CHLORIDE (PF) 0.9 % IJ SOLN
INTRAMUSCULAR | Status: AC
  Filled 2020-10-19: qty 10

## 2020-10-19 MED ORDER — ONDANSETRON HCL 4 MG/2ML IJ SOLN
INTRAMUSCULAR | Status: AC
  Filled 2020-10-19: qty 2

## 2020-10-19 MED ORDER — SOD CITRATE-CITRIC ACID 500-334 MG/5ML PO SOLN: 30.0000 mL | ORAL | Status: AC

## 2020-10-19 MED ORDER — PROPOFOL 10 MG/ML IV BOLUS
INTRAVENOUS | Status: AC
  Filled 2020-10-19: qty 20

## 2020-10-19 MED ORDER — KETOROLAC TROMETHAMINE 30 MG/ML IJ SOLN
30.0000 mg | Freq: Four times a day (QID) | INTRAMUSCULAR | Status: DC
Start: 2020-10-19 — End: 2020-10-20

## 2020-10-19 MED ORDER — FENTANYL CITRATE (PF) 250 MCG/5ML IJ SOLN
INTRAMUSCULAR | Status: AC
  Filled 2020-10-19: qty 5

## 2020-10-19 MED ORDER — IBUPROFEN 200 MG PO TABS: 600.0000 mg | ORAL_TABLET | Freq: Four times a day (QID) | ORAL | Status: DC

## 2020-10-19 MED ORDER — BISACODYL 10 MG RE SUPP: 10.0000 mg | Freq: Every day | RECTAL | Status: DC | PRN

## 2020-10-19 MED ORDER — DOCUSATE SODIUM 100 MG PO CAPS
100.0000 mg | ORAL_CAPSULE | Freq: Two times a day (BID) | ORAL | Status: DC
  Administered 2020-10-19: 100 mg via ORAL

## 2020-10-19 MED ORDER — SUGAMMADEX SODIUM 200 MG/2ML IV SOLN
INTRAVENOUS | Status: DC | PRN
  Administered 2020-10-19: 350 mg via INTRAVENOUS

## 2020-10-19 MED ORDER — HYDROMORPHONE HCL 1 MG/ML IJ SOLN: 0.2500 mg | INTRAMUSCULAR | Status: DC | PRN

## 2020-10-19 MED ORDER — MEPERIDINE HCL 25 MG/ML IJ SOLN: 6.2500 mg | INTRAMUSCULAR | Status: DC | PRN

## 2020-10-19 MED ORDER — ONDANSETRON HCL 4 MG PO TABS: 4.0000 mg | ORAL_TABLET | Freq: Four times a day (QID) | ORAL | Status: DC | PRN

## 2020-10-19 MED ORDER — MIDAZOLAM HCL 2 MG/2ML IJ SOLN: 0.5000 mg | Freq: Once | INTRAMUSCULAR | Status: DC | PRN

## 2020-10-19 MED ORDER — ONDANSETRON HCL 4 MG/2ML IJ SOLN
INTRAMUSCULAR | Status: DC | PRN
  Administered 2020-10-19: 4 mg via INTRAVENOUS

## 2020-10-19 MED ORDER — DEXAMETHASONE SODIUM PHOSPHATE 10 MG/ML IJ SOLN
INTRAMUSCULAR | Status: DC | PRN
  Administered 2020-10-19: 10 mg via INTRAVENOUS

## 2020-10-19 MED ORDER — DEXTROSE IN LACTATED RINGERS 5 % IV SOLN: INTRAVENOUS | Status: DC

## 2020-10-19 MED ORDER — FENTANYL CITRATE (PF) 100 MCG/2ML IJ SOLN
INTRAMUSCULAR | Status: DC | PRN
  Administered 2020-10-19: 25 ug via INTRAVENOUS
  Administered 2020-10-19: 50 ug via INTRAVENOUS
  Administered 2020-10-19: 25 ug via INTRAVENOUS
  Administered 2020-10-19: 50 ug via INTRAVENOUS

## 2020-10-19 MED ORDER — PROPOFOL 10 MG/ML IV BOLUS
INTRAVENOUS | Status: DC | PRN
  Administered 2020-10-19: 200 mg via INTRAVENOUS
  Administered 2020-10-19: 20 mg via INTRAVENOUS

## 2020-10-19 MED ORDER — ALBUMIN HUMAN 5 % IV SOLN
12.5000 g | Freq: Once | INTRAVENOUS | Status: AC
  Administered 2020-10-19: 12.5 g via INTRAVENOUS

## 2020-10-19 MED ORDER — DOCUSATE SODIUM 100 MG PO CAPS
ORAL_CAPSULE | ORAL | Status: AC
  Filled 2020-10-19: qty 1

## 2020-10-19 MED ORDER — ESMOLOL HCL 100 MG/10ML IV SOLN
INTRAVENOUS | Status: DC | PRN
  Administered 2020-10-19: 10 mg via INTRAVENOUS

## 2020-10-19 MED ORDER — LIDOCAINE 2% (20 MG/ML) 5 ML SYRINGE
INTRAMUSCULAR | Status: DC | PRN
  Administered 2020-10-19: 20 mg via INTRAVENOUS

## 2020-10-19 MED ORDER — SUCCINYLCHOLINE CHLORIDE 200 MG/10ML IV SOSY
PREFILLED_SYRINGE | INTRAVENOUS | Status: DC | PRN
  Administered 2020-10-19: 160 mg via INTRAVENOUS

## 2020-10-19 MED ORDER — NALOXONE HCL 0.4 MG/ML IJ SOLN
INTRAMUSCULAR | Status: DC | PRN
  Administered 2020-10-19: 8 ug via INTRAVENOUS

## 2020-10-19 MED ORDER — ROCURONIUM BROMIDE 10 MG/ML (PF) SYRINGE
PREFILLED_SYRINGE | INTRAVENOUS | Status: AC
  Filled 2020-10-19: qty 10

## 2020-10-19 MED ORDER — ENOXAPARIN SODIUM 40 MG/0.4ML IJ SOSY
40.0000 mg | PREFILLED_SYRINGE | INTRAMUSCULAR | Status: DC
  Administered 2020-10-20: 40 mg via SUBCUTANEOUS

## 2020-10-19 MED ORDER — PROPOFOL 10 MG/ML IV BOLUS
INTRAVENOUS | Status: AC
  Filled 2020-10-19: qty 40

## 2020-10-19 MED ORDER — PHENYLEPHRINE 40 MCG/ML (10ML) SYRINGE FOR IV PUSH (FOR BLOOD PRESSURE SUPPORT)
PREFILLED_SYRINGE | INTRAVENOUS | Status: DC | PRN
  Administered 2020-10-19: 80 ug via INTRAVENOUS
  Administered 2020-10-19: 120 ug via INTRAVENOUS
  Administered 2020-10-19: 40 ug via INTRAVENOUS
  Administered 2020-10-19 (×2): 80 ug via INTRAVENOUS
  Administered 2020-10-19: 120 ug via INTRAVENOUS

## 2020-10-19 MED ORDER — HYDROMORPHONE HCL 1 MG/ML IJ SOLN: 0.2000 mg | INTRAMUSCULAR | Status: DC | PRN

## 2020-10-19 MED ORDER — NALOXONE HCL 0.4 MG/ML IJ SOLN
INTRAMUSCULAR | Status: AC
  Filled 2020-10-19: qty 1

## 2020-10-19 MED ORDER — PHENYLEPHRINE HCL-NACL 20-0.9 MG/250ML-% IV SOLN: INTRAVENOUS | Status: DC | PRN

## 2020-10-19 MED ORDER — ACETAMINOPHEN 500 MG PO TABS
1000.0000 mg | ORAL_TABLET | ORAL | Status: AC
  Administered 2020-10-19: 1000 mg via ORAL

## 2020-10-19 MED ORDER — MENTHOL 3 MG MT LOZG: 1.0000 | LOZENGE | OROMUCOSAL | Status: DC | PRN

## 2020-10-19 MED ORDER — KETOROLAC TROMETHAMINE 30 MG/ML IJ SOLN: 30.0000 mg | Freq: Once | INTRAMUSCULAR | Status: DC

## 2020-10-19 MED ORDER — TEMAZEPAM 15 MG PO CAPS: 15.0000 mg | ORAL_CAPSULE | Freq: Every evening | ORAL | Status: DC | PRN

## 2020-10-19 MED ORDER — LACTATED RINGERS IV SOLN: INTRAVENOUS | Status: DC | PRN

## 2020-10-19 MED ORDER — LIDOCAINE 2% (20 MG/ML) 5 ML SYRINGE
INTRAMUSCULAR | Status: DC | PRN
  Administered 2020-10-19: 25 mg via INTRAVENOUS

## 2020-10-19 MED ORDER — CEFAZOLIN SODIUM-DEXTROSE 2-4 GM/100ML-% IV SOLN
2.0000 g | INTRAVENOUS | Status: AC
  Administered 2020-10-19: 2 g via INTRAVENOUS

## 2020-10-19 MED ORDER — GABAPENTIN 300 MG PO CAPS
300.0000 mg | ORAL_CAPSULE | ORAL | Status: AC
  Administered 2020-10-19: 300 mg via ORAL

## 2020-10-19 MED ORDER — FENTANYL CITRATE (PF) 100 MCG/2ML IJ SOLN
INTRAMUSCULAR | Status: DC | PRN
  Administered 2020-10-19 (×2): 50 ug via INTRAVENOUS
  Administered 2020-10-19: 100 ug via INTRAVENOUS
  Administered 2020-10-19: 50 ug via INTRAVENOUS

## 2020-10-19 MED ORDER — ACETAMINOPHEN 500 MG PO TABS
1000.0000 mg | ORAL_TABLET | Freq: Four times a day (QID) | ORAL | Status: DC
  Administered 2020-10-19 – 2020-10-20 (×4): 1000 mg via ORAL

## 2020-10-19 MED ORDER — GABAPENTIN 300 MG PO CAPS
ORAL_CAPSULE | ORAL | Status: AC
  Filled 2020-10-19: qty 1

## 2020-10-19 SURGICAL SUPPLY — 23 items
AGENT HMST KT MTR STRL THRMB (HEMOSTASIS) ×1
DECANTER SPIKE VIAL GLASS SM (MISCELLANEOUS) IMPLANT
GAUZE 4X4 16PLY ~~LOC~~+RFID DBL (SPONGE) ×2 IMPLANT
GAUZE PACKING 2X5 YD STRL (GAUZE/BANDAGES/DRESSINGS) IMPLANT
GLOVE SURG LTX SZ7 (GLOVE) ×2 IMPLANT
GLOVE SURG UNDER POLY LF SZ6.5 (GLOVE) ×2 IMPLANT
GLOVE SURG UNDER POLY LF SZ7 (GLOVE) ×2 IMPLANT
GOWN STRL REUS W/TWL LRG LVL3 (GOWN DISPOSABLE) ×8 IMPLANT
KIT TURNOVER CYSTO (KITS) ×2 IMPLANT
NEEDLE SPNL 18GX3.5 QUINCKE PK (NEEDLE) ×2 IMPLANT
NS IRRIG 1000ML POUR BTL (IV SOLUTION) IMPLANT
PACK VAGINAL WOMENS (CUSTOM PROCEDURE TRAY) ×2 IMPLANT
PAD OB MATERNITY 4.3X12.25 (PERSONAL CARE ITEMS) ×2 IMPLANT
SURGIFLO W/THROMBIN 8M KIT (HEMOSTASIS) ×2 IMPLANT
SUT VIC AB 0 CT1 18XCR BRD8 (SUTURE) IMPLANT
SUT VIC AB 0 CT1 27 (SUTURE) ×2
SUT VIC AB 0 CT1 27XBRD ANBCTR (SUTURE) ×1 IMPLANT
SUT VIC AB 0 CT1 8-18 (SUTURE)
SUT VICRYL 0 TIES 12 18 (SUTURE) IMPLANT
SYR 20ML LL LF (SYRINGE) IMPLANT
TOWEL OR 17X26 10 PK STRL BLUE (TOWEL DISPOSABLE) ×2 IMPLANT
TRAY FOLEY W/BAG SLVR 14FR LF (SET/KITS/TRAYS/PACK) IMPLANT
UNDERPAD 30X36 HEAVY ABSORB (UNDERPADS AND DIAPERS) ×2 IMPLANT

## 2020-10-19 SURGICAL SUPPLY — 22 items
DECANTER SPIKE VIAL GLASS SM (MISCELLANEOUS) IMPLANT
GAUZE 4X4 16PLY ~~LOC~~+RFID DBL (SPONGE) ×4 IMPLANT
GAUZE PACKING 2X5 YD STRL (GAUZE/BANDAGES/DRESSINGS) ×2 IMPLANT
GLOVE SURG LTX SZ7 (GLOVE) ×4 IMPLANT
GLOVE SURG UNDER POLY LF SZ6.5 (GLOVE) ×2 IMPLANT
GLOVE SURG UNDER POLY LF SZ7 (GLOVE) ×4 IMPLANT
GOWN STRL REUS W/TWL LRG LVL3 (GOWN DISPOSABLE) ×10 IMPLANT
KIT TURNOVER CYSTO (KITS) ×2 IMPLANT
NEEDLE SPNL 18GX3.5 QUINCKE PK (NEEDLE) ×2 IMPLANT
NS IRRIG 1000ML POUR BTL (IV SOLUTION) ×2 IMPLANT
PACK VAGINAL WOMENS (CUSTOM PROCEDURE TRAY) ×2 IMPLANT
PAD OB MATERNITY 4.3X12.25 (PERSONAL CARE ITEMS) ×2 IMPLANT
SPONGE HEMORRHOID 8X3CM (HEMOSTASIS) ×2 IMPLANT
SUT VIC AB 0 CT1 18XCR BRD8 (SUTURE) ×3 IMPLANT
SUT VIC AB 0 CT1 27 (SUTURE) ×4
SUT VIC AB 0 CT1 27XBRD ANBCTR (SUTURE) ×2 IMPLANT
SUT VIC AB 0 CT1 8-18 (SUTURE) ×6
SUT VICRYL 0 TIES 12 18 (SUTURE) ×2 IMPLANT
SYR 20ML LL LF (SYRINGE) ×2 IMPLANT
TOWEL OR 17X26 10 PK STRL BLUE (TOWEL DISPOSABLE) ×4 IMPLANT
TRAY FOLEY W/BAG SLVR 14FR LF (SET/KITS/TRAYS/PACK) ×2 IMPLANT
UNDERPAD 30X36 HEAVY ABSORB (UNDERPADS AND DIAPERS) ×2 IMPLANT

## 2020-10-19 NOTE — Op Note (Signed)
Preoperative diagnosis: Pelvic pain  Postoperative diagnosis: Same  Procedure: Transvaginal hysterectomy  - bilateral salpingo-oophorectomy  Surgeon: Standley Dakins. Kennon Rounds, M.D.  Assistant: Edwinna Areola, MD An experienced assistant was required given the standard of surgical care given the complexity of the case.  This assistant was needed for exposure, dissection, suctioning, retraction, instrument exchange, and for overall help during the procedure.  Anesthesia: General ETT- Annye Asa, MD  Findings: Small uterus, normal tubes and ovaries.  Estimated blood loss: 150 cc  Specimen: Uterus to pathology  Reason for procedure: Patient had long h/o pelvic pain.  The patient desired definitive treatment.  Risks of  hysterectomy reviewed.  Risks include but are not limited to bleeding, infection, injury to surrounding structures, including bowel, bladder and ureters, blood clots, and death.  Likelihood of success of surgery is high.   Procedure: Patient was taken to the OR where she was placed in dorsal lithotomy in [place]. She was prepped and draped in the usual sterile fashion. A timeout was performed. The patient received 2 g of Ancef prior to procedure. The patient had SCDs in place.  A speculum was placed inside the vagina. The cervix was visualized and grasped with 2 doublle-tooth tenacula. 20 cc of 1% lidocaine with epinephrine were injected paracervically. A knife was used to make a circumferential incision around the vagina. An opened sponge was used to dissect the vagina off the cervix. The posterior peritoneum was entered sharply with Mayo scissors. The anterior peritoneal cavity was entered sharply with careful dissection of the bladder off the underlying cervix. A Heaney clamp was used to clamp first the left uterosacral ligament and cardinal which was then cut and Haney suture ligated with 0 Vicryl stitch, the stitch was held. Similarly the right uterosacral ligament was  clamped cut and suture ligated. Sequential bites up the broad to the uterine arteries were taken until the tubo-ovarian pedicles were encountered. The left utero-ovarian pedicle grasped with a Heaney clamp. The right utero-ovarian pedicle was similarly grasped with the Heaney clamp. The right left ovaries were easily visualized. The left ovary and tube were grasped with Babcock clamp and a Heaney clamp placed behind this. Tube and ovary were removed and the IP ligated with a free tie followed by suture ligature. The right tube and ovary were similarly grasped with a Babcock clamp and a Heaney clamp used to clamp behind this. The tube and ovary were removed on the side and the IP was ligated with a free tie followed by suture ligature. Inspection of all pedicles revealed adequate hemostasis. The vagina was closed with 0 Vicryl suture in a locked running fashion with care taken to incorporate the uterosacral pedicles. The vagina was thin and friable and several lacerations were noted and were repaired. There continued to be some oozing. A Foley catheter is placed inside her bladder. Clear, yellow urine was noted. Surgifoam and vaginal packing with Estrace placed to achieve hemostasis. All instrument needle and lap counts were correct x 2. Patient was awakened taken to recovery room in stable condition.  Donnamae Jude, MD 10/19/2020, 9:56 AM

## 2020-10-19 NOTE — Op Note (Signed)
Preoperative diagnosis: postoperative bleeding  Postoperative diagnosis: Same, friable vagina  Procedure: Exam under anesthesia, repair of vagina  Surgeon: Standley Dakins. Kennon Rounds, M.D.  Assistant: Edwinna Areola, M.D. An experienced assistant was required given the standard of surgical care given the complexity of the case.  This assistant was needed for exposure, dissection, suctioning, retraction, instrument exchange, and for overall help during the procedure.  Anesthesia: MAC, Annye Asa, MD during procedure, GETT was done due to LMA not working. See anesthesia notes.  Findings: cuff with < 3 mm laceration bleeding.  EBL: 250 cc  Reason for procedure: patient underwent TVH with BSO and had oozing at initial surgery and was noted to have a friable vagina due to testosterone and was packed. Bled through that. More surgifoam placed but re-exploration done due to some continued bleeding.  Procedure: Patient was placed in dorsal lithotomy after spinal analgesia. A  catheter was used to drain an unmeasured amount of urine. Time out performed. SCD's in place. Vagina was prepped. Upon prep, significant bleeding noted. Small deavers used with several assistants to allow for visualization. 3 figure of eights at the cuff placed. Bleeding noted from small tear on posterior cuff. Cauterized and then used [employer]. Watched for 20 minutes with no recurrence of bleeding. This concluded the procedure. Patient tolerated the procedure well. All instrument, needle, and lap counts were correct x 2.  Donnamae Jude, MD 10/19/2020 2:28 PM

## 2020-10-19 NOTE — Anesthesia Postprocedure Evaluation (Signed)
Anesthesia Post Note  Patient: Victoria Hill  Procedure(s) Performed: VAGINAL HYSTERECTOMY  with BSO (Bilateral)     Patient location during evaluation: PACU Anesthesia Type: General Level of consciousness: awake and alert, patient cooperative and oriented Pain management: pain level controlled Vital Signs Assessment: post-procedure vital signs reviewed and stable Respiratory status: spontaneous breathing, nonlabored ventilation and respiratory function stable Cardiovascular status: blood pressure returned to baseline and stable Postop Assessment: no apparent nausea or vomiting Anesthetic complications: no   No notable events documented.  Last Vitals:  Vitals:   10/19/20 1015 10/19/20 1032  BP:  122/70  Pulse: 94 91  Resp: 20 14  Temp:    SpO2: 100% 100%    Last Pain:  Vitals:   10/19/20 0942  TempSrc: Oral  PainSc:                  [name],E. Kiala Faraj

## 2020-10-19 NOTE — Progress Notes (Signed)
Upon arrival to [facility], pt walked from door of room to bed with assistance. Noted bleeding on gown, upon assessment pt noted with saturated peripad and saturated visible vaginal packing. Perineal cleansing performed, VS stable, pt c/o 2/10 lower abdominal and back pain. Medicated per MD order. Within 30 minutes of reassessment, pt had completely saturated perineal cloth and maxipad with sanguinous vaginal drainage. Visual active oozing/bleeding noted at vaginal opening around packing. MD and anesthesia notified, anesthesia to bedside. Albumin 250 ml given per anesthesia, 2nd PIV site started, VSS, CBC WNL. MD to bedside by 1215; MD removed vaginal packing and placed surgi-foam x 2. During this time several quarter-sized clots noted from vaginal area. VS remain stable, pt denies pain. MD will return to check pt status, will continue to monitor.

## 2020-10-19 NOTE — Transfer of Care (Signed)
Immediate Anesthesia Transfer of Care Note  Patient: Victoria Hill  Procedure(s) Performed: EXAM UNDER ANESTHESIA  Patient Location: PACU  Anesthesia Type:General  Level of Consciousness: drowsy, patient cooperative and responds to stimulation  Airway & Oxygen Therapy: Patient Spontanous Breathing and Patient connected to face mask oxygen  Post-op Assessment: Report given to RN and Post -op Vital signs reviewed and stable  Post vital signs: Reviewed and stable  Last Vitals:  Vitals Value Taken Time  BP 105/66 10/19/20 1446  Temp 37.1 C 10/19/20 1446  Pulse 98 10/19/20 1452  Resp 10 10/19/20 1452  SpO2 100 % 10/19/20 1452  Vitals shown include unvalidated device data.  Last Pain:  Vitals:   10/19/20 1316  TempSrc:   PainSc: 0-No pain      Patients Stated Pain Goal: 5 (58/85/02 7741)  Complications: No notable events documented.

## 2020-10-19 NOTE — Anesthesia Procedure Notes (Signed)
Procedure Name: Intubation Date/Time: 10/19/2020 7:41 AM Performed by: Gwyndolyn Saxon, CRNA Pre-anesthesia Checklist: Patient identified, Emergency Drugs available, Suction available and Patient being monitored Patient Re-evaluated:Patient Re-evaluated prior to induction Oxygen Delivery Method: Circle system utilized Preoxygenation: Pre-oxygenation with 100% oxygen Induction Type: IV induction Ventilation: Mask ventilation without difficulty Laryngoscope Size: Miller and 2 Grade View: Grade I Tube type: Oral Tube size: 7.0 mm Number of attempts: 1 Airway Equipment and Method: Stylet Placement Confirmation: ETT inserted through vocal cords under direct vision, positive ETCO2 and breath sounds checked- equal and bilateral Secured at: 22 cm Tube secured with: Tape Dental Injury: Teeth and Oropharynx as per pre-operative assessment  Comments: Pt with poor dentition; multiple cracked and broken teeth. Atraumatic intubation. All dentition intact.

## 2020-10-19 NOTE — Interval H&P Note (Signed)
History and Physical Interval Note:  10/19/2020 7:07 AM  Victoria Hill  has presented today for surgery, with the diagnosis of Pelvic pain.  The various methods of treatment have been discussed with the patient and family. After consideration of risks, benefits and other options for treatment, the patient has consented to  Procedure(s) with comments: HYSTERECTOMY VAGINAL with BSO (Bilateral) - With BSO add 30 minutes OR time. as a surgical intervention.  The patient's history has been reviewed, patient examined, no change in status, stable for surgery.  I have reviewed the patient's chart and labs.  Questions were answered to the patient's satisfaction.     Donnamae Jude

## 2020-10-19 NOTE — Progress Notes (Signed)
Pt to OR.

## 2020-10-19 NOTE — Anesthesia Postprocedure Evaluation (Signed)
Anesthesia Post Note  Patient: Victoria Hill  Procedure(s) Performed: EXAM UNDER ANESTHESIA     Patient location during evaluation: PACU Anesthesia Type: General Level of consciousness: sedated, patient cooperative and oriented Pain management: pain level controlled (pt states he is having no pain) Vital Signs Assessment: post-procedure vital signs reviewed and stable Respiratory status: spontaneous breathing, nonlabored ventilation, respiratory function stable and patient connected to nasal cannula oxygen Cardiovascular status: blood pressure returned to baseline and stable Postop Assessment: no apparent nausea or vomiting Anesthetic complications: no   No notable events documented.  Last Vitals:  Vitals:   10/19/20 1316 10/19/20 1446  BP: 126/74 105/66  Pulse: (!) 109 92  Resp:  12  Temp:  37.1 C  SpO2: 97% 100%    Last Pain:  Vitals:   10/19/20 1446  TempSrc:   PainSc: Asleep                 [name],E. Brixton Franko

## 2020-10-19 NOTE — Progress Notes (Signed)
Called to see patient secondary to oozing. Dr. Sabra Heck had evaulated the patient and noted some oozing. On my exam the packing was out. We cleaned him up and placed 2 more surgi-foams to help stop bleeding until the OR is open for re-look. Suspect the vagina is bleeding due to fragility. If bleeding stops, may avoid return to the OR. His vitals are stable and his Hgb was 14 on iStat.

## 2020-10-19 NOTE — Transfer of Care (Signed)
Immediate Anesthesia Transfer of Care Note  Patient: Victoria Hill  Procedure(s) Performed: VAGINAL HYSTERECTOMY  with BSO (Bilateral)  Patient Location: PACU  Anesthesia Type:General  Level of Consciousness: drowsy and patient cooperative  Airway & Oxygen Therapy: Patient Spontanous Breathing and Patient connected to face mask oxygen  Post-op Assessment: Report given to RN and Post -op Vital signs reviewed and stable  Post vital signs: Reviewed and stable  Last Vitals:  Vitals Value Taken Time  BP 119/66 10/19/20 0942  Temp 36.5 C 10/19/20 0942  Pulse 77 10/19/20 0944  Resp 14 10/19/20 0944  SpO2 100 % 10/19/20 0944  Vitals shown include unvalidated device data.  Last Pain:  Vitals:   10/19/20 0942  TempSrc: Oral  PainSc:       Patients Stated Pain Goal: 5 (22/44/97 5300)  Complications: No notable events documented.

## 2020-10-19 NOTE — Progress Notes (Signed)
Spoke with Dr. Kennon Rounds to clarify if patient is to be admitted to the hospital or going to [facility] to be discharged in the morning. Patient is to go to [facility] for overnight stay then discharged. Lovenox and Toradol is to be held until the CBC results are back in the morning. If Hgb is stable they can be administered per Dr. Kennon Rounds.

## 2020-10-19 NOTE — Anesthesia Procedure Notes (Signed)
Procedure Name: LMA Insertion Date/Time: 10/19/2020 1:31 PM Performed by: Rogers Blocker, CRNA Pre-anesthesia Checklist: Patient identified, Emergency Drugs available, Suction available and Patient being monitored Patient Re-evaluated:Patient Re-evaluated prior to induction Oxygen Delivery Method: Circle System Utilized Preoxygenation: Pre-oxygenation with 100% oxygen Induction Type: IV induction Ventilation: Mask ventilation without difficulty LMA: LMA inserted LMA Size: 4.0 Number of attempts: 1 Placement Confirmation: positive ETCO2 Tube secured with: Tape Dental Injury: Teeth and Oropharynx as per pre-operative assessment

## 2020-10-19 NOTE — Progress Notes (Signed)
Upon reassessment, VSS, pt noted with moderate amount sanguinous drainage to peripad, small amount active vaginal oozing noted. MD at bedside and notified.

## 2020-10-19 NOTE — Progress Notes (Signed)
Scant sanguinous drainage noted to peripad, no active bleeding/oozing noted vaginally. VSS.

## 2020-10-19 NOTE — Anesthesia Preprocedure Evaluation (Signed)
Anesthesia Evaluation  Patient identified by MRN, date of birth, ID band Patient awake    Reviewed: Allergy & Precautions, NPO status , Patient's Chart, lab work & pertinent test results  History of Anesthesia Complications Negative for: history of anesthetic complications  Airway Mallampati: I  TM Distance: >3 FB Neck ROM: Full    Dental  (+) Dental Advisory Given, Poor Dentition, Chipped, Missing   Pulmonary neg pulmonary ROS,  10/18/2020 SARS coronavirus NEG   breath sounds clear to auscultation       Cardiovascular negative cardio ROS   Rhythm:Regular Rate:Normal     Neuro/Psych Anxiety Depression negative neurological ROS     GI/Hepatic negative GI ROS, Neg liver ROS,   Endo/Other  Takes testosterone  Renal/GU negative Renal ROS     Musculoskeletal   Abdominal (+) + obese,   Peds  Hematology Hb 14.6   Anesthesia Other Findings Transitioning female-female  Reproductive/Obstetrics Bleeding, post-op robotic hysterectomy                             Anesthesia Physical Anesthesia Plan  ASA: 2 and emergent  Anesthesia Plan: General   Post-op Pain Management:    Induction: Intravenous  PONV Risk Score and Plan: 3 and Ondansetron and Dexamethasone  Airway Management Planned: LMA  Additional Equipment: None  Intra-op Plan:   Post-operative Plan:   Informed Consent: I have reviewed the patients History and Physical, chart, labs and discussed the procedure including the risks, benefits and alternatives for the proposed anesthesia with the patient or authorized representative who has indicated his/her understanding and acceptance.     Dental advisory given  Plan Discussed with: CRNA and Surgeon  Anesthesia Plan Comments:         Anesthesia Quick Evaluation

## 2020-10-20 ENCOUNTER — Encounter (HOSPITAL_BASED_OUTPATIENT_CLINIC_OR_DEPARTMENT_OTHER): Payer: Self-pay | Admitting: Family Medicine

## 2020-10-20 ENCOUNTER — Telehealth: Payer: Self-pay | Admitting: Radiology

## 2020-10-20 DIAGNOSIS — D251 Intramural leiomyoma of uterus: Secondary | ICD-10-CM | POA: Diagnosis not present

## 2020-10-20 DIAGNOSIS — N83201 Unspecified ovarian cyst, right side: Secondary | ICD-10-CM | POA: Diagnosis not present

## 2020-10-20 DIAGNOSIS — N83202 Unspecified ovarian cyst, left side: Secondary | ICD-10-CM | POA: Diagnosis not present

## 2020-10-20 DIAGNOSIS — R102 Pelvic and perineal pain: Secondary | ICD-10-CM | POA: Diagnosis not present

## 2020-10-20 DIAGNOSIS — Z8616 Personal history of COVID-19: Secondary | ICD-10-CM | POA: Diagnosis not present

## 2020-10-20 DIAGNOSIS — N99820 Postprocedural hemorrhage and hematoma of a genitourinary system organ or structure following a genitourinary system procedure: Secondary | ICD-10-CM | POA: Diagnosis not present

## 2020-10-20 LAB — CBC
HCT: 35.3 % — ABNORMAL LOW (ref 36.0–46.0)
Hemoglobin: 11.8 g/dL — ABNORMAL LOW (ref 12.0–15.0)
MCH: 30.6 pg (ref 26.0–34.0)
MCHC: 33.4 g/dL (ref 30.0–36.0)
MCV: 91.5 fL (ref 80.0–100.0)
Platelets: 287 10*3/uL (ref 150–400)
RBC: 3.86 MIL/uL — ABNORMAL LOW (ref 3.87–5.11)
RDW: 13.8 % (ref 11.5–15.5)
WBC: 8 10*3/uL (ref 4.0–10.5)
nRBC: 0 % (ref 0.0–0.2)

## 2020-10-20 MED ORDER — ENOXAPARIN SODIUM 40 MG/0.4ML IJ SOSY
PREFILLED_SYRINGE | INTRAMUSCULAR | Status: AC
  Filled 2020-10-20: qty 0.4

## 2020-10-20 MED ORDER — ACETAMINOPHEN 500 MG PO TABS
ORAL_TABLET | ORAL | Status: AC
  Filled 2020-10-20: qty 2

## 2020-10-20 MED ORDER — OXYCODONE HCL 5 MG PO TABS
ORAL_TABLET | ORAL | Status: AC
  Filled 2020-10-20: qty 1

## 2020-10-20 NOTE — Discharge Summary (Addendum)
Physician Discharge Summary  Patient ID: Victoria Hill MRN: [id number] DOB/AGE: 1985-08-13 35 y.o.  Admit date: 10/19/2020 Discharge date: 10/20/2020  Admission Diagnoses:  Principal Problem:   Pelvic pain Active Problems:   Status post hysterectomy   Discharge Diagnoses:  Same  Past Medical History:  Diagnosis Date   Anxiety    COVID toes 2020   BREATHING ISSUES AND PAIN X 2 TO 3 MONTHS ALL ISSUES RESOLVED   Depression    Wears glasses 10/12/2020    Surgeries: Procedure(s): TVH/BSO EXAM UNDER ANESTHESIA on 10/19/2020   Consultants: None  Discharged Condition: Improved  Hospital Course: Victoria Hill is an 35 y.o. adult who was admitted 10/19/2020 with a chief complaint of pelvic pain. , and found to have a diagnosis of Pelvic pain.  They were brought to the operating room on 10/19/2020 and underwent the above named procedures.    He was given perioperative antibiotics:  Anti-infectives (From admission, onward)    Start     Dose/Rate Route Frequency Ordered Stop   10/19/20 0600  ceFAZolin (ANCEF) IVPB 2g/100 mL premix        2 g 200 mL/hr over 30 Minutes Intravenous On call to O.R. 10/19/20 6295 10/19/20 2841     . Postoperative bleeding resulted in return to OR for re-repair of vaginal laceration and surgiflow with excellent results.  He was given sequential compression devices, early ambulation, and chemoprophylaxis for DVT prophylaxis.  He benefited maximally from their hospital stay and there were no complications. She was ambulating, voiding, tolerating po and deemed stable for discharge.  Recent vital signs:  Vitals:   10/20/20 0215 10/20/20 0530  BP: (!) 108/54 103/60  Pulse: 86 78  Resp: 16   Temp: 98.4 F (36.9 C) 98.2 F (36.8 C)  SpO2: 97% 97%   Discharge exam: Physical Examination: General appearance - alert, well appearing, and in no distress Chest - normal effort Heart - normal rate and regular rhythm Abdomen - soft, appropriately tender,  dressing is clean and dry Extremities - peripheral pulses normal, no pedal edema, no clubbing or cyanosis, Homan's sign negative bilaterally Recent laboratory studies:  Results for orders placed or performed during the hospital encounter of 10/19/20  CBC  Result Value Ref Range   WBC 15.5 (H) 4.0 - 10.5 K/uL   RBC 4.70 3.87 - 5.11 MIL/uL   Hemoglobin 14.3 12.0 - 15.0 g/dL   HCT 42.2 36.0 - 46.0 %   MCV 89.8 80.0 - 100.0 fL   MCH 30.4 26.0 - 34.0 pg   MCHC 33.9 30.0 - 36.0 g/dL   RDW 13.5 11.5 - 15.5 %   Platelets 306 150 - 400 K/uL   nRBC 0.0 0.0 - 0.2 %  Creatinine, serum  Result Value Ref Range   Creatinine, Ser 0.98 0.44 - 1.00 mg/dL   GFR, Estimated >60 >60 mL/min  CBC  Result Value Ref Range   WBC 8.0 4.0 - 10.5 K/uL   RBC 3.86 (L) 3.87 - 5.11 MIL/uL   Hemoglobin 11.8 (L) 12.0 - 15.0 g/dL   HCT 35.3 (L) 36.0 - 46.0 %   MCV 91.5 80.0 - 100.0 fL   MCH 30.6 26.0 - 34.0 pg   MCHC 33.4 30.0 - 36.0 g/dL   RDW 13.8 11.5 - 15.5 %   Platelets 287 150 - 400 K/uL   nRBC 0.0 0.0 - 0.2 %  Pregnancy, urine POC  Result Value Ref Range   Preg Test, Ur NEGATIVE NEGATIVE  I-STAT, chem 8  Result Value Ref Range   Sodium 140 135 - 145 mmol/L   Potassium 3.3 (L) 3.5 - 5.1 mmol/L   Chloride 100 98 - 111 mmol/L   BUN 10 6 - 20 mg/dL   Creatinine, Ser 0.90 0.44 - 1.00 mg/dL   Glucose, Bld 168 (H) 70 - 99 mg/dL   Calcium, Ion 1.06 (L) 1.15 - 1.40 mmol/L   TCO2 27 22 - 32 mmol/L   Hemoglobin 14.6 12.0 - 15.0 g/dL   HCT 43.0 36.0 - 46.0 %  ABO/Rh  Result Value Ref Range   ABO/RH(D)      A POS Performed at [facility] [address], [place], [place] [address]     Discharge Medications:   Allergies as of 10/20/2020   No Known Allergies      Medication List     TAKE these medications    buPROPion 150 MG 24 hr tablet Commonly known as: Wellbutrin XL Take 1 tablet (150 mg total) by mouth every morning.   escitalopram 10 MG tablet Commonly known as:  Lexapro Take 1 tablet (10 mg total) by mouth daily.   OVER THE COUNTER MEDICATION Zinc po 50 mg daily   oxyCODONE-acetaminophen 5-325 MG tablet Commonly known as: PERCOCET/ROXICET Take 1-2 tablets by mouth every 6 (six) hours as needed.   TESTOSTERONE CYPIONATE IM Inject 100 mg into the muscle every 7 (seven) days.   VITAMIN C PO Take 100 mg by mouth.   VITAMIN D PO Take 50 mg by mouth.        Diagnostic Studies: No results found.  Disposition: Discharge disposition: 01-Home or Self Care       Discharge Instructions      Remove dressing in 72 hours   Complete by: As directed    Call MD for:  persistant nausea and vomiting   Complete by: As directed    Call MD for:  redness, tenderness, or signs of infection (pain, swelling, redness, odor or green/yellow discharge around incision site)   Complete by: As directed    Call MD for:  severe uncontrolled pain   Complete by: As directed    Call MD for:  temperature >100.4   Complete by: As directed    Diet - low sodium heart healthy   Complete by: As directed    Driving Restrictions   Complete by: As directed    None while taking narcotic pain meds   Increase activity slowly   Complete by: As directed    Lifting restrictions   Complete by: As directed    Nothing > 20 lbs x 6 weeks   Sexual Activity Restrictions   Complete by: As directed    Nothing per vagina x 6 weeks        Follow-up [facility] [place] [facility] Follow up in 2 week(s).   Specialty: Obstetrics and Gynecology Why: postop check, they will call you with an appointment Contact information: [place] [phone]                 Signed: Donnamae Jude 10/20/2020, 7:22 AM

## 2020-10-20 NOTE — Telephone Encounter (Signed)
Left voicemail with postop appointment information for 11/04/20 @ 1:00 with Dr Kennon Rounds

## 2020-10-21 LAB — SURGICAL PATHOLOGY

## 2020-10-22 ENCOUNTER — Ambulatory Visit: Payer: BC Managed Care – PPO | Admitting: Osteopathic Medicine

## 2020-10-26 ENCOUNTER — Ambulatory Visit (INDEPENDENT_AMBULATORY_CARE_PROVIDER_SITE_OTHER): Payer: BC Managed Care – PPO | Admitting: Sports Medicine

## 2020-10-26 VITALS — BP 116/81 | HR 82 | Resp 20 | Ht 65.0 in | Wt 191.0 lb

## 2020-10-26 DIAGNOSIS — F640 Transsexualism: Secondary | ICD-10-CM | POA: Diagnosis not present

## 2020-10-26 DIAGNOSIS — IMO0001 Reserved for inherently not codable concepts without codable children: Secondary | ICD-10-CM

## 2020-10-26 DIAGNOSIS — Z79899 Other long term (current) drug therapy: Secondary | ICD-10-CM | POA: Diagnosis not present

## 2020-10-26 MED ORDER — TESTOSTERONE CYPIONATE 100 MG/ML IM SOLN
100.0000 mg | Freq: Once | INTRAMUSCULAR | Status: AC
  Administered 2020-10-26: 100 mg via INTRAMUSCULAR

## 2020-10-26 NOTE — Progress Notes (Signed)
Established Patient Office Visit  Subjective:  Patient ID: Victoria Hill, adult    DOB: 20-Jun-1985  Age: 35 y.o. MRN: [id number]  CC:  Chief Complaint  Patient presents with   Testosterone Injection    HPI Victoria Hill presents for a testosterone injection. Patient tolerated testosterone 100 mg injection well without complications. Patient advised to schedule next injection in 7 days.   Past Medical History:  Diagnosis Date   Anxiety    COVID toes 2020   BREATHING ISSUES AND PAIN X 2 TO 3 MONTHS ALL ISSUES RESOLVED   Depression    Wears glasses 10/12/2020    Past Surgical History:  Procedure Laterality Date   CHEST SURGERY  2018   double mastectomy   VAGINAL HYSTERECTOMY Bilateral 10/19/2020   Procedure: VAGINAL HYSTERECTOMY  with BSO;  Surgeon: Victoria Jude, MD;  Location: [facility];  Service: Gynecology;  Laterality: Bilateral;  With BSO add 30 minutes OR time.    No family history on file.  Social History   Socioeconomic History   Marital status: Single    Spouse name: Not on file   Number of children: Not on file   Years of education: Not on file   Highest education level: Not on file  Occupational History   Not on file  Tobacco Use   Smoking status: Never   Smokeless tobacco: Never  Vaping Use   Vaping Use: Never used  Substance and Sexual Activity   Alcohol use: Not Currently   Drug use: Never   Sexual activity: Never    Partners: Female  Other Topics Concern   Not on file  Social History Narrative   Not on file   Social Determinants of Health   Financial Resource Strain: Not on file  Food Insecurity: Not on file  Transportation Needs: Not on file  Physical Activity: Not on file  Stress: Not on file  Social Connections: Not on file  Intimate Partner Violence: Not on file    Outpatient Medications Prior to Visit  Medication Sig Dispense Refill   Ascorbic Acid (VITAMIN C PO) Take 100 mg by mouth.     buPROPion (WELLBUTRIN XL)  150 MG 24 hr tablet Take 1 tablet (150 mg total) by mouth every morning. 90 tablet 3   escitalopram (LEXAPRO) 10 MG tablet Take 1 tablet (10 mg total) by mouth daily. 90 tablet 0   OVER THE COUNTER MEDICATION Zinc po 50 mg daily     oxyCODONE-acetaminophen (PERCOCET/ROXICET) 5-325 MG tablet Take 1-2 tablets by mouth every 6 (six) hours as needed. 20 tablet 0   TESTOSTERONE CYPIONATE IM Inject 100 mg into the muscle every 7 (seven) days.     VITAMIN D PO Take 50 mg by mouth.     Facility-Administered Medications Prior to Visit  Medication Dose Route Frequency Provider Last Rate Last Admin   testosterone cypionate (DEPOTESTOSTERONE CYPIONATE) injection 100 mg  100 mg Intramuscular Weekly Emeterio Reeve, DO   100 mg at 10/06/20 1036    No Known Allergies  ROS Review of Systems    Objective:    Physical Exam  BP 116/81 (BP Location: Left Arm, Patient Position: Sitting, Cuff Size: Large)   Pulse 82   Resp 20   Ht 5\' 5"  (1.651 m)   Wt 191 lb (86.6 kg)   SpO2 98%   BMI 31.78 kg/m  Wt Readings from Last 3 Encounters:  10/26/20 191 lb (86.6 kg)  10/19/20 191 lb 9.6 oz (86.9  kg)  10/12/20 194 lb (88 kg)     Health Maintenance Due  Topic Date Due   HIV Screening  Never done   Hepatitis C Screening  Never done   COVID-19 Vaccine (3 - Booster for Janssen series) 01/22/2020    There are no preventive care reminders to display for this patient.  No results found for: TSH Lab Results  Component Value Date   WBC 8.0 10/20/2020   HGB 11.8 (L) 10/20/2020   HCT 35.3 (L) 10/20/2020   MCV 91.5 10/20/2020   PLT 287 10/20/2020   Lab Results  Component Value Date   NA 140 10/19/2020   K 3.3 (L) 10/19/2020   CO2 30 06/15/2020   GLUCOSE 168 (H) 10/19/2020   BUN 10 10/19/2020   CREATININE 0.90 10/19/2020   BILITOT 0.8 06/15/2020   AST 18 06/15/2020   ALT 30 (H) 06/15/2020   PROT 6.9 06/15/2020   CALCIUM 9.2 06/15/2020   Lab Results  Component Value Date   CHOL 167  06/15/2020   Lab Results  Component Value Date   HDL 35 (L) 06/15/2020   Lab Results  Component Value Date   LDLCALC 103 (H) 06/15/2020   Lab Results  Component Value Date   TRIG 169 (H) 06/15/2020   Lab Results  Component Value Date   CHOLHDL 4.8 06/15/2020   No results found for: HGBA1C    Assessment & Plan:  Patient tolerated testosterone 100 mg injection well without complications. Patient advised to schedule next injection in 7 days.  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    [name], CMA

## 2020-11-02 ENCOUNTER — Ambulatory Visit (INDEPENDENT_AMBULATORY_CARE_PROVIDER_SITE_OTHER): Payer: BC Managed Care – PPO | Admitting: Physician Assistant

## 2020-11-02 VITALS — BP 122/77 | HR 86 | Ht 65.0 in | Wt 192.0 lb

## 2020-11-02 DIAGNOSIS — Z789 Other specified health status: Secondary | ICD-10-CM | POA: Diagnosis not present

## 2020-11-02 NOTE — Progress Notes (Signed)
HPI: Patient is here for a testosterone shot. Denies any adverse reactions to medication.   Assessment and Plan: Patient tolerated injection well. Received injection in left glute and scheduled his next injection in 1 week.

## 2020-11-02 NOTE — Progress Notes (Signed)
Agree with above plan. 

## 2020-11-04 ENCOUNTER — Ambulatory Visit (INDEPENDENT_AMBULATORY_CARE_PROVIDER_SITE_OTHER): Payer: BC Managed Care – PPO | Admitting: Family Medicine

## 2020-11-04 ENCOUNTER — Encounter: Payer: Self-pay | Admitting: Family Medicine

## 2020-11-04 ENCOUNTER — Other Ambulatory Visit: Payer: Self-pay

## 2020-11-04 VITALS — BP 126/80 | HR 92 | Ht 65.0 in | Wt 190.2 lb

## 2020-11-04 DIAGNOSIS — Z09 Encounter for follow-up examination after completed treatment for conditions other than malignant neoplasm: Secondary | ICD-10-CM

## 2020-11-04 NOTE — Progress Notes (Signed)
    Subjective:    Patient ID: Victoria Hill is a 35 y.o. adult presenting with Follow-up  on 11/04/2020  HPI: Here for post op check. He is s/p TVH with BSO on 10/19/2020. Pathology reviewed and WNL. He is not having any bleeding. Has occasional pain with driving and acute changes in positions. Enquires about return to work. Has a physical job.  Review of Systems  Constitutional:  Negative for chills and fever.  Respiratory:  Negative for shortness of breath.   Cardiovascular:  Negative for chest pain.  Gastrointestinal:  Negative for abdominal pain, nausea and vomiting.  Genitourinary:  Negative for dysuria.  Skin:  Negative for rash.     Objective:    BP 126/80   Pulse 92   Ht 5\' 5"  (1.651 m)   Wt 190 lb 3.2 oz (86.3 kg)   BMI 31.65 kg/m  Physical Exam Constitutional:      General: He is not in acute distress.    Appearance: He is well-developed.  HENT:     Head: Normocephalic and atraumatic.  Eyes:     General: No scleral icterus. Cardiovascular:     Rate and Rhythm: Normal rate.  Pulmonary:     Effort: Pulmonary effort is normal.  Abdominal:     Palpations: Abdomen is soft.  Musculoskeletal:     Cervical back: Neck supple.  Skin:    General: Skin is warm and dry.  Neurological:     Mental Status: He is alert and oriented to person, place, and time.        Assessment & Plan:   Postop check - Doing well. Return to work 11/1 unless still having pain in week prior.   Return in about 4 weeks (around 12/02/2020) for virtual.  Donnamae Jude 11/04/2020 1:13 PM

## 2020-11-04 NOTE — Progress Notes (Signed)
Pt presents for f/u after hysterectomy on 10/19/20. Pt experiencing low abdominal pain 1/10. Denies bleeding

## 2020-11-09 ENCOUNTER — Ambulatory Visit (INDEPENDENT_AMBULATORY_CARE_PROVIDER_SITE_OTHER): Payer: BC Managed Care – PPO | Admitting: Family Medicine

## 2020-11-09 VITALS — BP 120/79 | HR 83 | Wt 194.0 lb

## 2020-11-09 DIAGNOSIS — Z789 Other specified health status: Secondary | ICD-10-CM | POA: Diagnosis not present

## 2020-11-09 MED ORDER — TESTOSTERONE CYPIONATE 200 MG/ML IM SOLN
100.0000 mg | Freq: Once | INTRAMUSCULAR | Status: AC
  Administered 2020-11-09: 100 mg via INTRAMUSCULAR

## 2020-11-09 NOTE — Progress Notes (Signed)
HPI:  Patient is here for a testosterone injection.  Denies chest pains, shortness of breath, headaches and problems with medication or mood changes.  Assessment and Plan:  Patient tolerated injection well without complications.  Patient advised to schedule next injection in 7 days.  Charyl Bigger, CMA

## 2020-11-09 NOTE — Progress Notes (Signed)
Medical screening examination/treatment was performed by qualified clinical staff member and as supervising physician I was immediately available for consultation/collaboration. I have reviewed documentation and agree with assessment and plan.  Kimberley Dastrup, DO  

## 2020-11-16 ENCOUNTER — Ambulatory Visit (INDEPENDENT_AMBULATORY_CARE_PROVIDER_SITE_OTHER): Payer: BC Managed Care – PPO | Admitting: Family Medicine

## 2020-11-16 DIAGNOSIS — Z789 Other specified health status: Secondary | ICD-10-CM

## 2020-11-16 MED ORDER — TESTOSTERONE CYPIONATE 200 MG/ML IM SOLN
100.0000 mg | Freq: Once | INTRAMUSCULAR | Status: AC
  Administered 2020-11-16: 100 mg via INTRAMUSCULAR

## 2020-11-16 NOTE — Progress Notes (Signed)
Agree with documentation as above.   Ishmael Berkovich, MD  

## 2020-11-16 NOTE — Progress Notes (Signed)
HPI:  Patient is here for a testosterone injection.  Denies chest pains, shortness of breath, headaches and problems with medication or mood changes.  Assessment and Plan:  Patient tolerated injection well without complications.  Patient advised to schedule next injection in 14 days.  Charyl Bigger, CMA

## 2020-11-23 ENCOUNTER — Ambulatory Visit (INDEPENDENT_AMBULATORY_CARE_PROVIDER_SITE_OTHER): Payer: BC Managed Care – PPO | Admitting: Medical-Surgical

## 2020-11-23 ENCOUNTER — Other Ambulatory Visit: Payer: Self-pay

## 2020-11-23 VITALS — BP 121/71 | HR 82

## 2020-11-23 DIAGNOSIS — Z79899 Other long term (current) drug therapy: Secondary | ICD-10-CM

## 2020-11-23 DIAGNOSIS — Z789 Other specified health status: Secondary | ICD-10-CM

## 2020-11-23 DIAGNOSIS — F640 Transsexualism: Secondary | ICD-10-CM | POA: Diagnosis not present

## 2020-11-23 NOTE — Progress Notes (Signed)
Established Patient Office Visit  Subjective:  Patient ID: Victoria Hill, adult    DOB: 03/26/1985  Age: 35 y.o. MRN: [id number]  CC:  Chief Complaint  Patient presents with   Injections    HPI Victoria Hill is here for a testosterone injection. Denies chest pain, shortness of breath, headaches or mood changes.    Past Medical History:  Diagnosis Date   Anxiety    COVID toes 2020   BREATHING ISSUES AND PAIN X 2 TO 3 MONTHS ALL ISSUES RESOLVED   Depression    Wears glasses 10/12/2020    Past Surgical History:  Procedure Laterality Date   CHEST SURGERY  2018   double mastectomy   VAGINAL HYSTERECTOMY Bilateral 10/19/2020   Procedure: VAGINAL HYSTERECTOMY  with BSO;  Surgeon: Donnamae Jude, MD;  Location: [facility];  Service: Gynecology;  Laterality: Bilateral;  With BSO add 30 minutes OR time.    History reviewed. No pertinent family history.  Social History   Socioeconomic History   Marital status: Single    Spouse name: Not on file   Number of children: Not on file   Years of education: Not on file   Highest education level: Not on file  Occupational History   Not on file  Tobacco Use   Smoking status: Never   Smokeless tobacco: Never  Vaping Use   Vaping Use: Never used  Substance and Sexual Activity   Alcohol use: Not Currently   Drug use: Never   Sexual activity: Never    Partners: Female  Other Topics Concern   Not on file  Social History Narrative   Not on file   Social Determinants of Health   Financial Resource Strain: Not on file  Food Insecurity: Not on file  Transportation Needs: Not on file  Physical Activity: Not on file  Stress: Not on file  Social Connections: Not on file  Intimate Partner Violence: Not on file    Outpatient Medications Prior to Visit  Medication Sig Dispense Refill   Ascorbic Acid (VITAMIN C PO) Take 100 mg by mouth.     buPROPion (WELLBUTRIN XL) 150 MG 24 hr tablet Take 1 tablet (150 mg total) by  mouth every morning. 90 tablet 3   escitalopram (LEXAPRO) 10 MG tablet Take 1 tablet (10 mg total) by mouth daily. 90 tablet 0   OVER THE COUNTER MEDICATION Zinc po 50 mg daily     oxyCODONE-acetaminophen (PERCOCET/ROXICET) 5-325 MG tablet Take 1-2 tablets by mouth every 6 (six) hours as needed. 20 tablet 0   TESTOSTERONE CYPIONATE IM Inject 100 mg into the muscle every 7 (seven) days.     VITAMIN D PO Take 50 mg by mouth.     Facility-Administered Medications Prior to Visit  Medication Dose Route Frequency Provider Last Rate Last Admin   testosterone cypionate (DEPOTESTOSTERONE CYPIONATE) injection 100 mg  100 mg Intramuscular Weekly Emeterio Reeve, DO   100 mg at 11/23/20 1037    No Known Allergies  ROS Review of Systems    Objective:    Physical Exam  BP 121/71   Pulse 82  Wt Readings from Last 3 Encounters:  11/09/20 194 lb (88 kg)  11/04/20 190 lb 3.2 oz (86.3 kg)  11/02/20 192 lb (87.1 kg)     Health Maintenance Due  Topic Date Due   Pneumococcal Vaccine 40-63 Years old (1 - PCV) Never done   HIV Screening  Never done   Hepatitis C Screening  Never done   COVID-19 Vaccine (3 - Booster for Janssen series) 01/22/2020    There are no preventive care reminders to display for this patient.  No results found for: TSH Lab Results  Component Value Date   WBC 8.0 10/20/2020   HGB 11.8 (L) 10/20/2020   HCT 35.3 (L) 10/20/2020   MCV 91.5 10/20/2020   PLT 287 10/20/2020   Lab Results  Component Value Date   NA 140 10/19/2020   K 3.3 (L) 10/19/2020   CO2 30 06/15/2020   GLUCOSE 168 (H) 10/19/2020   BUN 10 10/19/2020   CREATININE 0.90 10/19/2020   BILITOT 0.8 06/15/2020   AST 18 06/15/2020   ALT 30 (H) 06/15/2020   PROT 6.9 06/15/2020   CALCIUM 9.2 06/15/2020   Lab Results  Component Value Date   CHOL 167 06/15/2020   Lab Results  Component Value Date   HDL 35 (L) 06/15/2020   Lab Results  Component Value Date   LDLCALC 103 (H) 06/15/2020   Lab  Results  Component Value Date   TRIG 169 (H) 06/15/2020   Lab Results  Component Value Date   CHOLHDL 4.8 06/15/2020   No results found for: HGBA1C    Assessment & Plan:  Testosterone injection - Patient tolerated injection well without complications. Patient advised to schedule next injection 7 days from today.    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   Follow-up: Return in about 1 week (around 11/30/2020) for testosterone injection. [name], [name], [place]

## 2020-11-30 ENCOUNTER — Other Ambulatory Visit: Payer: Self-pay

## 2020-11-30 ENCOUNTER — Ambulatory Visit (INDEPENDENT_AMBULATORY_CARE_PROVIDER_SITE_OTHER): Payer: BC Managed Care – PPO | Admitting: Family Medicine

## 2020-11-30 VITALS — BP 125/67 | HR 82

## 2020-11-30 DIAGNOSIS — Z79899 Other long term (current) drug therapy: Secondary | ICD-10-CM

## 2020-11-30 DIAGNOSIS — F640 Transsexualism: Secondary | ICD-10-CM | POA: Diagnosis not present

## 2020-11-30 NOTE — Progress Notes (Signed)
Medical screening examination/treatment was performed by qualified clinical staff member and as supervising provider I was immediately available for consultation/collaboration. I have reviewed documentation and agree with assessment and plan. ? ?Neev Mcmains B Misbah Hornaday, NP ? ?

## 2020-11-30 NOTE — Progress Notes (Signed)
Established Patient Office Visit  Subjective:  Patient ID: Victoria Hill, adult    DOB: [birth year]  Age: 35 y.o. MRN: [id number]  CC:  Chief Complaint  Patient presents with   Injections    HPI Victoria Hill is here for a testosterone injection. Denies chest pain, shortness of breath, headaches or mood changes.    Past Medical History:  Diagnosis Date   Anxiety    COVID toes 2020   BREATHING ISSUES AND PAIN X 2 TO 3 MONTHS ALL ISSUES RESOLVED   Depression    Wears glasses 10/12/2020    Past Surgical History:  Procedure Laterality Date   CHEST SURGERY  2018   double mastectomy   VAGINAL HYSTERECTOMY Bilateral 10/19/2020   Procedure: VAGINAL HYSTERECTOMY  with BSO;  Surgeon: Donnamae Jude, MD;  Location: [facility];  Service: Gynecology;  Laterality: Bilateral;  With BSO add 30 minutes OR time.    History reviewed. No pertinent family history.  Social History   Socioeconomic History   Marital status: Single    Spouse name: Not on file   Number of children: Not on file   Years of education: Not on file   Highest education level: Not on file  Occupational History   Not on file  Tobacco Use   Smoking status: Never   Smokeless tobacco: Never  Vaping Use   Vaping Use: Never used  Substance and Sexual Activity   Alcohol use: Not Currently   Drug use: Never   Sexual activity: Never    Partners: Female  Other Topics Concern   Not on file  Social History Narrative   Not on file   Social Determinants of Health   Financial Resource Strain: Not on file  Food Insecurity: Not on file  Transportation Needs: Not on file  Physical Activity: Not on file  Stress: Not on file  Social Connections: Not on file  Intimate Partner Violence: Not on file    Outpatient Medications Prior to Visit  Medication Sig Dispense Refill   Ascorbic Acid (VITAMIN C PO) Take 100 mg by mouth.     buPROPion (WELLBUTRIN XL) 150 MG 24 hr tablet Take 1 tablet (150 mg total) by  mouth every morning. 90 tablet 3   escitalopram (LEXAPRO) 10 MG tablet Take 1 tablet (10 mg total) by mouth daily. 90 tablet 0   OVER THE COUNTER MEDICATION Zinc po 50 mg daily     oxyCODONE-acetaminophen (PERCOCET/ROXICET) 5-325 MG tablet Take 1-2 tablets by mouth every 6 (six) hours as needed. 20 tablet 0   TESTOSTERONE CYPIONATE IM Inject 100 mg into the muscle every 7 (seven) days.     VITAMIN D PO Take 50 mg by mouth.     Facility-Administered Medications Prior to Visit  Medication Dose Route Frequency Provider Last Rate Last Admin   testosterone cypionate (DEPOTESTOSTERONE CYPIONATE) injection 100 mg  100 mg Intramuscular Weekly Emeterio Reeve, DO   100 mg at 11/30/20 1039    No Known Allergies  ROS Review of Systems    Objective:    Physical Exam  BP 125/67   Pulse 82   SpO2 100%  Wt Readings from Last 3 Encounters:  11/09/20 194 lb (88 kg)  11/04/20 190 lb 3.2 oz (86.3 kg)  11/02/20 192 lb (87.1 kg)     Health Maintenance Due  Topic Date Due   Pneumococcal Vaccine 79-51 Years old (1 - PCV) Never done   HIV Screening  Never done  Hepatitis C Screening  Never done   COVID-19 Vaccine (3 - Booster for Janssen series) 01/22/2020    There are no preventive care reminders to display for this patient.  No results found for: TSH Lab Results  Component Value Date   WBC 8.0 10/20/2020   HGB 11.8 (L) 10/20/2020   HCT 35.3 (L) 10/20/2020   MCV 91.5 10/20/2020   PLT 287 10/20/2020   Lab Results  Component Value Date   NA 140 10/19/2020   K 3.3 (L) 10/19/2020   CO2 30 06/15/2020   GLUCOSE 168 (H) 10/19/2020   BUN 10 10/19/2020   CREATININE 0.90 10/19/2020   BILITOT 0.8 06/15/2020   AST 18 06/15/2020   ALT 30 (H) 06/15/2020   PROT 6.9 06/15/2020   CALCIUM 9.2 06/15/2020   Lab Results  Component Value Date   CHOL 167 06/15/2020   Lab Results  Component Value Date   HDL 35 (L) 06/15/2020   Lab Results  Component Value Date   LDLCALC 103 (H)  06/15/2020   Lab Results  Component Value Date   TRIG 169 (H) 06/15/2020   Lab Results  Component Value Date   CHOLHDL 4.8 06/15/2020   No results found for: HGBA1C    Assessment & Plan:  Injection - Patient tolerated injection well without complications. Patient advised to schedule next injection 7 days from today.    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   Follow-up: Return in about 1 week (around 12/07/2020) for testosterone injection. [name], [name], [place]

## 2020-12-07 ENCOUNTER — Ambulatory Visit (INDEPENDENT_AMBULATORY_CARE_PROVIDER_SITE_OTHER): Payer: BC Managed Care – PPO | Admitting: Sports Medicine

## 2020-12-07 VITALS — BP 127/69 | HR 85

## 2020-12-07 DIAGNOSIS — F640 Transsexualism: Secondary | ICD-10-CM

## 2020-12-07 DIAGNOSIS — Z79899 Other long term (current) drug therapy: Secondary | ICD-10-CM

## 2020-12-07 NOTE — Progress Notes (Signed)
Established Patient Office Visit  Subjective:  Patient ID: Victoria Hill, adult    DOB: 06-21-1985  Age: 35 y.o. MRN: [id number]  CC:  Chief Complaint  Patient presents with   Injections    HPI Victoria Hill is here for a testosterone injection. Denies chest pain, shortness of breath, headaches or mood changes.    Past Medical History:  Diagnosis Date   Anxiety    COVID toes 2020   BREATHING ISSUES AND PAIN X 2 TO 3 MONTHS ALL ISSUES RESOLVED   Depression    Wears glasses 10/12/2020    Past Surgical History:  Procedure Laterality Date   CHEST SURGERY  2018   double mastectomy   VAGINAL HYSTERECTOMY Bilateral 10/19/2020   Procedure: VAGINAL HYSTERECTOMY  with BSO;  Surgeon: Donnamae Jude, MD;  Location: [place];  Service: Gynecology;  Laterality: Bilateral;  With BSO add 30 minutes OR time.    History reviewed. No pertinent family history.  Social History   Socioeconomic History   Marital status: Single    Spouse name: Not on file   Number of children: Not on file   Years of education: Not on file   Highest education level: Not on file  Occupational History   Not on file  Tobacco Use   Smoking status: Never   Smokeless tobacco: Never  Vaping Use   Vaping Use: Never used  Substance and Sexual Activity   Alcohol use: Not Currently   Drug use: Never   Sexual activity: Never    Partners: Female  Other Topics Concern   Not on file  Social History Narrative   Not on file   Social Determinants of Health   Financial Resource Strain: Not on file  Food Insecurity: Not on file  Transportation Needs: Not on file  Physical Activity: Not on file  Stress: Not on file  Social Connections: Not on file  Intimate Partner Violence: Not on file    Outpatient Medications Prior to Visit  Medication Sig Dispense Refill   Ascorbic Acid (VITAMIN C PO) Take 100 mg by mouth.     buPROPion (WELLBUTRIN XL) 150 MG 24 hr tablet Take 1 tablet (150 mg total) by  mouth every morning. 90 tablet 3   escitalopram (LEXAPRO) 10 MG tablet Take 1 tablet (10 mg total) by mouth daily. 90 tablet 0   OVER THE COUNTER MEDICATION Zinc po 50 mg daily     oxyCODONE-acetaminophen (PERCOCET/ROXICET) 5-325 MG tablet Take 1-2 tablets by mouth every 6 (six) hours as needed. 20 tablet 0   TESTOSTERONE CYPIONATE IM Inject 100 mg into the muscle every 7 (seven) days.     VITAMIN D PO Take 50 mg by mouth.     Facility-Administered Medications Prior to Visit  Medication Dose Route Frequency Provider Last Rate Last Admin   testosterone cypionate (DEPOTESTOSTERONE CYPIONATE) injection 100 mg  100 mg Intramuscular Weekly Emeterio Reeve, DO   100 mg at 12/07/20 1019    No Known Allergies  ROS Review of Systems    Objective:    Physical Exam  BP 127/69   Pulse 85   SpO2 100%  Wt Readings from Last 3 Encounters:  11/09/20 194 lb (88 kg)  11/04/20 190 lb 3.2 oz (86.3 kg)  11/02/20 192 lb (87.1 kg)     Health Maintenance Due  Topic Date Due   Pneumococcal Vaccine 17-34 Years old (1 - PCV) Never done   HIV Screening  Never done  Hepatitis C Screening  Never done   COVID-19 Vaccine (3 - Booster for Janssen series) 01/22/2020    There are no preventive care reminders to display for this patient.  No results found for: TSH Lab Results  Component Value Date   WBC 8.0 10/20/2020   HGB 11.8 (L) 10/20/2020   HCT 35.3 (L) 10/20/2020   MCV 91.5 10/20/2020   PLT 287 10/20/2020   Lab Results  Component Value Date   NA 140 10/19/2020   K 3.3 (L) 10/19/2020   CO2 30 06/15/2020   GLUCOSE 168 (H) 10/19/2020   BUN 10 10/19/2020   CREATININE 0.90 10/19/2020   BILITOT 0.8 06/15/2020   AST 18 06/15/2020   ALT 30 (H) 06/15/2020   PROT 6.9 06/15/2020   CALCIUM 9.2 06/15/2020   Lab Results  Component Value Date   CHOL 167 06/15/2020   Lab Results  Component Value Date   HDL 35 (L) 06/15/2020   Lab Results  Component Value Date   LDLCALC 103 (H)  06/15/2020   Lab Results  Component Value Date   TRIG 169 (H) 06/15/2020   Lab Results  Component Value Date   CHOLHDL 4.8 06/15/2020   No results found for: HGBA1C    Assessment & Plan:  Testosterone injection - Patient tolerated injection well without complications. Patient advised to schedule next injection 7 days from today.    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   Follow-up: Return in about 1 week (around 12/14/2020) for Testosterone injection. [name], [name], [place]

## 2020-12-08 ENCOUNTER — Encounter: Payer: Self-pay | Admitting: Family Medicine

## 2020-12-08 ENCOUNTER — Other Ambulatory Visit: Payer: Self-pay

## 2020-12-08 ENCOUNTER — Telehealth: Payer: Self-pay | Admitting: *Deleted

## 2020-12-08 ENCOUNTER — Telehealth (INDEPENDENT_AMBULATORY_CARE_PROVIDER_SITE_OTHER): Payer: BC Managed Care – PPO | Admitting: Family Medicine

## 2020-12-08 DIAGNOSIS — Z09 Encounter for follow-up examination after completed treatment for conditions other than malignant neoplasm: Secondary | ICD-10-CM

## 2020-12-08 NOTE — Progress Notes (Signed)
GYNECOLOGY VIRTUAL VISIT ENCOUNTER NOTE  Provider location: [facility] [place] [facility]   Patient location: Home  I connected with Victoria Hill on 12/08/20 at  1:30 PM EST by MyChart Video Encounter and verified that I am speaking with the correct person using two identifiers.   I discussed the limitations, risks, security and privacy concerns of performing an evaluation and management service virtually and the availability of in person appointments. I also discussed with the patient that there may be a patient responsible charge related to this service. The patient expressed understanding and agreed to proceed.   History:  Victoria Hill is a 35 y.o. G0P0000 female being evaluated today for post op check. He is recovered from TVH/BSO on 10/19/2020. He reports a twinge of pain occasionally but his pain that was there prior to removal of pelvic organs is improved and in fact, gone. He has no issues with bowel or bladder symptoms. He denies any abnormal vaginal discharge, bleeding or other concerns.       Past Medical History:  Diagnosis Date   Anxiety    COVID toes 2020   BREATHING ISSUES AND PAIN X 2 TO 3 MONTHS ALL ISSUES RESOLVED   Depression    Wears glasses 10/12/2020   Past Surgical History:  Procedure Laterality Date   CHEST SURGERY  2018   double mastectomy   VAGINAL HYSTERECTOMY Bilateral 10/19/2020   Procedure: VAGINAL HYSTERECTOMY  with BSO;  Surgeon: Donnamae Jude, MD;  Location: [place];  Service: Gynecology;  Laterality: Bilateral;  With BSO add 30 minutes OR time.   The following portions of the patient's history were reviewed and updated as appropriate: allergies, current medications, past family history, past medical history, past social history, past surgical history and problem list.    Review of Systems:  Pertinent items noted in HPI and remainder of comprehensive ROS otherwise negative.  Physical Exam:   General:  Alert, oriented  and cooperative. Patient appears to be in no acute distress.  Mental Status: Normal mood and affect. Normal behavior. Normal judgment and thought content.   Respiratory: Normal respiratory effort, no problems with respiration noted  Rest of physical exam deferred due to type of encounter  Labs and Imaging No results found for this or any previous visit (from the past 336 hour(s)). No results found.     Assessment and Plan:     Postop check - Doing well. Release back to work on 12/13/2020.       I discussed the assessment and treatment plan with the patient. The patient was provided an opportunity to ask questions and all were answered. The patient agreed with the plan and demonstrated an understanding of the instructions.   The patient was advised to call back or seek an in-person evaluation/go to the ED if the symptoms worsen or if the condition fails to improve as anticipated.  I provided 7 minutes of face-to-face time during this encounter.   Donnamae Jude, MD [place]

## 2020-12-08 NOTE — Telephone Encounter (Signed)
Faxed return to work note to [phone]. Fax was successful.

## 2020-12-08 NOTE — Progress Notes (Signed)
I connected with  Carman Ching on 12/08/20 at  1:30 PM EST by telephone and verified that I am speaking with the correct person using two identifiers.   I discussed the limitations, risks, security and privacy concerns of performing an evaluation and management service by telephone and the availability of in person appointments. I also discussed with the patient that there may be a patient responsible charge related to this service. The patient expressed understanding and agreed to proceed.  Crosby Oyster, RN 12/08/2020  1:34 PM

## 2020-12-09 ENCOUNTER — Encounter: Payer: Self-pay | Admitting: *Deleted

## 2020-12-14 ENCOUNTER — Ambulatory Visit (INDEPENDENT_AMBULATORY_CARE_PROVIDER_SITE_OTHER): Payer: BC Managed Care – PPO | Admitting: Physician Assistant

## 2020-12-14 ENCOUNTER — Other Ambulatory Visit: Payer: Self-pay

## 2020-12-14 VITALS — BP 104/70 | HR 84 | Temp 98.0°F | Wt 197.1 lb

## 2020-12-14 DIAGNOSIS — Z79899 Other long term (current) drug therapy: Secondary | ICD-10-CM | POA: Diagnosis not present

## 2020-12-14 DIAGNOSIS — F640 Transsexualism: Secondary | ICD-10-CM | POA: Diagnosis not present

## 2020-12-14 MED ORDER — TESTOSTERONE CYPIONATE 200 MG/ML IM SOLN
100.0000 mg | Freq: Once | INTRAMUSCULAR | Status: AC
  Administered 2020-12-14: 100 mg via INTRAMUSCULAR

## 2020-12-14 NOTE — Progress Notes (Signed)
Agree with above plan. 

## 2020-12-14 NOTE — Progress Notes (Signed)
Patient is here for a testosterone injection 100 mg/ml. Denies chest pains, palpitations, shortness of breath, headaches and problems with medication or mood changes.   Patient tolerated injection well without complications on the LUOQ. Patient advised to schedule next injection in 1 week.

## 2020-12-21 ENCOUNTER — Ambulatory Visit (INDEPENDENT_AMBULATORY_CARE_PROVIDER_SITE_OTHER): Payer: BC Managed Care – PPO | Admitting: Family Medicine

## 2020-12-21 ENCOUNTER — Other Ambulatory Visit: Payer: Self-pay

## 2020-12-21 VITALS — BP 117/68 | HR 78

## 2020-12-21 DIAGNOSIS — F640 Transsexualism: Secondary | ICD-10-CM

## 2020-12-21 DIAGNOSIS — Z79899 Other long term (current) drug therapy: Secondary | ICD-10-CM | POA: Diagnosis not present

## 2020-12-21 NOTE — Progress Notes (Signed)
Medical screening examination/treatment was performed by qualified clinical staff member and as supervising physician I was immediately available for consultation/collaboration. I have reviewed documentation and agree with assessment and plan.  Toan Mort, DO  

## 2020-12-21 NOTE — Progress Notes (Signed)
Established Patient Office Visit  Subjective:  Patient ID: Victoria Hill, adult    DOB: [birth year]  Age: 35 y.o. MRN: [id number]  CC:  Chief Complaint  Patient presents with   Injections    HPI Miriya Cloer is here for a testosterone injection. Denies chest pain, shortness of breath, headaches or mood changes.    Past Medical History:  Diagnosis Date   Anxiety    COVID toes 2020   BREATHING ISSUES AND PAIN X 2 TO 3 MONTHS ALL ISSUES RESOLVED   Depression    Wears glasses 10/12/2020    Past Surgical History:  Procedure Laterality Date   CHEST SURGERY  2018   double mastectomy   VAGINAL HYSTERECTOMY Bilateral 10/19/2020   Procedure: VAGINAL HYSTERECTOMY  with BSO;  Surgeon: Donnamae Jude, MD;  Location: [place];  Service: Gynecology;  Laterality: Bilateral;  With BSO add 30 minutes OR time.    History reviewed. No pertinent family history.  Social History   Socioeconomic History   Marital status: Single    Spouse name: Not on file   Number of children: Not on file   Years of education: Not on file   Highest education level: Not on file  Occupational History   Not on file  Tobacco Use   Smoking status: Never   Smokeless tobacco: Never  Vaping Use   Vaping Use: Never used  Substance and Sexual Activity   Alcohol use: Not Currently   Drug use: Never   Sexual activity: Never    Partners: Female  Other Topics Concern   Not on file  Social History Narrative   Not on file   Social Determinants of Health   Financial Resource Strain: Not on file  Food Insecurity: Not on file  Transportation Needs: Not on file  Physical Activity: Not on file  Stress: Not on file  Social Connections: Not on file  Intimate Partner Violence: Not on file    Outpatient Medications Prior to Visit  Medication Sig Dispense Refill   Ascorbic Acid (VITAMIN C PO) Take 100 mg by mouth.     buPROPion (WELLBUTRIN XL) 150 MG 24 hr tablet Take 1 tablet (150 mg total) by  mouth every morning. 90 tablet 3   escitalopram (LEXAPRO) 10 MG tablet Take 1 tablet (10 mg total) by mouth daily. 90 tablet 0   OVER THE COUNTER MEDICATION Zinc po 50 mg daily     TESTOSTERONE CYPIONATE IM Inject 100 mg into the muscle every 7 (seven) days.     VITAMIN D PO Take 50 mg by mouth.     Facility-Administered Medications Prior to Visit  Medication Dose Route Frequency Provider Last Rate Last Admin   testosterone cypionate (DEPOTESTOSTERONE CYPIONATE) injection 100 mg  100 mg Intramuscular Weekly Emeterio Reeve, DO   100 mg at 12/21/20 1032    No Known Allergies  ROS Review of Systems    Objective:    Physical Exam  BP 117/68   Pulse 78   SpO2 100%  Wt Readings from Last 3 Encounters:  12/14/20 197 lb 1.9 oz (89.4 kg)  11/09/20 194 lb (88 kg)  11/04/20 190 lb 3.2 oz (86.3 kg)     Health Maintenance Due  Topic Date Due   Pneumococcal Vaccine 25-46 Years old (1 - PCV) Never done   HIV Screening  Never done   Hepatitis C Screening  Never done   COVID-19 Vaccine (3 - Booster for Janssen series) 01/22/2020  There are no preventive care reminders to display for this patient.  No results found for: TSH Lab Results  Component Value Date   WBC 8.0 10/20/2020   HGB 11.8 (L) 10/20/2020   HCT 35.3 (L) 10/20/2020   MCV 91.5 10/20/2020   PLT 287 10/20/2020   Lab Results  Component Value Date   NA 140 10/19/2020   K 3.3 (L) 10/19/2020   CO2 30 06/15/2020   GLUCOSE 168 (H) 10/19/2020   BUN 10 10/19/2020   CREATININE 0.90 10/19/2020   BILITOT 0.8 06/15/2020   AST 18 06/15/2020   ALT 30 (H) 06/15/2020   PROT 6.9 06/15/2020   CALCIUM 9.2 06/15/2020   Lab Results  Component Value Date   CHOL 167 06/15/2020   Lab Results  Component Value Date   HDL 35 (L) 06/15/2020   Lab Results  Component Value Date   LDLCALC 103 (H) 06/15/2020   Lab Results  Component Value Date   TRIG 169 (H) 06/15/2020   Lab Results  Component Value Date   CHOLHDL 4.8  06/15/2020   No results found for: HGBA1C    Assessment & Plan:  Testosterone injection - Patient tolerated injection well without complications. Patient advised to schedule next injection 7 days from today.     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   Follow-up: Return in about 1 week (around 12/28/2020) for testosterone injection. [name], [name], [place]

## 2020-12-28 ENCOUNTER — Ambulatory Visit (INDEPENDENT_AMBULATORY_CARE_PROVIDER_SITE_OTHER): Payer: BC Managed Care – PPO | Admitting: Family Medicine

## 2020-12-28 ENCOUNTER — Other Ambulatory Visit: Payer: Self-pay

## 2020-12-28 DIAGNOSIS — Z789 Other specified health status: Secondary | ICD-10-CM

## 2020-12-28 NOTE — Progress Notes (Signed)
Agree with documentation as above.   Bryah Ocheltree, MD  

## 2020-12-28 NOTE — Progress Notes (Signed)
Established Patient Office Visit  Subjective:  Patient ID: Victoria Hill, adult    DOB: 04-11-1985  Age: 35 y.o. MRN: [id number]  CC:  Chief Complaint  Patient presents with   Injections    HPI Victoria Hill is here for a testosterone injection. Denies chest pain, shortness of breath, headaches or mood changes.    Past Medical History:  Diagnosis Date   Anxiety    COVID toes 2020   BREATHING ISSUES AND PAIN X 2 TO 3 MONTHS ALL ISSUES RESOLVED   Depression    Wears glasses 10/12/2020    Past Surgical History:  Procedure Laterality Date   CHEST SURGERY  2018   double mastectomy   VAGINAL HYSTERECTOMY Bilateral 10/19/2020   Procedure: VAGINAL HYSTERECTOMY  with BSO;  Surgeon: Donnamae Jude, MD;  Location: [place];  Service: Gynecology;  Laterality: Bilateral;  With BSO add 30 minutes OR time.    History reviewed. No pertinent family history.  Social History   Socioeconomic History   Marital status: Single    Spouse name: Not on file   Number of children: Not on file   Years of education: Not on file   Highest education level: Not on file  Occupational History   Not on file  Tobacco Use   Smoking status: Never   Smokeless tobacco: Never  Vaping Use   Vaping Use: Never used  Substance and Sexual Activity   Alcohol use: Not Currently   Drug use: Never   Sexual activity: Never    Partners: Female  Other Topics Concern   Not on file  Social History Narrative   Not on file   Social Determinants of Health   Financial Resource Strain: Not on file  Food Insecurity: Not on file  Transportation Needs: Not on file  Physical Activity: Not on file  Stress: Not on file  Social Connections: Not on file  Intimate Partner Violence: Not on file    Outpatient Medications Prior to Visit  Medication Sig Dispense Refill   Ascorbic Acid (VITAMIN C PO) Take 100 mg by mouth.     buPROPion (WELLBUTRIN XL) 150 MG 24 hr tablet Take 1 tablet (150 mg total) by  mouth every morning. 90 tablet 3   escitalopram (LEXAPRO) 10 MG tablet Take 1 tablet (10 mg total) by mouth daily. 90 tablet 0   OVER THE COUNTER MEDICATION Zinc po 50 mg daily     TESTOSTERONE CYPIONATE IM Inject 100 mg into the muscle every 7 (seven) days.     VITAMIN D PO Take 50 mg by mouth.     Facility-Administered Medications Prior to Visit  Medication Dose Route Frequency Provider Last Rate Last Admin   testosterone cypionate (DEPOTESTOSTERONE CYPIONATE) injection 100 mg  100 mg Intramuscular Weekly Emeterio Reeve, DO   100 mg at 12/28/20 1022    No Known Allergies  ROS Review of Systems    Objective:    Physical Exam  BP 118/71   Pulse 72   SpO2 100%  Wt Readings from Last 3 Encounters:  12/14/20 197 lb 1.9 oz (89.4 kg)  11/09/20 194 lb (88 kg)  11/04/20 190 lb 3.2 oz (86.3 kg)     Health Maintenance Due  Topic Date Due   Pneumococcal Vaccine 18-84 Years old (1 - PCV) Never done   HIV Screening  Never done   Hepatitis C Screening  Never done   COVID-19 Vaccine (3 - Booster for Janssen series) 01/22/2020  There are no preventive care reminders to display for this patient.  No results found for: TSH Lab Results  Component Value Date   WBC 8.0 10/20/2020   HGB 11.8 (L) 10/20/2020   HCT 35.3 (L) 10/20/2020   MCV 91.5 10/20/2020   PLT 287 10/20/2020   Lab Results  Component Value Date   NA 140 10/19/2020   K 3.3 (L) 10/19/2020   CO2 30 06/15/2020   GLUCOSE 168 (H) 10/19/2020   BUN 10 10/19/2020   CREATININE 0.90 10/19/2020   BILITOT 0.8 06/15/2020   AST 18 06/15/2020   ALT 30 (H) 06/15/2020   PROT 6.9 06/15/2020   CALCIUM 9.2 06/15/2020   Lab Results  Component Value Date   CHOL 167 06/15/2020   Lab Results  Component Value Date   HDL 35 (L) 06/15/2020   Lab Results  Component Value Date   LDLCALC 103 (H) 06/15/2020   Lab Results  Component Value Date   TRIG 169 (H) 06/15/2020   Lab Results  Component Value Date   CHOLHDL 4.8  06/15/2020   No results found for: HGBA1C    Assessment & Plan:  Testosterone injection - Patient tolerated injection well without complications. Patient advised to schedule next injection 7 days from today.    Problem List Items Addressed This Visit   None Visit Diagnoses     Female-to-female transgender person           No orders of the defined types were placed in this encounter.   Follow-up: Return in about 1 week (around 01/04/2021) for testosterone injection. Victoria Hill, Victoria Hill, [place]

## 2021-01-03 ENCOUNTER — Other Ambulatory Visit: Payer: Self-pay | Admitting: Osteopathic Medicine

## 2021-01-04 ENCOUNTER — Encounter: Payer: Self-pay | Admitting: Medical-Surgical

## 2021-01-04 ENCOUNTER — Ambulatory Visit: Payer: BC Managed Care – PPO

## 2021-01-04 ENCOUNTER — Other Ambulatory Visit: Payer: Self-pay

## 2021-01-04 ENCOUNTER — Ambulatory Visit: Payer: BC Managed Care – PPO | Admitting: Medical-Surgical

## 2021-01-04 VITALS — BP 114/74 | HR 86 | Resp 20 | Ht 65.0 in | Wt 198.0 lb

## 2021-01-04 DIAGNOSIS — Z789 Other specified health status: Secondary | ICD-10-CM | POA: Diagnosis not present

## 2021-01-04 DIAGNOSIS — F411 Generalized anxiety disorder: Secondary | ICD-10-CM

## 2021-01-04 DIAGNOSIS — F419 Anxiety disorder, unspecified: Secondary | ICD-10-CM

## 2021-01-04 DIAGNOSIS — F32A Depression, unspecified: Secondary | ICD-10-CM | POA: Diagnosis not present

## 2021-01-04 MED ORDER — BUPROPION HCL ER (XL) 150 MG PO TB24: 150.0000 mg | ORAL_TABLET | ORAL | 3 refills | Status: DC

## 2021-01-04 MED ORDER — ESCITALOPRAM OXALATE 10 MG PO TABS: 10.0000 mg | ORAL_TABLET | Freq: Every day | ORAL | 1 refills | Status: DC

## 2021-01-04 MED ORDER — ESCITALOPRAM OXALATE 5 MG PO TABS: 5.0000 mg | ORAL_TABLET | Freq: Every day | ORAL | 1 refills | Status: DC

## 2021-01-04 NOTE — Progress Notes (Signed)
  HPI with pertinent ROS:   CC: Transfer of care, T shot  HPI: Pleasant 35 year old female to female transgender patient presenting today to transfer care to a new PCP.  He has been treated for a number of years with IM testosterone.  Reports weekly to get his injections of 100 mg, very reliable without missing doses.   Anxiety/depression-taking Lexapro 10 mg daily, tolerating well without side effects.  Also taking Wellbutrin 150 mg daily.  Has been on these medications long-term but notes that he does have some issues with highs and lows whenever stressful situations come.  Would like to consider increasing the Lexapro if possible to see if this will help settle out the highs and lows.  Has never taken a higher dose of the Lexapro before.  Denies SI/HI.  I reviewed the past medical history, family history, social history, surgical history, and allergies today and no changes were needed.  Please see the problem list section below in epic for further details.   Physical exam:   General: Well Developed, well nourished, and in no acute distress.  Neuro: Alert and oriented x3. HEENT: Normocephalic, atraumatic. Skin: Warm and dry. Cardiac: Regular rate and rhythm, no murmurs rubs or gallops, no lower extremity edema.  Respiratory: Clear to auscultation bilaterally. Not using accessory muscles, speaking in full sentences.  Impression and Recommendations:    1. Female-to-female transgender person Continue testosterone cypionate 100 mg IM weekly.  Scheduled next visit for 1 week from today for T shot.  Will be due for labs with next visit.  2. Generalized anxiety disorder 3. Anxiety and depression Increasing Lexapro to 15 mg daily.  Continue Wellbutrin at 150 mg daily.  Return in about 4 weeks (around 02/01/2021) for mood follow up (virtual is fine). ___________________________________________ Clearnce Sorrel, DNP, APRN, FNP-BC Primary Care and Sports Medicine [place]

## 2021-01-11 ENCOUNTER — Ambulatory Visit (INDEPENDENT_AMBULATORY_CARE_PROVIDER_SITE_OTHER): Payer: BC Managed Care – PPO | Admitting: Family Medicine

## 2021-01-11 ENCOUNTER — Other Ambulatory Visit: Payer: Self-pay

## 2021-01-11 VITALS — BP 121/79 | HR 71 | Temp 98.0°F | Ht 65.0 in | Wt 197.0 lb

## 2021-01-11 DIAGNOSIS — Z789 Other specified health status: Secondary | ICD-10-CM

## 2021-01-11 MED ORDER — TESTOSTERONE CYPIONATE 200 MG/ML IM SOLN: 100.0000 mg | Freq: Once | INTRAMUSCULAR | Status: DC

## 2021-01-11 NOTE — Progress Notes (Signed)
Medical screening examination/treatment was performed by qualified clinical staff member and as supervising provider I was immediately available for consultation/collaboration. I have reviewed documentation and agree with assessment and plan. ? ?Paublo Warshawsky B Valynn Schamberger, NP ? ?

## 2021-01-11 NOTE — Progress Notes (Signed)
Patient is here for a testosterone injection of 176ml.  Given in [place].  Denies chest pain, shortness of breath, headaches and problems with medication or mood changes.  Tolerated injection well without complications.   Pt to schedule weekly injections.

## 2021-01-18 ENCOUNTER — Ambulatory Visit (INDEPENDENT_AMBULATORY_CARE_PROVIDER_SITE_OTHER): Payer: BC Managed Care – PPO | Admitting: Medical-Surgical

## 2021-01-18 ENCOUNTER — Other Ambulatory Visit: Payer: Self-pay

## 2021-01-18 DIAGNOSIS — F640 Transsexualism: Secondary | ICD-10-CM

## 2021-01-18 DIAGNOSIS — Z79899 Other long term (current) drug therapy: Secondary | ICD-10-CM

## 2021-01-18 NOTE — Progress Notes (Signed)
Agree with above.  ___________________________________________ Clearnce Sorrel, DNP, APRN, FNP-BC Primary Care and [place]

## 2021-01-18 NOTE — Progress Notes (Signed)
Established Patient Office Visit  Subjective:  Patient ID: Victoria Hill, adult    DOB: 28-Nov-1985  Age: 35 y.o. MRN: [id number]  CC:  Chief Complaint  Patient presents with   Injections    HPI Victoria Hill is here for a testosterone injection. Denies chest pain, shortness of breath, headaches or mood changes.    Past Medical History:  Diagnosis Date   Anxiety    COVID toes 2020   BREATHING ISSUES AND PAIN X 2 TO 3 MONTHS ALL ISSUES RESOLVED   Depression    Wears glasses 10/12/2020    Past Surgical History:  Procedure Laterality Date   CHEST SURGERY  2018   double mastectomy   VAGINAL HYSTERECTOMY Bilateral 10/19/2020   Procedure: VAGINAL HYSTERECTOMY  with BSO;  Surgeon: Donnamae Jude, MD;  Location: [facility];  Service: Gynecology;  Laterality: Bilateral;  With BSO add 30 minutes OR time.    History reviewed. No pertinent family history.  Social History   Socioeconomic History   Marital status: Single    Spouse name: Not on file   Number of children: Not on file   Years of education: Not on file   Highest education level: Not on file  Occupational History   Not on file  Tobacco Use   Smoking status: Never   Smokeless tobacco: Never  Vaping Use   Vaping Use: Never used  Substance and Sexual Activity   Alcohol use: Not Currently   Drug use: Never   Sexual activity: Never    Partners: Female  Other Topics Concern   Not on file  Social History Narrative   Not on file   Social Determinants of Health   Financial Resource Strain: Not on file  Food Insecurity: Not on file  Transportation Needs: Not on file  Physical Activity: Not on file  Stress: Not on file  Social Connections: Not on file  Intimate Partner Violence: Not on file    Outpatient Medications Prior to Visit  Medication Sig Dispense Refill   Ascorbic Acid (VITAMIN C PO) Take 100 mg by mouth.     buPROPion (WELLBUTRIN XL) 150 MG 24 hr tablet Take 1 tablet (150 mg total) by  mouth every morning. 90 tablet 3   escitalopram (LEXAPRO) 10 MG tablet Take 1 tablet (10 mg total) by mouth daily. Take with 5mg  dose to equal 15mg  daily. 90 tablet 1   escitalopram (LEXAPRO) 5 MG tablet Take 1 tablet (5 mg total) by mouth daily. Take with 10mg  dose to equal 15mg  daily. 90 tablet 1   OVER THE COUNTER MEDICATION Zinc po 50 mg daily     TESTOSTERONE CYPIONATE IM Inject 100 mg into the muscle every 7 (seven) days.     VITAMIN D PO Take 50 mg by mouth.     Facility-Administered Medications Prior to Visit  Medication Dose Route Frequency Provider Last Rate Last Admin   testosterone cypionate (DEPOTESTOSTERONE CYPIONATE) injection 100 mg  100 mg Intramuscular Weekly Emeterio Reeve, DO   100 mg at 01/18/21 1022    No Known Allergies  ROS Review of Systems    Objective:    Physical Exam  BP 121/72    Pulse 84    SpO2 100%  Wt Readings from Last 3 Encounters:  01/11/21 197 lb (89.4 kg)  01/04/21 198 lb (89.8 kg)  12/14/20 197 lb 1.9 oz (89.4 kg)     Health Maintenance Due  Topic Date Due   HIV Screening  Never done   Hepatitis C Screening  Never done    There are no preventive care reminders to display for this patient.  No results found for: TSH Lab Results  Component Value Date   WBC 8.0 10/20/2020   HGB 11.8 (L) 10/20/2020   HCT 35.3 (L) 10/20/2020   MCV 91.5 10/20/2020   PLT 287 10/20/2020   Lab Results  Component Value Date   NA 140 10/19/2020   K 3.3 (L) 10/19/2020   CO2 30 06/15/2020   GLUCOSE 168 (H) 10/19/2020   BUN 10 10/19/2020   CREATININE 0.90 10/19/2020   BILITOT 0.8 06/15/2020   AST 18 06/15/2020   ALT 30 (H) 06/15/2020   PROT 6.9 06/15/2020   CALCIUM 9.2 06/15/2020   Lab Results  Component Value Date   CHOL 167 06/15/2020   Lab Results  Component Value Date   HDL 35 (L) 06/15/2020   Lab Results  Component Value Date   LDLCALC 103 (H) 06/15/2020   Lab Results  Component Value Date   TRIG 169 (H) 06/15/2020   Lab  Results  Component Value Date   CHOLHDL 4.8 06/15/2020   No results found for: HGBA1C    Assessment & Plan:  Testosterone injection - Patient tolerated injection well without complications. Patient advised to schedule next injection 7 days from today.     Problem List Items Addressed This Visit   None Visit Diagnoses     Transgender person on hormone therapy           No orders of the defined types were placed in this encounter.   Follow-up: Return in about 1 week (around 01/25/2021) for testosterone injection. [name], [name], [place]

## 2021-01-25 ENCOUNTER — Other Ambulatory Visit: Payer: Self-pay

## 2021-01-25 ENCOUNTER — Ambulatory Visit (INDEPENDENT_AMBULATORY_CARE_PROVIDER_SITE_OTHER): Payer: BC Managed Care – PPO | Admitting: Medical-Surgical

## 2021-01-25 VITALS — BP 129/76 | HR 80

## 2021-01-25 DIAGNOSIS — F640 Transsexualism: Secondary | ICD-10-CM | POA: Diagnosis not present

## 2021-01-25 DIAGNOSIS — Z789 Other specified health status: Secondary | ICD-10-CM

## 2021-01-25 DIAGNOSIS — Z79899 Other long term (current) drug therapy: Secondary | ICD-10-CM

## 2021-01-25 MED ORDER — TESTOSTERONE CYPIONATE 200 MG/ML IM SOLN
100.0000 mg | Freq: Once | INTRAMUSCULAR | Status: AC
  Administered 2021-02-22: 10:00:00 100 mg via INTRAMUSCULAR

## 2021-01-25 NOTE — Progress Notes (Signed)
Agree with above. Continue weekly injections and plan to follow up with PCP as scheduled on 02/08/2021.  ___________________________________________ Clearnce Sorrel, DNP, APRN, FNP-BC [facility] [place]

## 2021-01-25 NOTE — Progress Notes (Signed)
HPI:  Patient is here for a testosterone 100mg  injection.  Denies chest pains, shortness of breath, headaches and problems with medication or mood changes.  Assessment and Plan:  Injection administered in LUOQ.  Patient tolerated injection well without complications.  Patient advised to schedule next injection in 7 days.  Charyl Bigger, CMA

## 2021-02-03 ENCOUNTER — Other Ambulatory Visit: Payer: Self-pay

## 2021-02-03 ENCOUNTER — Ambulatory Visit: Payer: BC Managed Care – PPO | Admitting: Medical-Surgical

## 2021-02-03 VITALS — BP 150/74 | HR 79 | Ht 66.0 in | Wt 197.0 lb

## 2021-02-03 DIAGNOSIS — Z79899 Other long term (current) drug therapy: Secondary | ICD-10-CM | POA: Diagnosis not present

## 2021-02-03 DIAGNOSIS — F640 Transsexualism: Secondary | ICD-10-CM | POA: Diagnosis not present

## 2021-02-03 MED ORDER — TESTOSTERONE CYPIONATE 200 MG/ML IM SOLN
100.0000 mg | Freq: Once | INTRAMUSCULAR | Status: AC
  Administered 2021-02-03: 100 mg via INTRAMUSCULAR

## 2021-02-03 NOTE — Progress Notes (Signed)
Agree with documentation as above.  ? ?___________________________________________ ?Vinita Prentiss L. Plumer Mittelstaedt, DNP, APRN, FNP-BC ?Primary Care and Sports Medicine ?[facility] ? ?

## 2021-02-03 NOTE — Progress Notes (Signed)
Patient is here for testosterone injection. Denies chest pain, shortness of breath, headaches, and problems with medication or mood changes.   Patient tolerated testosterone 100 mg injection to RUOQ well without complications. Patient advised to schedule next injection in 7 days.

## 2021-02-08 ENCOUNTER — Other Ambulatory Visit: Payer: Self-pay

## 2021-02-08 ENCOUNTER — Encounter: Payer: Self-pay | Admitting: Medical-Surgical

## 2021-02-08 ENCOUNTER — Ambulatory Visit: Payer: BC Managed Care – PPO | Admitting: Medical-Surgical

## 2021-02-08 VITALS — BP 125/83 | HR 81 | Resp 20 | Ht 66.0 in | Wt 197.0 lb

## 2021-02-08 DIAGNOSIS — F419 Anxiety disorder, unspecified: Secondary | ICD-10-CM

## 2021-02-08 DIAGNOSIS — F411 Generalized anxiety disorder: Secondary | ICD-10-CM | POA: Diagnosis not present

## 2021-02-08 DIAGNOSIS — Z79899 Other long term (current) drug therapy: Secondary | ICD-10-CM | POA: Diagnosis not present

## 2021-02-08 DIAGNOSIS — F640 Transsexualism: Secondary | ICD-10-CM

## 2021-02-08 DIAGNOSIS — F32A Depression, unspecified: Secondary | ICD-10-CM

## 2021-02-08 MED ORDER — ESCITALOPRAM OXALATE 10 MG PO TABS: 10.0000 mg | ORAL_TABLET | Freq: Every day | ORAL | 1 refills | Status: DC

## 2021-02-08 MED ORDER — ESCITALOPRAM OXALATE 5 MG PO TABS: 5.0000 mg | ORAL_TABLET | Freq: Every day | ORAL | 1 refills | Status: DC

## 2021-02-08 NOTE — Progress Notes (Addendum)
° °  Established patient office visit  HPI with pertinent ROS:   CC: Mood follow up  HPI: Victoria Hill is a pleasant 36 y.o. female here for a follow up on mood. Doing well on Lexapro 15 mg and Wellbutrin 150mg  . Not currently seeing a counselor. Denies SI/HI. No concerns at this time.    I reviewed the past medical history, family history, social history, surgical history, and allergies today and no changes were needed.  Please see the problem list section below in epic for further details.   Brief exam, Assessment, and Plan:   Today's Vitals: BP 125/83 (BP Location: Left Arm, Patient Position: Sitting, Cuff Size: Normal)    Pulse 81    Resp 20    Ht 5\' 6"  (1.676 m)    Wt 197 lb (89.4 kg)    SpO2 98%    BMI 31.80 kg/m  1. 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 Receiving injections routinely. Checking routine labs today as stated below. - CBC - COMPLETE METABOLIC PANEL WITH GFR - Lipid panel - Testosterone  2. Anxiety and depression 3. Generalized anxiety disorder Taking Wellbutrin and Lexapro as prescribed daily. No side affects or concerns noted.      Return for labs on Friday morning and next T shot in 1 week.  Jeanann Lewandowsky , Student-NP ___________________________________________ Clearnce Sorrel, DNP, APRN, FNP-BC Primary Care and Sports [facility] [place]

## 2021-02-11 DIAGNOSIS — Z79899 Other long term (current) drug therapy: Secondary | ICD-10-CM | POA: Diagnosis not present

## 2021-02-11 DIAGNOSIS — F640 Transsexualism: Secondary | ICD-10-CM | POA: Diagnosis not present

## 2021-02-15 ENCOUNTER — Ambulatory Visit (INDEPENDENT_AMBULATORY_CARE_PROVIDER_SITE_OTHER): Payer: BC Managed Care – PPO | Admitting: Medical-Surgical

## 2021-02-15 ENCOUNTER — Other Ambulatory Visit: Payer: Self-pay

## 2021-02-15 VITALS — BP 118/72 | HR 84

## 2021-02-15 DIAGNOSIS — Z789 Other specified health status: Secondary | ICD-10-CM

## 2021-02-15 MED ORDER — TESTOSTERONE CYPIONATE 200 MG/ML IM SOLN
100.0000 mg | Freq: Once | INTRAMUSCULAR | Status: AC
  Administered 2021-02-15: 100 mg via INTRAMUSCULAR

## 2021-02-15 NOTE — Progress Notes (Signed)
Agree with documentation as above.  ? ?___________________________________________ ?Janecia Palau L. Giani Winther, DNP, APRN, FNP-BC ?Primary Care and Sports Medicine ?[facility] ? ?

## 2021-02-15 NOTE — Progress Notes (Signed)
HPI:  Patient is here for a testosterone injection.  Denies chest pains, shortness of breath, headaches and problems with medication or mood changes.  Assessment and Plan: Injection administered RUOQ.  Patient tolerated injection well without complications.  Patient advised to schedule next injection in 14 days.  Charyl Bigger, CMA (AAMA)

## 2021-02-16 LAB — COMPLETE METABOLIC PANEL WITH GFR
AG Ratio: 1.6 (calc) (ref 1.0–2.5)
ALT: 23 U/L (ref 6–29)
AST: 17 U/L (ref 10–30)
Albumin: 4.3 g/dL (ref 3.6–5.1)
Alkaline phosphatase (APISO): 49 U/L (ref 31–125)
BUN/Creatinine Ratio: 11 (calc) (ref 6–22)
BUN: 12 mg/dL (ref 7–25)
CO2: 31 mmol/L (ref 20–32)
Calcium: 9.5 mg/dL (ref 8.6–10.2)
Chloride: 104 mmol/L (ref 98–110)
Creat: 1.06 mg/dL — ABNORMAL HIGH (ref 0.50–0.97)
Globulin: 2.7 g/dL (calc) (ref 1.9–3.7)
Glucose, Bld: 81 mg/dL (ref 65–99)
Potassium: 3.9 mmol/L (ref 3.5–5.3)
Sodium: 142 mmol/L (ref 135–146)
Total Bilirubin: 0.8 mg/dL (ref 0.2–1.2)
Total Protein: 7 g/dL (ref 6.1–8.1)
eGFR: 69 mL/min/{1.73_m2} (ref >=60)

## 2021-02-16 LAB — CBC
HCT: 46 % — ABNORMAL HIGH (ref 35.0–45.0)
Hemoglobin: 15.7 g/dL — ABNORMAL HIGH (ref 11.7–15.5)
MCH: 29.9 pg (ref 27.0–33.0)
MCHC: 34.1 g/dL (ref 32.0–36.0)
MCV: 87.6 fL (ref 80.0–100.0)
MPV: 10.5 fL (ref 7.5–12.5)
Platelets: 349 10*3/uL (ref 140–400)
RBC: 5.25 10*6/uL — ABNORMAL HIGH (ref 3.80–5.10)
RDW: 13.8 % (ref 11.0–15.0)
WBC: 6.8 10*3/uL (ref 3.8–10.8)

## 2021-02-16 LAB — LIPID PANEL
Cholesterol: 191 mg/dL (ref <200)
HDL: 40 mg/dL — ABNORMAL LOW (ref >=50)
LDL Cholesterol (Calc): 126 mg/dL (calc) — ABNORMAL HIGH
Non-HDL Cholesterol (Calc): 151 mg/dL (calc) — ABNORMAL HIGH (ref <130)
Total CHOL/HDL Ratio: 4.8 (calc) (ref <5.0)
Triglycerides: 130 mg/dL (ref <150)

## 2021-02-16 LAB — TESTOSTERONE, TOTAL, LC/MS/MS: Testosterone, Total, LC-MS-MS: 826 ng/dL — ABNORMAL HIGH (ref 2–45)

## 2021-02-22 ENCOUNTER — Ambulatory Visit (INDEPENDENT_AMBULATORY_CARE_PROVIDER_SITE_OTHER): Payer: BC Managed Care – PPO | Admitting: Medical-Surgical

## 2021-02-22 ENCOUNTER — Other Ambulatory Visit: Payer: Self-pay

## 2021-02-22 VITALS — BP 127/73 | HR 77

## 2021-02-22 DIAGNOSIS — F640 Transsexualism: Secondary | ICD-10-CM | POA: Diagnosis not present

## 2021-02-22 DIAGNOSIS — Z79899 Other long term (current) drug therapy: Secondary | ICD-10-CM

## 2021-02-22 DIAGNOSIS — Z789 Other specified health status: Secondary | ICD-10-CM

## 2021-02-22 NOTE — Progress Notes (Signed)
Agree with documentation as above.  ? ?___________________________________________ ?Ninoska Goswick L. Jairy Angulo, DNP, APRN, FNP-BC ?Primary Care and Sports Medicine ?[facility] ? ?

## 2021-02-22 NOTE — Progress Notes (Signed)
Established Patient Office Visit  Subjective:  Patient ID: Victoria Hill, adult    DOB: 1985-07-25  Age: 36 y.o. MRN: [id number]  CC:  Chief Complaint  Patient presents with   Injections    HPI Victoria Hill is here for a testosterone injection. Denies chest pain, shortness of breath, headaches or mood changes.    Past Medical History:  Diagnosis Date   Anxiety    COVID toes 2020   BREATHING ISSUES AND PAIN X 2 TO 3 MONTHS ALL ISSUES RESOLVED   Depression    Wears glasses 10/12/2020    Past Surgical History:  Procedure Laterality Date   CHEST SURGERY  2018   double mastectomy   VAGINAL HYSTERECTOMY Bilateral 10/19/2020   Procedure: VAGINAL HYSTERECTOMY  with BSO;  Surgeon: Donnamae Jude, MD;  Location: [facility];  Service: Gynecology;  Laterality: Bilateral;  With BSO add 30 minutes OR time.    History reviewed. No pertinent family history.  Social History   Socioeconomic History   Marital status: Single    Spouse name: Not on file   Number of children: Not on file   Years of education: Not on file   Highest education level: Not on file  Occupational History   Not on file  Tobacco Use   Smoking status: Never   Smokeless tobacco: Never  Vaping Use   Vaping Use: Never used  Substance and Sexual Activity   Alcohol use: Not Currently   Drug use: Never   Sexual activity: Never    Partners: Female  Other Topics Concern   Not on file  Social History Narrative   Not on file   Social Determinants of Health   Financial Resource Strain: Not on file  Food Insecurity: Not on file  Transportation Needs: Not on file  Physical Activity: Not on file  Stress: Not on file  Social Connections: Not on file  Intimate Partner Violence: Not on file    Outpatient Medications Prior to Visit  Medication Sig Dispense Refill   Ascorbic Acid (VITAMIN C PO) Take 100 mg by mouth.     buPROPion (WELLBUTRIN XL) 150 MG 24 hr tablet Take 1 tablet (150 mg total) by  mouth every morning. 90 tablet 3   escitalopram (LEXAPRO) 10 MG tablet Take 1 tablet (10 mg total) by mouth daily. Take with 75m dose to equal 155mdaily. 90 tablet 1   escitalopram (LEXAPRO) 5 MG tablet Take 1 tablet (5 mg total) by mouth daily. Take with 104mose to equal 44m46mily. 90 tablet 1   OVER THE COUNTER MEDICATION Zinc po 50 mg daily     TESTOSTERONE CYPIONATE IM Inject 100 mg into the muscle every 7 (seven) days.     VITAMIN D PO Take 50 mg by mouth.     Facility-Administered Medications Prior to Visit  Medication Dose Route Frequency Provider Last Rate Last Admin   testosterone cypionate (DEPOTESTOSTERONE CYPIONATE) injection 100 mg  100 mg Intramuscular Weekly AlexEmeterio Reeve   100 mg at 02/08/21 0936   [COMPLETED] testosterone cypionate (DEPOTESTOSTERONE CYPIONATE) injection 100 mg  100 mg Intramuscular Once JessSamuel Bouche   100 mg at 02/22/21 0946    No Known Allergies  ROS Review of Systems    Objective:    Physical Exam  BP 127/73    Pulse 77    SpO2 100%  Wt Readings from Last 3 Encounters:  02/08/21 197 lb (89.4 kg)  02/03/21 197 lb (89.4  kg)  01/11/21 197 lb (89.4 kg)     Health Maintenance Due  Topic Date Due   HIV Screening  Never done   Hepatitis C Screening  Never done    There are no preventive care reminders to display for this patient.  No results found for: TSH Lab Results  Component Value Date   WBC 6.8 02/11/2021   HGB 15.7 (H) 02/11/2021   HCT 46.0 (H) 02/11/2021   MCV 87.6 02/11/2021   PLT 349 02/11/2021   Lab Results  Component Value Date   NA 142 02/11/2021   K 3.9 02/11/2021   CO2 31 02/11/2021   GLUCOSE 81 02/11/2021   BUN 12 02/11/2021   CREATININE 1.06 (H) 02/11/2021   BILITOT 0.8 02/11/2021   AST 17 02/11/2021   ALT 23 02/11/2021   PROT 7.0 02/11/2021   CALCIUM 9.5 02/11/2021   EGFR 69 02/11/2021   Lab Results  Component Value Date   CHOL 191 02/11/2021   Lab Results  Component Value Date   HDL 40  (L) 02/11/2021   Lab Results  Component Value Date   LDLCALC 126 (H) 02/11/2021   Lab Results  Component Value Date   TRIG 130 02/11/2021   Lab Results  Component Value Date   CHOLHDL 4.8 02/11/2021   No results found for: HGBA1C    Assessment & Plan:  Injection - Patient tolerated injection well without complications. Patient advised to schedule next injection 7 days from today.    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   Follow-up: Return in about 1 week (around 03/01/2021) for Testosterone injection. [name], [name], [place]

## 2021-03-01 ENCOUNTER — Ambulatory Visit (INDEPENDENT_AMBULATORY_CARE_PROVIDER_SITE_OTHER): Payer: BC Managed Care – PPO | Admitting: Medical-Surgical

## 2021-03-01 ENCOUNTER — Other Ambulatory Visit: Payer: Self-pay

## 2021-03-01 VITALS — BP 136/78 | HR 74

## 2021-03-01 DIAGNOSIS — F640 Transsexualism: Secondary | ICD-10-CM

## 2021-03-01 DIAGNOSIS — Z79899 Other long term (current) drug therapy: Secondary | ICD-10-CM | POA: Diagnosis not present

## 2021-03-01 NOTE — Progress Notes (Signed)
Agree with documentation as above.  ? ?___________________________________________ ?Madysyn Hanken L. Stone Spirito, DNP, APRN, FNP-BC ?Primary Care and Sports Medicine ?[facility] ? ?

## 2021-03-01 NOTE — Progress Notes (Signed)
Established Patient Office Visit  Subjective:  Patient ID: Victoria Hill, adult    DOB: 1985-05-25  Age: 36 y.o. MRN: [id number]  CC: No chief complaint on file.   HPI Maziyah Vessel presents for testosterone injection. Denies chest pain, shortness of breath or mood changes.   Past Medical History:  Diagnosis Date   Anxiety    COVID toes 2020   BREATHING ISSUES AND PAIN X 2 TO 3 MONTHS ALL ISSUES RESOLVED   Depression    Wears glasses 10/12/2020    Past Surgical History:  Procedure Laterality Date   CHEST SURGERY  2018   double mastectomy   VAGINAL HYSTERECTOMY Bilateral 10/19/2020   Procedure: VAGINAL HYSTERECTOMY  with BSO;  Surgeon: Donnamae Jude, MD;  Location: [facility];  Service: Gynecology;  Laterality: Bilateral;  With BSO add 30 minutes OR time.    History reviewed. No pertinent family history.  Social History   Socioeconomic History   Marital status: Single    Spouse name: Not on file   Number of children: Not on file   Years of education: Not on file   Highest education level: Not on file  Occupational History   Not on file  Tobacco Use   Smoking status: Never   Smokeless tobacco: Never  Vaping Use   Vaping Use: Never used  Substance and Sexual Activity   Alcohol use: Not Currently   Drug use: Never   Sexual activity: Never    Partners: Female  Other Topics Concern   Not on file  Social History Narrative   Not on file   Social Determinants of Health   Financial Resource Strain: Not on file  Food Insecurity: Not on file  Transportation Needs: Not on file  Physical Activity: Not on file  Stress: Not on file  Social Connections: Not on file  Intimate Partner Violence: Not on file    Outpatient Medications Prior to Visit  Medication Sig Dispense Refill   Ascorbic Acid (VITAMIN C PO) Take 100 mg by mouth.     buPROPion (WELLBUTRIN XL) 150 MG 24 hr tablet Take 1 tablet (150 mg total) by mouth every morning. 90 tablet 3    escitalopram (LEXAPRO) 10 MG tablet Take 1 tablet (10 mg total) by mouth daily. Take with 75m dose to equal 171mdaily. 90 tablet 1   escitalopram (LEXAPRO) 5 MG tablet Take 1 tablet (5 mg total) by mouth daily. Take with 1074mose to equal 60m49mily. 90 tablet 1   OVER THE COUNTER MEDICATION Zinc po 50 mg daily     TESTOSTERONE CYPIONATE IM Inject 100 mg into the muscle every 7 (seven) days.     VITAMIN D PO Take 50 mg by mouth.     Facility-Administered Medications Prior to Visit  Medication Dose Route Frequency Provider Last Rate Last Admin   testosterone cypionate (DEPOTESTOSTERONE CYPIONATE) injection 100 mg  100 mg Intramuscular Weekly AlexEmeterio Reeve   100 mg at 02/08/21 [id number] Known Allergies  ROS Review of Systems    Objective:    Physical Exam  BP 136/78 (BP Location: Left Arm, Patient Position: Sitting, Cuff Size: Normal)    Pulse 74    SpO2 100%  Wt Readings from Last 3 Encounters:  02/08/21 197 lb (89.4 kg)  02/03/21 197 lb (89.4 kg)  01/11/21 197 lb (89.4 kg)     Health Maintenance Due  Topic Date Due   HIV Screening  Never done  Hepatitis C Screening  Never done    There are no preventive care reminders to display for this patient.  No results found for: TSH Lab Results  Component Value Date   WBC 6.8 02/11/2021   HGB 15.7 (H) 02/11/2021   HCT 46.0 (H) 02/11/2021   MCV 87.6 02/11/2021   PLT 349 02/11/2021   Lab Results  Component Value Date   NA 142 02/11/2021   K 3.9 02/11/2021   CO2 31 02/11/2021   GLUCOSE 81 02/11/2021   BUN 12 02/11/2021   CREATININE 1.06 (H) 02/11/2021   BILITOT 0.8 02/11/2021   AST 17 02/11/2021   ALT 23 02/11/2021   PROT 7.0 02/11/2021   CALCIUM 9.5 02/11/2021   EGFR 69 02/11/2021   Lab Results  Component Value Date   CHOL 191 02/11/2021   Lab Results  Component Value Date   HDL 40 (L) 02/11/2021   Lab Results  Component Value Date   LDLCALC 126 (H) 02/11/2021   Lab Results  Component Value  Date   TRIG 130 02/11/2021   Lab Results  Component Value Date   CHOLHDL 4.8 02/11/2021   No results found for: HGBA1C    Assessment & Plan:  Testosterone injection - patient tolerated injection well without complications. Patient advised to return in 1 week. 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   Follow-up: Return in about 1 week (around 03/08/2021) for Testosterone injection.    Beverlee Nims

## 2021-03-10 ENCOUNTER — Ambulatory Visit (INDEPENDENT_AMBULATORY_CARE_PROVIDER_SITE_OTHER): Payer: BC Managed Care – PPO | Admitting: Medical-Surgical

## 2021-03-10 ENCOUNTER — Other Ambulatory Visit: Payer: Self-pay

## 2021-03-10 VITALS — BP 130/76 | HR 77

## 2021-03-10 DIAGNOSIS — Z79899 Other long term (current) drug therapy: Secondary | ICD-10-CM | POA: Diagnosis not present

## 2021-03-10 DIAGNOSIS — F640 Transsexualism: Secondary | ICD-10-CM

## 2021-03-10 NOTE — Progress Notes (Signed)
Agree with documentation as above.  ? ?___________________________________________ ?Victoria Hill L. Karmelo Bass, DNP, APRN, FNP-BC ?Primary Care and Sports Medicine ?[facility] ? ?

## 2021-03-10 NOTE — Progress Notes (Signed)
Patient is here for testosterone injection. Denies chest pain, shortness of breath, headaches, and problems with medication or mood changes.   Patient tolerated testosterone 100 mg injection to LUOQ well without complications. Patient advised to schedule next injection in 7 days.   

## 2021-03-17 ENCOUNTER — Other Ambulatory Visit: Payer: Self-pay

## 2021-03-17 ENCOUNTER — Ambulatory Visit (INDEPENDENT_AMBULATORY_CARE_PROVIDER_SITE_OTHER): Payer: BC Managed Care – PPO | Admitting: Medical-Surgical

## 2021-03-17 VITALS — BP 132/73 | HR 77

## 2021-03-17 DIAGNOSIS — Z79899 Other long term (current) drug therapy: Secondary | ICD-10-CM

## 2021-03-17 DIAGNOSIS — F640 Transsexualism: Secondary | ICD-10-CM

## 2021-03-17 NOTE — Progress Notes (Signed)
Agree with documentation as above.  ? ?___________________________________________ ?Janyiah Silveri L. Teletha Petrea, DNP, APRN, FNP-BC ?Primary Care and Sports Medicine ?[facility] ? ?

## 2021-03-17 NOTE — Progress Notes (Signed)
Patient is here for testosterone injection. Denies chest pain, shortness of breath, headaches, and problems with medication or mood changes.   Patient tolerated testosterone 100 mg injection to RUOQ well without complications. Patient advised to schedule next injection in 7 days.

## 2021-03-24 ENCOUNTER — Other Ambulatory Visit: Payer: Self-pay

## 2021-03-24 ENCOUNTER — Ambulatory Visit (INDEPENDENT_AMBULATORY_CARE_PROVIDER_SITE_OTHER): Payer: BC Managed Care – PPO | Admitting: Medical-Surgical

## 2021-03-24 VITALS — BP 124/72 | HR 68

## 2021-03-24 DIAGNOSIS — F640 Transsexualism: Secondary | ICD-10-CM | POA: Diagnosis not present

## 2021-03-24 DIAGNOSIS — Z79899 Other long term (current) drug therapy: Secondary | ICD-10-CM

## 2021-03-24 NOTE — Progress Notes (Signed)
Agree with documentation as above.  ? ?___________________________________________ ?Victoria Menton L. Alysabeth Scalia, DNP, APRN, FNP-BC ?Primary Care and Sports Medicine ?[facility] ? ?

## 2021-03-24 NOTE — Progress Notes (Signed)
Patient is here for testosterone injection. Denies chest pain, shortness of breath, headaches, and problems with medication or mood changes.   Patient tolerated testosterone 100 mg injection to LUOQ well without complications. Patient advised to schedule next injection in 7 days.   

## 2021-03-31 ENCOUNTER — Ambulatory Visit (INDEPENDENT_AMBULATORY_CARE_PROVIDER_SITE_OTHER): Payer: BC Managed Care – PPO | Admitting: Medical-Surgical

## 2021-03-31 ENCOUNTER — Other Ambulatory Visit: Payer: Self-pay

## 2021-03-31 VITALS — BP 124/79 | HR 71

## 2021-03-31 DIAGNOSIS — F640 Transsexualism: Secondary | ICD-10-CM

## 2021-03-31 DIAGNOSIS — Z79899 Other long term (current) drug therapy: Secondary | ICD-10-CM

## 2021-03-31 MED ORDER — ESCITALOPRAM OXALATE 5 MG PO TABS: 5.0000 mg | ORAL_TABLET | Freq: Every day | ORAL | 1 refills | Status: DC

## 2021-03-31 NOTE — Progress Notes (Signed)
Patient is here for testosterone injection. Denies chest pain, shortness of breath, headaches, and problems with medication or mood changes.  ? ?Patient tolerated testosterone 100 mg injection to RUOQ well without complications. Patient advised to schedule next injection in 7 days.  ? ?Lexapro 5 mg refilled per patient request. Last seen in January for mood follow up.  ?

## 2021-03-31 NOTE — Progress Notes (Signed)
Agree with documentation as above.  ? ?___________________________________________ ?Naziyah Tieszen L. Neiman Roots, DNP, APRN, FNP-BC ?Primary Care and Sports Medicine ? [facility] ? ?

## 2021-04-07 ENCOUNTER — Ambulatory Visit (INDEPENDENT_AMBULATORY_CARE_PROVIDER_SITE_OTHER): Payer: BC Managed Care – PPO | Admitting: Medical-Surgical

## 2021-04-07 ENCOUNTER — Other Ambulatory Visit: Payer: Self-pay

## 2021-04-07 VITALS — BP 121/76 | HR 78

## 2021-04-07 DIAGNOSIS — Z79899 Other long term (current) drug therapy: Secondary | ICD-10-CM | POA: Diagnosis not present

## 2021-04-07 DIAGNOSIS — F640 Transsexualism: Secondary | ICD-10-CM

## 2021-04-07 NOTE — Progress Notes (Signed)
Agree with documentation as above.  ? ?___________________________________________ ?Akeila Lana L. Garan Frappier, DNP, APRN, FNP-BC ?Primary Care and Sports Medicine ? [facility] ? ?

## 2021-04-07 NOTE — Progress Notes (Signed)
Patient is here for testosterone injection. Denies chest pain, shortness of breath, headaches, and problems with medication or mood changes.   Patient tolerated testosterone 100 mg injection to LUOQ well without complications. Patient advised to schedule next injection in 7 days.   

## 2021-04-13 ENCOUNTER — Other Ambulatory Visit: Payer: Self-pay

## 2021-04-13 ENCOUNTER — Ambulatory Visit (INDEPENDENT_AMBULATORY_CARE_PROVIDER_SITE_OTHER): Payer: BC Managed Care – PPO | Admitting: Medical-Surgical

## 2021-04-13 VITALS — BP 114/78 | HR 84

## 2021-04-13 DIAGNOSIS — Z789 Other specified health status: Secondary | ICD-10-CM

## 2021-04-13 MED ORDER — TESTOSTERONE CYPIONATE 200 MG/ML IM SOLN
100.0000 mg | INTRAMUSCULAR | Status: DC
  Administered 2021-04-13 – 2021-06-21 (×7): 100 mg via INTRAMUSCULAR

## 2021-04-13 NOTE — Progress Notes (Signed)
Medical screening examination/treatment was performed by qualified nurse practitioner student and as supervising provider I was immediately available for consultation/collaboration. I have reviewed documentation and agree with assessment and plan. ° °Nereyda Bowler L. Zaiah Eckerson, DNP, APRN, FNP-BC °[facility] °Primary Care and Sports Medicine ° °

## 2021-04-13 NOTE — Progress Notes (Signed)
HPI:  Patient is here for a testosterone injection.  Denies chest pains, shortness of breath, headaches and problems with medication or mood changes. ? ?Assessment and Plan:  Testosterone '200mg'$ / ml 0.5cc administered in RUOQ.  Patient tolerated injection well without complications.  Patient advised to schedule next injection in 7 days. ? ?Charyl Bigger, CMA (AAMA) ? ?

## 2021-04-19 ENCOUNTER — Other Ambulatory Visit: Payer: Self-pay

## 2021-04-19 ENCOUNTER — Ambulatory Visit (INDEPENDENT_AMBULATORY_CARE_PROVIDER_SITE_OTHER): Payer: BC Managed Care – PPO | Admitting: Family Medicine

## 2021-04-19 VITALS — BP 117/25 | HR 79

## 2021-04-19 DIAGNOSIS — Z789 Other specified health status: Secondary | ICD-10-CM

## 2021-04-19 MED ORDER — TESTOSTERONE CYPIONATE 200 MG/ML IM SOLN
100.0000 mg | Freq: Once | INTRAMUSCULAR | Status: AC
  Administered 2021-04-19: 100 mg via INTRAMUSCULAR

## 2021-04-19 NOTE — Progress Notes (Signed)
HPI:  Patient is here for a testosterone injection.  Denies chest pains, shortness of breath, headaches and problems with medication or mood changes. ? ?Assessment and Plan:  Testosterone '100mg'$  administered in [place].  Patient tolerated injection well without complications.  Patient advised to schedule next injection in 7 days. ? ?Charyl Bigger, CMA (AAMA) ? ?

## 2021-04-25 NOTE — Progress Notes (Signed)
Agree with documentation as above.   Victoria Kandler, MD  

## 2021-04-26 ENCOUNTER — Ambulatory Visit (INDEPENDENT_AMBULATORY_CARE_PROVIDER_SITE_OTHER): Payer: BC Managed Care – PPO | Admitting: Medical-Surgical

## 2021-04-26 ENCOUNTER — Other Ambulatory Visit: Payer: Self-pay

## 2021-04-26 VITALS — BP 120/72 | HR 80

## 2021-04-26 DIAGNOSIS — Z789 Other specified health status: Secondary | ICD-10-CM

## 2021-04-26 MED ORDER — TESTOSTERONE CYPIONATE 200 MG/ML IM SOLN
100.0000 mg | Freq: Once | INTRAMUSCULAR | Status: AC
  Administered 2021-04-26: 100 mg via INTRAMUSCULAR

## 2021-04-26 NOTE — Progress Notes (Signed)
HPI:  Patient is here for a testosterone injection.  Injection administered RUOQ.  Denies chest pains, shortness of breath, headaches and problems with medication or mood changes. ? ?Assessment and Plan:  Patient tolerated injection well without complications.  Patient advised to schedule next injection in 14 days. ? ?Charyl Bigger, CMA (AAMA) ? ?

## 2021-04-26 NOTE — Progress Notes (Signed)
Agree with documentation as above.  ? ?___________________________________________ ?Lemario Chaikin L. Ramar Nobrega, DNP, APRN, FNP-BC ?Primary Care and Sports Medicine ?[facility] ? ?

## 2021-05-03 ENCOUNTER — Ambulatory Visit (INDEPENDENT_AMBULATORY_CARE_PROVIDER_SITE_OTHER): Payer: BC Managed Care – PPO | Admitting: Medical-Surgical

## 2021-05-03 VITALS — BP 112/72 | HR 80 | Ht 66.0 in | Wt 201.0 lb

## 2021-05-03 DIAGNOSIS — Z789 Other specified health status: Secondary | ICD-10-CM | POA: Diagnosis not present

## 2021-05-03 MED ORDER — BUPROPION HCL ER (XL) 150 MG PO TB24: 150.0000 mg | ORAL_TABLET | ORAL | 3 refills | Status: DC

## 2021-05-03 MED ORDER — ESCITALOPRAM OXALATE 10 MG PO TABS: 10.0000 mg | ORAL_TABLET | Freq: Every day | ORAL | 1 refills | Status: DC

## 2021-05-03 MED ORDER — TESTOSTERONE CYPIONATE 200 MG/ML IM SOLN
100.0000 mg | Freq: Once | INTRAMUSCULAR | Status: AC
  Administered 2021-05-03: 100 mg via INTRAMUSCULAR

## 2021-05-03 NOTE — Progress Notes (Signed)
? ?  Subjective:  ? ? Patient ID: Victoria Hill, adult    DOB: 1985-09-30, 36 y.o.   MRN: [id number] ? ?HPI ?Patient is a female-to-female transgender here for a testosterone injection. Denies, chest pain, shortness of breath, headaches and problems with medication or mood changes.  ? ? ?Review of Systems ? ?   ?Objective:  ? Physical Exam ? ? ? ? ?   ?Assessment & Plan:  ? ?Patient tolerated injection well without complications. Patient advised to schedule next injection in 7 days.  ?

## 2021-05-10 ENCOUNTER — Ambulatory Visit (INDEPENDENT_AMBULATORY_CARE_PROVIDER_SITE_OTHER): Payer: BC Managed Care – PPO | Admitting: Medical-Surgical

## 2021-05-10 VITALS — BP 119/68 | HR 74

## 2021-05-10 DIAGNOSIS — Z789 Other specified health status: Secondary | ICD-10-CM | POA: Diagnosis not present

## 2021-05-10 NOTE — Progress Notes (Signed)
Agree with documentation as above.  ? ?___________________________________________ ?Antigone Crowell L. Berlie Persky, DNP, APRN, FNP-BC ?Primary Care and Sports Medicine ?[facility] ? ?

## 2021-05-10 NOTE — Progress Notes (Signed)
? ?Established Patient Office Visit ? ?Subjective:  ?Patient ID: Victoria Hill, adult    DOB: 1985-08-13  Age: 36 y.o. MRN: [id number] ? ?CC:  ?Chief Complaint  ?Patient presents with  ? Injections  ? ? ?HPI ?Victoria Hill is here for a testosterone injection. Denies chest pain, shortness of breath, headaches or mood changes.   ? ?Past Medical History:  ?Diagnosis Date  ? Anxiety   ? COVID toes 2020  ? BREATHING ISSUES AND PAIN X 2 TO 3 MONTHS ALL ISSUES RESOLVED  ? Depression   ? Wears glasses 10/12/2020  ? ? ?Past Surgical History:  ?Procedure Laterality Date  ? CHEST SURGERY  2018  ? double mastectomy  ? VAGINAL HYSTERECTOMY Bilateral 10/19/2020  ? Procedure: VAGINAL HYSTERECTOMY  with BSO;  Surgeon: Donnamae Jude, MD;  Location: [facility];  Service: Gynecology;  Laterality: Bilateral;  With BSO add 30 minutes OR time.  ? ? ?History reviewed. No pertinent family history. ? ?Social History  ? ?Socioeconomic History  ? Marital status: Single  ?  Spouse name: Not on file  ? Number of children: Not on file  ? Years of education: Not on file  ? Highest education level: Not on file  ?Occupational History  ? Not on file  ?Tobacco Use  ? Smoking status: Never  ? Smokeless tobacco: Never  ?Vaping Use  ? Vaping Use: Never used  ?Substance and Sexual Activity  ? Alcohol use: Not Currently  ? Drug use: Never  ? Sexual activity: Never  ?  Partners: Female  ?Other Topics Concern  ? Not on file  ?Social History Narrative  ? Not on file  ? ?Social Determinants of Health  ? ?Financial Resource Strain: Not on file  ?Food Insecurity: Not on file  ?Transportation Needs: Not on file  ?Physical Activity: Not on file  ?Stress: Not on file  ?Social Connections: Not on file  ?Intimate Partner Violence: Not on file  ? ? ?Outpatient Medications Prior to Visit  ?Medication Sig Dispense Refill  ? Ascorbic Acid (VITAMIN C PO) Take 100 mg by mouth.    ? buPROPion (WELLBUTRIN XL) 150 MG 24 hr tablet Take 1 tablet (150 mg total) by  mouth every morning. 90 tablet 3  ? escitalopram (LEXAPRO) 10 MG tablet Take 1 tablet (10 mg total) by mouth daily. Take with 61m dose to equal 127mdaily. 90 tablet 1  ? escitalopram (LEXAPRO) 5 MG tablet Take 1 tablet (5 mg total) by mouth daily. Take with 1049mose to equal 12m74mily. 90 tablet 1  ? OVER THE COUNTER MEDICATION Zinc po 50 mg daily    ? TESTOSTERONE CYPIONATE IM Inject 100 mg into the muscle every 7 (seven) days.    ? VITAMIN D PO Take 50 mg by mouth.    ? ?Facility-Administered Medications Prior to Visit  ?Medication Dose Route Frequency Provider Last Rate Last Admin  ? testosterone cypionate (DEPOTESTOSTERONE CYPIONATE) injection 100 mg  100 mg Intramuscular Weekly AlexEmeterio Reeve   100 mg at 04/07/21 0957  ? testosterone cypionate (DEPOTESTOSTERONE CYPIONATE) injection 100 mg  100 mg Intramuscular Q7 days JessSamuel Bouche   100 mg at 05/10/21 0900  ? ? ?No Known Allergies ? ?ROS ?Review of Systems ? ?  ?Objective:  ?  ?Physical Exam ? ?BP 119/68   Pulse 74   SpO2 100%  ?Wt Readings from Last 3 Encounters:  ?05/03/21 201 lb (91.2 kg)  ?02/08/21 197 lb (89.4 kg)  ?  02/03/21 197 lb (89.4 kg)  ? ? ? ?Health Maintenance Due  ?Topic Date Due  ? HIV Screening  Never done  ? Hepatitis C Screening  Never done  ? ? ?There are no preventive care reminders to display for this patient. ? ?No results found for: TSH ?Lab Results  ?Component Value Date  ? WBC 6.8 02/11/2021  ? HGB 15.7 (H) 02/11/2021  ? HCT 46.0 (H) 02/11/2021  ? MCV 87.6 02/11/2021  ? PLT 349 02/11/2021  ? ?Lab Results  ?Component Value Date  ? NA 142 02/11/2021  ? K 3.9 02/11/2021  ? CO2 31 02/11/2021  ? GLUCOSE 81 02/11/2021  ? BUN 12 02/11/2021  ? CREATININE 1.06 (H) 02/11/2021  ? BILITOT 0.8 02/11/2021  ? AST 17 02/11/2021  ? ALT 23 02/11/2021  ? PROT 7.0 02/11/2021  ? CALCIUM 9.5 02/11/2021  ? EGFR 69 02/11/2021  ? ?Lab Results  ?Component Value Date  ? CHOL 191 02/11/2021  ? ?Lab Results  ?Component Value Date  ? HDL 40 (L)  02/11/2021  ? ?Lab Results  ?Component Value Date  ? LDLCALC 126 (H) 02/11/2021  ? ?Lab Results  ?Component Value Date  ? TRIG 130 02/11/2021  ? ?Lab Results  ?Component Value Date  ? CHOLHDL 4.8 02/11/2021  ? ?No results found for: HGBA1C ? ?  ?Assessment & Plan:  ?Testosterone injection - Patient tolerated injection well without complications. Patient advised to schedule next injection 7 days from today.   ? ?Problem List Items Addressed This Visit   ?None ?Visit Diagnoses   ? ? Female-to-female transgender person    -  Primary  ? ?  ? ? ?No orders of the defined types were placed in this encounter. ? ? ?Follow-up: Return in about 1 week (around 05/17/2021) for testosterone injection. .  ? ? ?Lavell Luster, [place] ?

## 2021-05-17 ENCOUNTER — Ambulatory Visit (INDEPENDENT_AMBULATORY_CARE_PROVIDER_SITE_OTHER): Payer: BC Managed Care – PPO | Admitting: Family Medicine

## 2021-05-17 VITALS — BP 119/69 | HR 79

## 2021-05-17 DIAGNOSIS — Z789 Other specified health status: Secondary | ICD-10-CM

## 2021-05-17 NOTE — Progress Notes (Signed)
Medical screening examination/treatment was performed by qualified clinical staff member and as supervising physician I was immediately available for consultation/collaboration. I have reviewed documentation and agree with assessment and plan.  Axten Pascucci, DO  

## 2021-05-17 NOTE — Progress Notes (Signed)
? ?  Established Patient Office Visit ? ?Subjective   ?Patient ID: Victoria Hill, adult    DOB: 02-08-1985  Age: 36 y.o. MRN: [id number] ? ?Chief Complaint  ?Patient presents with  ? Injections  ? ? ?HPI ?Victoria Hill is here for a testosterone injection. Denies chest pain, shortness of breath, headaches or mood changes.   ? ? ? ?ROS ? ?  ?Objective:  ?  ? ?BP 119/69   Pulse 79   SpO2 100%  ? ? ?Physical Exam ? ? ?No results found for any visits on 05/17/21. ? ? ? ?The ASCVD Risk score (Arnett DK, et al., 2019) failed to calculate for the following reasons: ?  The 2019 ASCVD risk score is only valid for ages 19 to 58 ? ?  ?Assessment & Plan:  ?Injection - Patient tolerated injection well without complications. Patient advised to schedule next injection 14 days from today.   ? ?Problem List Items Addressed This Visit   ?None ?Visit Diagnoses   ? ? Female-to-female transgender person    -  Primary  ? ?  ? ? ?Return in about 1 week (around 05/24/2021) for testosterone injection. .  ? ? ?Lavell Luster, [place] ? ?

## 2021-05-24 ENCOUNTER — Ambulatory Visit (INDEPENDENT_AMBULATORY_CARE_PROVIDER_SITE_OTHER): Payer: BC Managed Care – PPO | Admitting: Medical-Surgical

## 2021-05-24 VITALS — BP 131/83 | HR 82 | Ht 66.0 in | Wt 201.0 lb

## 2021-05-24 DIAGNOSIS — Z789 Other specified health status: Secondary | ICD-10-CM

## 2021-05-24 NOTE — Progress Notes (Signed)
Patient is here for a testosterone injection of 100mg. Given in RUOQ.  Denies chest pain, shortness of breath, headaches and problems with medication or mood changes.  Tolerated injection well without complications.   Patient advised to schedule next injection in 7 days.  

## 2021-05-24 NOTE — Progress Notes (Signed)
Agree with documentation as above.  ? ?___________________________________________ ?Berkeley Vanaken L. Clive Parcel, DNP, APRN, FNP-BC ?Primary Care and Sports Medicine ?[facility] ? ?

## 2021-05-31 ENCOUNTER — Ambulatory Visit (INDEPENDENT_AMBULATORY_CARE_PROVIDER_SITE_OTHER): Payer: BC Managed Care – PPO | Admitting: Medical-Surgical

## 2021-05-31 VITALS — BP 127/83 | HR 69

## 2021-05-31 DIAGNOSIS — Z789 Other specified health status: Secondary | ICD-10-CM

## 2021-05-31 NOTE — Progress Notes (Signed)
Agree with documentation as above.  ? ?___________________________________________ ?Lilburn Straw L. Albertha Beattie, DNP, APRN, FNP-BC ?Primary Care and Sports Medicine ?[facility] ? ?

## 2021-05-31 NOTE — Progress Notes (Signed)
? ?  Established Patient Office Visit ? ?Subjective   ?Patient ID: Victoria Hill, adult    DOB: Feb 14, 1985  Age: 36 y.o. MRN: [id number] ? ?Chief Complaint  ?Patient presents with  ? Injections  ? ? ?HPI ? ?Orlean Holtrop is here for a testosterone injection. Denies chest pain, shortness of breath, headaches or mood changes.  ? ?ROS ? ?  ?Objective:  ?  ? ?BP 127/83   Pulse 69   SpO2 100%  ? ? ?Physical Exam ? ? ?No results found for any visits on 05/31/21. ? ? ? ?The ASCVD Risk score (Arnett DK, et al., 2019) failed to calculate for the following reasons: ?  The 2019 ASCVD risk score is only valid for ages 50 to 73 ? ?  ?Assessment & Plan:  ?Testosterone injection - Patient tolerated injection well without complications. Patient advised to schedule next injection 7 days from today.   ? ?Problem List Items Addressed This Visit   ?None ?Visit Diagnoses   ? ? Female-to-female transgender person    -  Primary  ? ?  ? ? ?Return in about 1 week (around 06/07/2021) for Testosterone injection. .  ? ? ?Lavell Luster, [place] ? ?

## 2021-06-07 ENCOUNTER — Ambulatory Visit (INDEPENDENT_AMBULATORY_CARE_PROVIDER_SITE_OTHER): Payer: BC Managed Care – PPO | Admitting: Medical-Surgical

## 2021-06-07 VITALS — BP 119/80 | HR 85

## 2021-06-07 DIAGNOSIS — Z789 Other specified health status: Secondary | ICD-10-CM

## 2021-06-07 NOTE — Progress Notes (Signed)
Agree with documentation as below.  ___________________________________________ Payson Crumby L. Burnadette Baskett, DNP, APRN, FNP-BC Primary Care and Sports Medicine [facility]  

## 2021-06-07 NOTE — Progress Notes (Signed)
? ?  Established Patient Office Visit ? ?Subjective   ?Patient ID: Victoria Hill, adult    DOB: [birth year]  Age: 36 y.o. MRN: [id number] ? ?Chief Complaint  ?Patient presents with  ? Injections  ? ? ?HPI ? ?Victoria Hill is here for a testosterone injection. Denies chest pain, shortness of breath, headaches or mood changes.   ? ?ROS ? ?  ?Objective:  ?  ? ?BP 119/80   Pulse 85   SpO2 99%  ? ? ?Physical Exam ? ? ?No results found for any visits on 06/07/21. ? ? ? ?The ASCVD Risk score (Arnett DK, et al., 2019) failed to calculate for the following reasons: ?  The 2019 ASCVD risk score is only valid for ages 104 to 73 ? ?  ?Assessment & Plan:  ?Testosterone injection - Patient tolerated injection well without complications. Patient advised to schedule next injection 7 days from today.   ? ?Problem List Items Addressed This Visit   ?None ?Visit Diagnoses   ? ? Female-to-female transgender person    -  Primary  ? ?  ? ? ?Return in about 1 week (around 06/14/2021) for testosterone injection. .  ? ? ?Lavell Luster, [place] ? ?

## 2021-06-13 ENCOUNTER — Ambulatory Visit: Payer: BC Managed Care – PPO

## 2021-06-16 ENCOUNTER — Ambulatory Visit (INDEPENDENT_AMBULATORY_CARE_PROVIDER_SITE_OTHER): Payer: BC Managed Care – PPO | Admitting: Medical-Surgical

## 2021-06-16 VITALS — BP 114/78 | HR 79 | Temp 97.8°F | Ht 64.0 in | Wt 203.1 lb

## 2021-06-16 DIAGNOSIS — F640 Transsexualism: Secondary | ICD-10-CM

## 2021-06-16 DIAGNOSIS — Z79899 Other long term (current) drug therapy: Secondary | ICD-10-CM

## 2021-06-16 MED ORDER — TESTOSTERONE CYPIONATE 200 MG/ML IM SOLN
100.0000 mg | Freq: Once | INTRAMUSCULAR | Status: AC
  Administered 2021-06-16: 100 mg via INTRAMUSCULAR

## 2021-06-16 NOTE — Progress Notes (Signed)
Patient is here for a testosterone injection. Denies chest pain, shortness of breath, headaches and problems with medication or mood changes.   Patient tolerated injection on LUOQ well without complications. Patient advised to schedule next injection in 7 days.   

## 2021-06-16 NOTE — Progress Notes (Signed)
Agree with documentation as below.  ___________________________________________ Eytan Carrigan L. Fenton Candee, DNP, APRN, FNP-BC Primary Care and Sports Medicine [facility]  

## 2021-06-21 ENCOUNTER — Ambulatory Visit (INDEPENDENT_AMBULATORY_CARE_PROVIDER_SITE_OTHER): Payer: BC Managed Care – PPO | Admitting: Medical-Surgical

## 2021-06-21 VITALS — BP 122/78 | HR 81

## 2021-06-21 DIAGNOSIS — Z789 Other specified health status: Secondary | ICD-10-CM

## 2021-06-21 NOTE — Progress Notes (Signed)
Agree with documentation as above.  ? ?___________________________________________ ?Avanthika Dehnert L. Naziya Hegwood, DNP, APRN, FNP-BC ?Primary Care and Sports Medicine ?[facility] ? ?

## 2021-06-21 NOTE — Progress Notes (Signed)
Patient is here for testosterone injection. Denies chest pain, shortness of breath, headaches, and problems with medication or mood changes.   Patient tolerated testosterone 100 mg injection to RUOQ well without complications. Patient advised to schedule next injection in 7 days.

## 2021-06-28 ENCOUNTER — Ambulatory Visit (INDEPENDENT_AMBULATORY_CARE_PROVIDER_SITE_OTHER): Payer: BC Managed Care – PPO | Admitting: Medical-Surgical

## 2021-06-28 VITALS — BP 134/74 | HR 74

## 2021-06-28 DIAGNOSIS — Z789 Other specified health status: Secondary | ICD-10-CM | POA: Diagnosis not present

## 2021-06-28 MED ORDER — TESTOSTERONE CYPIONATE 200 MG/ML IM SOLN
100.0000 mg | INTRAMUSCULAR | Status: AC
  Administered 2021-06-28: 100 mg via INTRAMUSCULAR

## 2021-06-28 NOTE — Progress Notes (Signed)
Patient is here for testosterone injection. Denies chest pain, shortness of breath, headaches, and problems with medication or mood changes.   Patient tolerated testosterone 100 mg injection to LUOQ well without complications. Patient advised to schedule next injection in 7 days.   

## 2021-06-28 NOTE — Progress Notes (Signed)
Agree with documentation as below.  ___________________________________________ Rafaela Dinius L. Lakai Moree, DNP, APRN, FNP-BC Primary Care and Sports Medicine [facility]  

## 2021-07-06 ENCOUNTER — Ambulatory Visit (INDEPENDENT_AMBULATORY_CARE_PROVIDER_SITE_OTHER): Payer: BC Managed Care – PPO | Admitting: Medical-Surgical

## 2021-07-06 ENCOUNTER — Other Ambulatory Visit: Payer: Self-pay

## 2021-07-06 VITALS — BP 130/75 | HR 83

## 2021-07-06 DIAGNOSIS — F640 Transsexualism: Secondary | ICD-10-CM | POA: Diagnosis not present

## 2021-07-06 DIAGNOSIS — Z789 Other specified health status: Secondary | ICD-10-CM | POA: Diagnosis not present

## 2021-07-06 DIAGNOSIS — Z79899 Other long term (current) drug therapy: Secondary | ICD-10-CM

## 2021-07-06 MED ORDER — ESCITALOPRAM OXALATE 5 MG PO TABS: 5.0000 mg | ORAL_TABLET | Freq: Every day | ORAL | 0 refills | Status: DC

## 2021-07-06 MED ORDER — TESTOSTERONE CYPIONATE 200 MG/ML IM SOLN
100.0000 mg | INTRAMUSCULAR | Status: DC
  Administered 2021-07-06: 100 mg via INTRAMUSCULAR

## 2021-07-06 NOTE — Progress Notes (Signed)
HPI:  Patient is here for a testosterone injection.  Denies chest pains, shortness of breath, headaches and problems with medication or mood changes.  Assessment and Plan:  Patient tolerated injection well without complications.  Patient advised to schedule next injection in 7 days.  T. Yajayra Feldt, CMA (AAMA)  

## 2021-07-06 NOTE — Progress Notes (Signed)
Agree with documentation as below.  ___________________________________________ Dmari Schubring L. Leaf Kernodle, DNP, APRN, FNP-BC Primary Care and Sports Medicine [facility]  

## 2021-07-12 ENCOUNTER — Ambulatory Visit (INDEPENDENT_AMBULATORY_CARE_PROVIDER_SITE_OTHER): Payer: BC Managed Care – PPO | Admitting: Medical-Surgical

## 2021-07-12 VITALS — BP 119/77 | HR 79

## 2021-07-12 DIAGNOSIS — Z79899 Other long term (current) drug therapy: Secondary | ICD-10-CM

## 2021-07-12 DIAGNOSIS — F640 Transsexualism: Secondary | ICD-10-CM | POA: Diagnosis not present

## 2021-07-12 DIAGNOSIS — Z789 Other specified health status: Secondary | ICD-10-CM

## 2021-07-12 MED ORDER — TESTOSTERONE CYPIONATE 200 MG/ML IM SOLN
100.0000 mg | INTRAMUSCULAR | Status: DC
  Administered 2021-07-12 – 2022-02-23 (×20): 100 mg via INTRAMUSCULAR

## 2021-07-12 NOTE — Progress Notes (Signed)
Agree with documentation as below.  ___________________________________________ Nusaiba Guallpa L. Hafsah Hendler, DNP, APRN, FNP-BC Primary Care and Sports Medicine [facility]  

## 2021-07-12 NOTE — Progress Notes (Signed)
HPI:  Patient is here for a testosterone injection.  Denies chest pains, shortness of breath, headaches and problems with medication or mood changes.  Assessment and Plan:  Patient tolerated injection well without complications.  Patient advised to schedule next injection in 7 days.  T. Isis Costanza, CMA (AAMA)  

## 2021-07-19 ENCOUNTER — Ambulatory Visit (INDEPENDENT_AMBULATORY_CARE_PROVIDER_SITE_OTHER): Payer: BC Managed Care – PPO | Admitting: Family Medicine

## 2021-07-19 VITALS — BP 112/81 | HR 74

## 2021-07-19 DIAGNOSIS — F640 Transsexualism: Secondary | ICD-10-CM

## 2021-07-19 DIAGNOSIS — Z79899 Other long term (current) drug therapy: Secondary | ICD-10-CM | POA: Diagnosis not present

## 2021-07-19 DIAGNOSIS — Z789 Other specified health status: Secondary | ICD-10-CM | POA: Diagnosis not present

## 2021-07-19 NOTE — Progress Notes (Signed)
   Subjective:    Patient ID: Victoria Hill, adult    DOB: [birth year], 36 y.o.   MRN: [id number]  HPI Patient is here for a testosterone injection. Denies chest pain, shortness of breath, headaches and problems with medication or mood changes.   Review of Systems     Objective:   Physical Exam        Assessment & Plan:   Patient tolerated injection in the Right Upper outer Quadrant well without complications. Patient advised to schedule next injection in 7 days.

## 2021-07-19 NOTE — Progress Notes (Signed)
Medical screening examination/treatment was performed by qualified clinical staff member and as supervising physician I was immediately available for consultation/collaboration. I have reviewed documentation and agree with assessment and plan.  Nandi Tonnesen, DO  

## 2021-07-26 ENCOUNTER — Ambulatory Visit (INDEPENDENT_AMBULATORY_CARE_PROVIDER_SITE_OTHER): Payer: BC Managed Care – PPO | Admitting: Medical-Surgical

## 2021-07-26 VITALS — BP 126/79 | HR 80

## 2021-07-26 DIAGNOSIS — Z79899 Other long term (current) drug therapy: Secondary | ICD-10-CM | POA: Diagnosis not present

## 2021-07-26 DIAGNOSIS — Z789 Other specified health status: Secondary | ICD-10-CM

## 2021-07-26 DIAGNOSIS — F640 Transsexualism: Secondary | ICD-10-CM

## 2021-08-01 ENCOUNTER — Ambulatory Visit (INDEPENDENT_AMBULATORY_CARE_PROVIDER_SITE_OTHER): Payer: BC Managed Care – PPO | Admitting: Medical-Surgical

## 2021-08-01 VITALS — BP 102/67 | HR 84 | Ht 64.0 in | Wt 205.0 lb

## 2021-08-01 DIAGNOSIS — Z789 Other specified health status: Secondary | ICD-10-CM | POA: Diagnosis not present

## 2021-08-01 MED ORDER — TESTOSTERONE CYPIONATE 200 MG/ML IM SOLN
100.0000 mg | Freq: Once | INTRAMUSCULAR | Status: AC
  Administered 2021-08-01: 100 mg via INTRAMUSCULAR

## 2021-08-01 MED ORDER — BUPROPION HCL ER (XL) 150 MG PO TB24: 150.0000 mg | ORAL_TABLET | ORAL | 3 refills | Status: DC

## 2021-08-01 NOTE — Progress Notes (Signed)
   Subjective:    Patient ID: Victoria Hill, adult    DOB: [birth year], 36 y.o.   MRN: [id number]  HPI Patient is here for a testosterone injection. Denies chest pain, shortness of breath, headaches and problems with medication or mood changes.    Review of Systems     Objective:   Physical Exam        Assessment & Plan:  Patient tolerated injections well without complications. Patient advised to schedule next injection in one week. Patient did ask for a refill on his bupropion while he was here.

## 2021-08-01 NOTE — Progress Notes (Signed)
Agree with documentation as below.  Refill of Wellbutrin sent. ___________________________________________ Clearnce Sorrel, DNP, APRN, FNP-BC Primary Care and Sports Medicine [place]

## 2021-08-09 ENCOUNTER — Ambulatory Visit (INDEPENDENT_AMBULATORY_CARE_PROVIDER_SITE_OTHER): Payer: BC Managed Care – PPO | Admitting: Medical-Surgical

## 2021-08-09 DIAGNOSIS — F640 Transsexualism: Secondary | ICD-10-CM

## 2021-08-09 DIAGNOSIS — Z79899 Other long term (current) drug therapy: Secondary | ICD-10-CM

## 2021-08-09 MED ORDER — TESTOSTERONE CYPIONATE 200 MG/ML IM SOLN
100.0000 mg | Freq: Once | INTRAMUSCULAR | Status: AC
  Administered 2021-08-09: 100 mg via INTRAMUSCULAR

## 2021-08-09 NOTE — Progress Notes (Signed)
Agree with documentation as below.  Patient is due for labs and a follow-up visit with provider.  Please contact him to get him scheduled at his earliest convenience.  ___________________________________________ Clearnce Sorrel, DNP, APRN, FNP-BC Primary Care and [place]

## 2021-08-09 NOTE — Progress Notes (Signed)
LVM for patient to call back to get appt/labs scheduled with PCP. A MUCK

## 2021-08-09 NOTE — Progress Notes (Signed)
HPI:  Patient is here for a testosterone injection.  Denies chest pains, shortness of breath, headaches and problems with medication or mood changes.  Assessment and Plan:  Patient tolerated injection well without complications.  Patient advised to schedule next injection in 7 days.  Charyl Bigger, CMA (AAMA)

## 2021-08-16 ENCOUNTER — Ambulatory Visit (INDEPENDENT_AMBULATORY_CARE_PROVIDER_SITE_OTHER): Payer: BC Managed Care – PPO | Admitting: Medical-Surgical

## 2021-08-16 VITALS — BP 123/73 | HR 84

## 2021-08-16 DIAGNOSIS — Z789 Other specified health status: Secondary | ICD-10-CM | POA: Diagnosis not present

## 2021-08-16 DIAGNOSIS — F640 Transsexualism: Secondary | ICD-10-CM | POA: Diagnosis not present

## 2021-08-16 DIAGNOSIS — Z79899 Other long term (current) drug therapy: Secondary | ICD-10-CM | POA: Diagnosis not present

## 2021-08-16 NOTE — Progress Notes (Signed)
Agree with documentation as below.  ___________________________________________ Alira Fretwell L. Margherita Collyer, DNP, APRN, FNP-BC Primary Care and Sports Medicine [facility]  

## 2021-08-16 NOTE — Progress Notes (Signed)
   Established Patient Office Visit  Subjective   Patient ID: Radha Coggins, adult    DOB: Dec 10, 1985  Age: 36 y.o. MRN: [id number]  Chief Complaint  Patient presents with   Injections    HPI  Trudee Chirino is here for a testosterone injection. Denies chest pain, shortness of breath, headaches or mood changes.   ROS    Objective:     BP 123/73   Pulse 84   SpO2 98%    Physical Exam   No results found for any visits on 08/16/21.    The ASCVD Risk score (Arnett DK, et al., 2019) failed to calculate for the following reasons:   The 2019 ASCVD risk score is only valid for ages 62 to 57    Assessment & Plan:  Testosterone injection - Patient tolerated injection well without complications. Patient advised to schedule next injection 7 days from today.    Problem List Items Addressed This Visit   None Visit Diagnoses     Transgender man on hormone therapy    -  Primary       Return in about 1 week (around 08/23/2021) for testosterone injection. [name], [name], [place]

## 2021-08-23 ENCOUNTER — Ambulatory Visit (INDEPENDENT_AMBULATORY_CARE_PROVIDER_SITE_OTHER): Payer: BC Managed Care – PPO | Admitting: Medical-Surgical

## 2021-08-23 VITALS — BP 122/81 | HR 78

## 2021-08-23 DIAGNOSIS — Z789 Other specified health status: Secondary | ICD-10-CM

## 2021-08-23 DIAGNOSIS — Z79899 Other long term (current) drug therapy: Secondary | ICD-10-CM | POA: Diagnosis not present

## 2021-08-23 DIAGNOSIS — F640 Transsexualism: Secondary | ICD-10-CM | POA: Diagnosis not present

## 2021-08-23 NOTE — Progress Notes (Signed)
Agree with documentation below.  ___________________________________________ Clearnce Sorrel, DNP, APRN, FNP-BC Primary Care and Sports Medicine [place]

## 2021-08-23 NOTE — Progress Notes (Signed)
   Established Patient Office Visit  Subjective   Patient ID: Victoria Hill, adult    DOB: [birth year]  Age: 36 y.o. MRN: [id number]  Chief Complaint  Patient presents with   Injections    HPI  Victoria Hill is here for a testosterone injection. Denies chest pain, shortness of breath, headaches or mood changes.   ROS    Objective:     BP 122/81   Pulse 78   SpO2 100%    Physical Exam   No results found for any visits on 08/23/21.    The ASCVD Risk score (Arnett DK, et al., 2019) failed to calculate for the following reasons:   The 2019 ASCVD risk score is only valid for ages 18 to 8    Assessment & Plan:  Injection - Patient tolerated injection well without complications. Patient advised to schedule next injection 7 days from today.     Problem List Items Addressed This Visit   None Visit Diagnoses     Transgender man on hormone therapy    -  Primary       Return in about 1 week (around 08/30/2021) for testosterone injection. [name], [name], [place]

## 2021-08-30 ENCOUNTER — Ambulatory Visit (INDEPENDENT_AMBULATORY_CARE_PROVIDER_SITE_OTHER): Payer: BC Managed Care – PPO | Admitting: Medical-Surgical

## 2021-08-30 VITALS — BP 122/73 | HR 73 | Wt 208.0 lb

## 2021-08-30 DIAGNOSIS — F640 Transsexualism: Secondary | ICD-10-CM | POA: Diagnosis not present

## 2021-08-30 DIAGNOSIS — Z79899 Other long term (current) drug therapy: Secondary | ICD-10-CM | POA: Diagnosis not present

## 2021-08-30 MED ORDER — TESTOSTERONE CYPIONATE 200 MG/ML IM SOLN
100.0000 mg | Freq: Once | INTRAMUSCULAR | Status: AC
  Administered 2021-08-30: 100 mg via INTRAMUSCULAR

## 2021-08-30 NOTE — Progress Notes (Signed)
Pt here for testosterone injection no SOB,CP or mood swings. Injection tolerated well given ROUQ.   Pt to RTC in 1 week for next injection.

## 2021-08-30 NOTE — Progress Notes (Signed)
Agree with documentation as below.  ___________________________________________ Kaveri Perras L. Amias Hutchinson, DNP, APRN, FNP-BC Primary Care and Sports Medicine [facility]  

## 2021-09-06 ENCOUNTER — Ambulatory Visit (INDEPENDENT_AMBULATORY_CARE_PROVIDER_SITE_OTHER): Payer: BC Managed Care – PPO | Admitting: Medical-Surgical

## 2021-09-06 VITALS — BP 117/78 | HR 74 | Temp 98.2°F | Ht 64.0 in | Wt 208.8 lb

## 2021-09-06 DIAGNOSIS — F640 Transsexualism: Secondary | ICD-10-CM | POA: Diagnosis not present

## 2021-09-06 DIAGNOSIS — Z79899 Other long term (current) drug therapy: Secondary | ICD-10-CM

## 2021-09-06 MED ORDER — TESTOSTERONE CYPIONATE 100 MG/ML IM SOLN: 100.0000 mg | Freq: Once | INTRAMUSCULAR | Status: DC

## 2021-09-06 MED ORDER — TESTOSTERONE CYPIONATE 200 MG/ML IM SOLN
100.0000 mg | Freq: Once | INTRAMUSCULAR | Status: AC
  Administered 2021-09-06: 100 mg via INTRAMUSCULAR

## 2021-09-06 NOTE — Progress Notes (Signed)
Patient is here for a testosterone injection. Denies chest pain, shortness of breath, headaches and problems with medication or mood changes.   Patient tolerated injection on LUOQ well without complications. Patient advised to schedule next injection in 7 days.

## 2021-09-06 NOTE — Progress Notes (Signed)
Agree with documentation as below.  ___________________________________________ Jacy Brocker L. Lealand Elting, DNP, APRN, FNP-BC Primary Care and Sports Medicine [facility]  

## 2021-09-07 NOTE — Progress Notes (Signed)
Established Patient Office Visit  Subjective   Patient ID: Victoria Hill, transgender female   DOB: [birth year] Age: 36 y.o. MRN: [id number]   Chief Complaint  Patient presents with   Annual Exam   HPI Pleasant 36 year old transgender female presenting today for follow-up.  Has been doing well overall and is compliant with weekly 4 injections of testosterone here in our office.   Review of Systems  Constitutional:  Negative for chills, fever, malaise/fatigue and weight loss.  HENT:  Negative for congestion, ear pain, hearing loss, sinus pain and sore throat.   Eyes:  Negative for blurred vision, photophobia and pain.  Respiratory:  Negative for cough, shortness of breath and wheezing.   Cardiovascular:  Negative for chest pain, palpitations and leg swelling.  Gastrointestinal:  Negative for abdominal pain, constipation, diarrhea, heartburn, nausea and vomiting.  Genitourinary:  Negative for dysuria, frequency and urgency.  Musculoskeletal:  Negative for falls and neck pain.  Skin:  Negative for itching and rash.       Facial and body acne  Neurological:  Negative for dizziness, weakness and headaches.  Endo/Heme/Allergies:  Negative for polydipsia. Does not bruise/bleed easily.  Psychiatric/Behavioral:  Negative for depression, substance abuse and suicidal ideas. The patient is not nervous/anxious and does not have insomnia.    Objective:    Vitals:   09/09/21 0910  BP: 110/73  Pulse: 83  Resp: 20  Height: '5\' 4"'$  (1.626 m)  Weight: 211 lb 3.2 oz (95.8 kg)  SpO2: 98%  BMI (Calculated): 36.23    Physical Exam Vitals and nursing note reviewed.  Constitutional:      General: He is not in acute distress.    Appearance: Normal appearance. He is obese. He is not ill-appearing.  HENT:     Head: Normocephalic and atraumatic.     Right Ear: Tympanic membrane, ear canal and external ear normal. There is no impacted cerumen.     Left Ear: Tympanic membrane, ear canal and external ear  normal. There is no impacted cerumen.     Nose: Nose normal.  Eyes:     General: No scleral icterus.       Right eye: No discharge.        Left eye: No discharge.     Extraocular Movements: Extraocular movements intact.     Conjunctiva/sclera: Conjunctivae normal.     Pupils: Pupils are equal, round, and reactive to light.  Cardiovascular:     Rate and Rhythm: Normal rate and regular rhythm.     Pulses: Normal pulses.     Heart sounds: Normal heart sounds.  Pulmonary:     Effort: Pulmonary effort is normal. No respiratory distress.     Breath sounds: Normal breath sounds. No wheezing, rhonchi or rales.  Abdominal:     General: Bowel sounds are normal. There is no distension.     Palpations: Abdomen is soft. There is no mass.     Tenderness: There is no abdominal tenderness. There is no guarding or rebound.     Hernia: No hernia is present.  Musculoskeletal:        General: Normal range of motion.     Cervical back: Normal range of motion and neck supple.  Lymphadenopathy:     Cervical: No cervical adenopathy.  Skin:    General: Skin is warm and dry.     Findings: Acne present.  Neurological:     Mental Status: He is alert and oriented to person, place, and time.  Psychiatric:  Mood and Affect: Mood normal.        Behavior: Behavior normal.        Thought Content: Thought content normal.        Judgment: Judgment normal.   No results found for this or any previous visit (from the past 24 hour(s)).     The ASCVD Risk score (Arnett DK, et al., 2019) failed to calculate for the following reasons:   The 2019 ASCVD risk score is only valid for ages 50 to 32   Assessment & Plan:   1. Transgender man on hormone therapy Checking labs as below.  Has done very well and been very compliant with injections.  Happy with current therapy and is not having any significant side effects outside of acne related to hormone therapy.  Discussed options for treatment of acne.  Avoiding  spironolactone as this is a testosterone blocker and would affect testosterone levels.  Discussed various over-the-counter options and recommendations for skin moisturizing after showers and regular bathing. - CBC with Differential/Platelet - Lipid panel - Testosterone  2. Anxiety and depression Symptoms well-controlled.  Continue Lexapro 15 mg daily and Wellbutrin 150 mg daily.  3. Immunity status testing Checking for hepatitis B antibodies per patient request. - Hepatitis B Surface AntiBODY  4. Annual physical exam Checking labs as below.  Up-to-date on preventative care.  Wellness information reviewed. - CBC with Differential/Platelet - Lipid panel - COMPLETE METABOLIC PANEL WITH GFR  5. Diabetes mellitus screening Checking A1c. - Hemoglobin A1c  Return in about 6 months (around 03/12/2022) for transgender care.  ___________________________________________ Clearnce Sorrel, DNP, APRN, FNP-BC Primary Care and [place]

## 2021-09-09 ENCOUNTER — Ambulatory Visit: Payer: BC Managed Care – PPO | Admitting: Medical-Surgical

## 2021-09-09 ENCOUNTER — Encounter: Payer: Self-pay | Admitting: Medical-Surgical

## 2021-09-09 VITALS — BP 110/73 | HR 83 | Resp 20 | Ht 64.0 in | Wt 211.2 lb

## 2021-09-09 DIAGNOSIS — Z79899 Other long term (current) drug therapy: Secondary | ICD-10-CM

## 2021-09-09 DIAGNOSIS — F640 Transsexualism: Secondary | ICD-10-CM | POA: Diagnosis not present

## 2021-09-09 DIAGNOSIS — F32A Depression, unspecified: Secondary | ICD-10-CM | POA: Diagnosis not present

## 2021-09-09 DIAGNOSIS — Z0184 Encounter for antibody response examination: Secondary | ICD-10-CM | POA: Diagnosis not present

## 2021-09-09 DIAGNOSIS — F419 Anxiety disorder, unspecified: Secondary | ICD-10-CM

## 2021-09-09 DIAGNOSIS — Z0000 Encounter for general adult medical examination without abnormal findings: Secondary | ICD-10-CM

## 2021-09-09 DIAGNOSIS — Z131 Encounter for screening for diabetes mellitus: Secondary | ICD-10-CM | POA: Diagnosis not present

## 2021-09-12 LAB — COMPLETE METABOLIC PANEL WITH GFR
AG Ratio: 1.8 (calc) (ref 1.0–2.5)
ALT: 35 U/L — ABNORMAL HIGH (ref 6–29)
AST: 18 U/L (ref 10–30)
Albumin: 4.2 g/dL (ref 3.6–5.1)
Alkaline phosphatase (APISO): 48 U/L (ref 31–125)
BUN/Creatinine Ratio: 12 (calc) (ref 6–22)
BUN: 12 mg/dL (ref 7–25)
CO2: 30 mmol/L (ref 20–32)
Calcium: 9.1 mg/dL (ref 8.6–10.2)
Chloride: 104 mmol/L (ref 98–110)
Creat: 1.01 mg/dL — ABNORMAL HIGH (ref 0.50–0.97)
Globulin: 2.4 g/dL (calc) (ref 1.9–3.7)
Glucose, Bld: 92 mg/dL (ref 65–99)
Potassium: 3.9 mmol/L (ref 3.5–5.3)
Sodium: 143 mmol/L (ref 135–146)
Total Bilirubin: 0.7 mg/dL (ref 0.2–1.2)
Total Protein: 6.6 g/dL (ref 6.1–8.1)
eGFR: 73 mL/min/{1.73_m2} (ref >=60)

## 2021-09-12 LAB — CBC WITH DIFFERENTIAL/PLATELET
Absolute Monocytes: 440 cells/uL (ref 200–950)
Basophils Absolute: 31 cells/uL (ref 0–200)
Basophils Relative: 0.5 %
Eosinophils Absolute: 112 cells/uL (ref 15–500)
Eosinophils Relative: 1.8 %
HCT: 46.6 % — ABNORMAL HIGH (ref 35.0–45.0)
Hemoglobin: 15.9 g/dL — ABNORMAL HIGH (ref 11.7–15.5)
Lymphs Abs: 2251 cells/uL (ref 850–3900)
MCH: 30 pg (ref 27.0–33.0)
MCHC: 34.1 g/dL (ref 32.0–36.0)
MCV: 87.9 fL (ref 80.0–100.0)
MPV: 10.1 fL (ref 7.5–12.5)
Monocytes Relative: 7.1 %
Neutro Abs: 3367 cells/uL (ref 1500–7800)
Neutrophils Relative %: 54.3 %
Platelets: 340 10*3/uL (ref 140–400)
RBC: 5.3 10*6/uL — ABNORMAL HIGH (ref 3.80–5.10)
RDW: 13.5 % (ref 11.0–15.0)
Total Lymphocyte: 36.3 %
WBC: 6.2 10*3/uL (ref 3.8–10.8)

## 2021-09-12 LAB — HEMOGLOBIN A1C
Hgb A1c MFr Bld: 5.2 % of total Hgb (ref <5.7)
Mean Plasma Glucose: 103 mg/dL
eAG (mmol/L): 5.7 mmol/L

## 2021-09-12 LAB — LIPID PANEL
Cholesterol: 189 mg/dL (ref <200)
HDL: 37 mg/dL — ABNORMAL LOW (ref >=50)
LDL Cholesterol (Calc): 125 mg/dL (calc) — ABNORMAL HIGH
Non-HDL Cholesterol (Calc): 152 mg/dL (calc) — ABNORMAL HIGH (ref <130)
Total CHOL/HDL Ratio: 5.1 (calc) — ABNORMAL HIGH (ref <5.0)
Triglycerides: 158 mg/dL — ABNORMAL HIGH (ref <150)

## 2021-09-12 LAB — TESTOSTERONE, TOTAL, LC/MS/MS: Testosterone, Total, LC-MS-MS: 798 ng/dL — ABNORMAL HIGH (ref 2–45)

## 2021-09-12 LAB — HEPATITIS B SURFACE ANTIBODY,QUALITATIVE: Hep B S Ab: NONREACTIVE

## 2021-09-13 ENCOUNTER — Ambulatory Visit (INDEPENDENT_AMBULATORY_CARE_PROVIDER_SITE_OTHER): Payer: BC Managed Care – PPO | Admitting: Medical-Surgical

## 2021-09-13 VITALS — BP 120/70 | HR 76

## 2021-09-13 DIAGNOSIS — Z79899 Other long term (current) drug therapy: Secondary | ICD-10-CM

## 2021-09-13 DIAGNOSIS — Z789 Other specified health status: Secondary | ICD-10-CM

## 2021-09-13 DIAGNOSIS — F640 Transsexualism: Secondary | ICD-10-CM | POA: Diagnosis not present

## 2021-09-13 NOTE — Progress Notes (Signed)
Agree with documentation as below.  ___________________________________________ Christipher Rieger L. Jecenia Leamer, DNP, APRN, FNP-BC Primary Care and Sports Medicine [place] [facility]  

## 2021-09-13 NOTE — Progress Notes (Signed)
   Established Patient Office Visit  Subjective   Patient ID: Victoria Hill, adult    DOB: 1985-07-15  Age: 36 y.o. MRN: [id number]  Chief Complaint  Patient presents with   Injections    HPI  Victoria Hill is here for a testosterone injection. Denies chest pain, shortness of breath, headaches or mood changes.   ROS    Objective:     BP 120/70   Pulse 76   SpO2 99%    Physical Exam   No results found for any visits on 09/13/21.    The ASCVD Risk score (Arnett DK, et al., 2019) failed to calculate for the following reasons:   The 2019 ASCVD risk score is only valid for ages 18 to 43    Assessment & Plan:  Testosterone injection - Patient tolerated injection well without complications. Patient advised to schedule next injection 7 days from today.    Problem List Items Addressed This Visit   None Visit Diagnoses     Transgender man on hormone therapy    -  Primary       Return in about 1 week (around 09/20/2021) for testosterone injection. Victoria Hill, Victoria Hill, Victoria Hill

## 2021-09-20 ENCOUNTER — Ambulatory Visit (INDEPENDENT_AMBULATORY_CARE_PROVIDER_SITE_OTHER): Payer: BC Managed Care – PPO | Admitting: Medical-Surgical

## 2021-09-20 VITALS — BP 118/81 | HR 79

## 2021-09-20 DIAGNOSIS — Z79899 Other long term (current) drug therapy: Secondary | ICD-10-CM | POA: Diagnosis not present

## 2021-09-20 DIAGNOSIS — F640 Transsexualism: Secondary | ICD-10-CM

## 2021-09-20 MED ORDER — TESTOSTERONE CYPIONATE 200 MG/ML IM SOLN
100.0000 mg | INTRAMUSCULAR | Status: DC
  Administered 2021-09-20 – 2021-10-05 (×2): 100 mg via INTRAMUSCULAR

## 2021-09-20 NOTE — Progress Notes (Signed)
Agree with documentation as below.  ___________________________________________ Veleka Djordjevic L. Chasyn Cinque, DNP, APRN, FNP-BC Primary Care and Sports Medicine [facility]  

## 2021-09-20 NOTE — Progress Notes (Signed)
HPI:  Patient is here for a testosterone injection.  Denies chest pains, shortness of breath, headaches and problems with medication or mood changes.  Assessment and Plan:  Patient tolerated injection well without complications.  Patient advised to schedule next injection in 7 days.  Charyl Bigger, CMA (AAMA)

## 2021-09-27 ENCOUNTER — Ambulatory Visit (INDEPENDENT_AMBULATORY_CARE_PROVIDER_SITE_OTHER): Payer: BC Managed Care – PPO | Admitting: Family Medicine

## 2021-09-27 VITALS — BP 125/82 | HR 74

## 2021-09-27 DIAGNOSIS — Z79899 Other long term (current) drug therapy: Secondary | ICD-10-CM | POA: Diagnosis not present

## 2021-09-27 DIAGNOSIS — F640 Transsexualism: Secondary | ICD-10-CM | POA: Diagnosis not present

## 2021-09-27 MED ORDER — TESTOSTERONE CYPIONATE 200 MG/ML IM SOLN
100.0000 mg | Freq: Once | INTRAMUSCULAR | Status: AC
  Administered 2021-09-27: 100 mg via INTRAMUSCULAR

## 2021-09-27 NOTE — Progress Notes (Signed)
HPI:  Patient is here for a testosterone injection.  Denies chest pains, shortness of breath, headaches and problems with medication or mood changes.  Assessment and Plan:  Patient tolerated injection well without complications.  Patient advised to schedule next injection in 7 days.  Charyl Bigger, CMA (AAMA)

## 2021-09-27 NOTE — Progress Notes (Signed)
Medical screening examination/treatment was performed by qualified clinical staff member and as supervising physician I was immediately available for consultation/collaboration. I have reviewed documentation and agree with assessment and plan.  Norene Oliveri, DO  

## 2021-10-05 ENCOUNTER — Ambulatory Visit (INDEPENDENT_AMBULATORY_CARE_PROVIDER_SITE_OTHER): Payer: BC Managed Care – PPO | Admitting: Medical-Surgical

## 2021-10-05 VITALS — BP 117/72 | HR 88

## 2021-10-05 DIAGNOSIS — Z79899 Other long term (current) drug therapy: Secondary | ICD-10-CM | POA: Diagnosis not present

## 2021-10-05 DIAGNOSIS — F640 Transsexualism: Secondary | ICD-10-CM | POA: Diagnosis not present

## 2021-10-05 NOTE — Progress Notes (Signed)
   Established Patient Office Visit  Subjective   Patient ID: Victoria Hill, adult    DOB: 1985-05-29  Age: 36 y.o. MRN: [id number]  Chief Complaint  Patient presents with   Injections    HPI  Roderica Cathell is here for a testosterone injection. Denies chest pain, shortness of breath, headaches or mood changes.   ROS    Objective:     BP 117/72   Pulse 88   SpO2 100%    Physical Exam   No results found for any visits on 10/05/21.    The ASCVD Risk score (Arnett DK, et al., 2019) failed to calculate for the following reasons:   The 2019 ASCVD risk score is only valid for ages 32 to 53    Assessment & Plan:  Testosterone injection - Patient tolerated injection well without complications. Patient advised to schedule next injection 7 days from today.    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

## 2021-10-05 NOTE — Progress Notes (Signed)
Agree with documentation as below.  ___________________________________________ Jermine Bibbee L. Abygayle Deltoro, DNP, APRN, FNP-BC Primary Care and Sports Medicine [facility]  

## 2021-10-11 ENCOUNTER — Ambulatory Visit (INDEPENDENT_AMBULATORY_CARE_PROVIDER_SITE_OTHER): Payer: BC Managed Care – PPO | Admitting: Medical-Surgical

## 2021-10-11 VITALS — BP 118/73 | HR 81

## 2021-10-11 DIAGNOSIS — Z79899 Other long term (current) drug therapy: Secondary | ICD-10-CM | POA: Diagnosis not present

## 2021-10-11 DIAGNOSIS — F640 Transsexualism: Secondary | ICD-10-CM | POA: Diagnosis not present

## 2021-10-11 DIAGNOSIS — Z789 Other specified health status: Secondary | ICD-10-CM

## 2021-10-11 NOTE — Progress Notes (Signed)
   Established Patient Office Visit  Subjective   Patient ID: Victoria Hill, adult    DOB: August 24, 1985  Age: 36 y.o. MRN: [id number]  Chief Complaint  Patient presents with   Immunizations    HPI  Victoria Hill is here for a testosterone injection. Denies chest pain, shortness of breath, headaches or mood changes.   ROS    Objective:     BP 118/73   Pulse 81   SpO2 100%    Physical Exam   No results found for any visits on 10/11/21.    The ASCVD Risk score (Arnett DK, et al., 2019) failed to calculate for the following reasons:   The 2019 ASCVD risk score is only valid for ages 73 to 63    Assessment & Plan:  Testosterone injection - Patient tolerated injection well without complications. Patient advised to schedule next injection 7 days from today.    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

## 2021-10-11 NOTE — Progress Notes (Signed)
Agree with documentation as below.  ___________________________________________ Savonna Birchmeier L. Ichelle Harral, DNP, APRN, FNP-BC Primary Care and Sports Medicine [facility]  

## 2021-10-18 ENCOUNTER — Ambulatory Visit: Payer: BC Managed Care – PPO

## 2021-10-19 ENCOUNTER — Ambulatory Visit (INDEPENDENT_AMBULATORY_CARE_PROVIDER_SITE_OTHER): Payer: BC Managed Care – PPO | Admitting: Medical-Surgical

## 2021-10-19 VITALS — BP 134/80 | HR 72

## 2021-10-19 DIAGNOSIS — Z79899 Other long term (current) drug therapy: Secondary | ICD-10-CM | POA: Diagnosis not present

## 2021-10-19 DIAGNOSIS — F640 Transsexualism: Secondary | ICD-10-CM

## 2021-10-19 DIAGNOSIS — Z789 Other specified health status: Secondary | ICD-10-CM | POA: Diagnosis not present

## 2021-10-19 NOTE — Progress Notes (Signed)
   Established Patient Office Visit  Subjective   Patient ID: Victoria Hill, adult    DOB: 1985/07/12  Age: 36 y.o. MRN: [id number]  Chief Complaint  Patient presents with   Injections    HPI  Victoria Hill is here for a testosterone injection. Denies chest pain, shortness of breath, headaches or mood changes.   ROS    Objective:     BP 134/80   Pulse 72   SpO2 100%    Physical Exam   No results found for any visits on 10/19/21.    The ASCVD Risk score (Arnett DK, et al., 2019) failed to calculate for the following reasons:   The 2019 ASCVD risk score is only valid for ages 71 to 65    Assessment & Plan:  Testosterone injection - Patient tolerated injection well without complications. Patient advised to schedule next injection 7 days from today.    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 [name], [name], [place]

## 2021-10-19 NOTE — Progress Notes (Signed)
Agree with documentation as below.  ___________________________________________ Kayliegh Boyers L. Yamil Dougher, DNP, APRN, FNP-BC Primary Care and Sports Medicine [facility]  

## 2021-10-25 ENCOUNTER — Ambulatory Visit (INDEPENDENT_AMBULATORY_CARE_PROVIDER_SITE_OTHER): Payer: BC Managed Care – PPO | Admitting: Medical-Surgical

## 2021-10-25 VITALS — BP 120/69 | HR 76

## 2021-10-25 DIAGNOSIS — Z789 Other specified health status: Secondary | ICD-10-CM

## 2021-10-25 DIAGNOSIS — F640 Transsexualism: Secondary | ICD-10-CM

## 2021-10-25 DIAGNOSIS — Z79899 Other long term (current) drug therapy: Secondary | ICD-10-CM

## 2021-10-25 NOTE — Progress Notes (Signed)
Agree with documentation as below.  ___________________________________________ Kerry Odonohue L. Yanely Mast, DNP, APRN, FNP-BC Primary Care and Sports Medicine [facility]  

## 2021-10-25 NOTE — Progress Notes (Signed)
   Established Patient Office Visit  Subjective   Patient ID: Victoria Hill, adult    DOB: [birth year]  Age: 36 y.o. MRN: [id number]  Chief Complaint  Patient presents with   Injections    HPI  Victoria Hill is here for a testosterone injection. Denies chest pain, shortness of breath, headaches or mood changes.   ROS    Objective:     BP 120/69   Pulse 76   SpO2 99%    Physical Exam   No results found for any visits on 10/25/21.    The ASCVD Risk score (Arnett DK, et al., 2019) failed to calculate for the following reasons:   The 2019 ASCVD risk score is only valid for ages 47 to 54    Assessment & Plan:  Testosterone injection - Patient tolerated injection well without complications. Patient advised to schedule next injection 7 days from today.    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 [name], [name], [place]

## 2021-11-01 ENCOUNTER — Ambulatory Visit (INDEPENDENT_AMBULATORY_CARE_PROVIDER_SITE_OTHER): Payer: BC Managed Care – PPO | Admitting: Medical-Surgical

## 2021-11-01 VITALS — BP 106/60 | HR 73

## 2021-11-01 DIAGNOSIS — F640 Transsexualism: Secondary | ICD-10-CM

## 2021-11-01 DIAGNOSIS — Z79899 Other long term (current) drug therapy: Secondary | ICD-10-CM

## 2021-11-01 MED ORDER — TESTOSTERONE CYPIONATE 200 MG/ML IM SOLN
100.0000 mg | Freq: Once | INTRAMUSCULAR | Status: AC
  Administered 2021-11-01: 100 mg via INTRAMUSCULAR

## 2021-11-01 NOTE — Progress Notes (Signed)
Pt here for testosterone injection no SOB,CP or mood swings. Injection tolerated well given in RUOQ. Pt to RTC 1 week for next injection.

## 2021-11-01 NOTE — Progress Notes (Signed)
Agree with documentation as below.  ___________________________________________ Vayla Wilhelmi L. Meshelle Holness, DNP, APRN, FNP-BC Primary Care and Sports Medicine [facility]  

## 2021-11-08 ENCOUNTER — Ambulatory Visit (INDEPENDENT_AMBULATORY_CARE_PROVIDER_SITE_OTHER): Payer: BC Managed Care – PPO | Admitting: Medical-Surgical

## 2021-11-08 VITALS — BP 115/78 | HR 78 | Temp 98.4°F | Ht 65.0 in | Wt 213.0 lb

## 2021-11-08 DIAGNOSIS — F640 Transsexualism: Secondary | ICD-10-CM

## 2021-11-08 DIAGNOSIS — Z79899 Other long term (current) drug therapy: Secondary | ICD-10-CM | POA: Diagnosis not present

## 2021-11-08 MED ORDER — TESTOSTERONE CYPIONATE 200 MG/ML IM SOLN
100.0000 mg | Freq: Once | INTRAMUSCULAR | Status: AC
  Administered 2021-11-08: 100 mg via INTRAMUSCULAR

## 2021-11-08 MED ORDER — BUPROPION HCL ER (XL) 150 MG PO TB24: 150.0000 mg | ORAL_TABLET | ORAL | 1 refills | Status: DC

## 2021-11-08 NOTE — Progress Notes (Signed)
Agree with documentation as below.  ___________________________________________ Keierra Nudo L. Lania Zawistowski, DNP, APRN, FNP-BC Primary Care and Sports Medicine [facility]  

## 2021-11-08 NOTE — Progress Notes (Signed)
Patient is here for a testosterone injection. Denies chest pain, shortness of breath, headaches and problems with medication or mood changes.   Patient tolerated injection on LUOQ well without complications. Patient advised to schedule next injection in 7 days.

## 2021-11-15 ENCOUNTER — Ambulatory Visit (INDEPENDENT_AMBULATORY_CARE_PROVIDER_SITE_OTHER): Payer: BC Managed Care – PPO | Admitting: Medical-Surgical

## 2021-11-15 VITALS — BP 136/69 | HR 75 | Temp 98.3°F | Ht 65.0 in | Wt 213.1 lb

## 2021-11-15 DIAGNOSIS — Z79899 Other long term (current) drug therapy: Secondary | ICD-10-CM

## 2021-11-15 DIAGNOSIS — F640 Transsexualism: Secondary | ICD-10-CM

## 2021-11-15 MED ORDER — TESTOSTERONE CYPIONATE 200 MG/ML IM SOLN
100.0000 mg | Freq: Once | INTRAMUSCULAR | Status: AC
  Administered 2021-11-15: 100 mg via INTRAMUSCULAR

## 2021-11-15 NOTE — Progress Notes (Signed)
Patient is here for a testosterone injection. Denies chest pain, shortness of breath, headaches and problems with medication or mood changes.   Patient tolerated injection on RUOQ well without complications. Patient advised to schedule next injection in 7 days.

## 2021-11-15 NOTE — Progress Notes (Signed)
Agree with documentation as below.  ___________________________________________ Delonta Yohannes L. Brees Hounshell, DNP, APRN, FNP-BC Primary Care and Sports Medicine [facility]  

## 2021-11-22 ENCOUNTER — Ambulatory Visit (INDEPENDENT_AMBULATORY_CARE_PROVIDER_SITE_OTHER): Payer: BC Managed Care – PPO | Admitting: Medical-Surgical

## 2021-11-22 VITALS — BP 113/64 | HR 70 | Ht 65.0 in

## 2021-11-22 DIAGNOSIS — Z789 Other specified health status: Secondary | ICD-10-CM | POA: Diagnosis not present

## 2021-11-22 DIAGNOSIS — F640 Transsexualism: Secondary | ICD-10-CM | POA: Diagnosis not present

## 2021-11-22 DIAGNOSIS — Z79899 Other long term (current) drug therapy: Secondary | ICD-10-CM

## 2021-11-22 NOTE — Progress Notes (Signed)
   Established Patient Office Visit  Subjective   Patient ID: Victoria Hill, adult    DOB: 1985/10/21  Age: 36 y.o. MRN: [id number]  Chief Complaint  Patient presents with   testosterone injection    Nurse visit - for testosterone injection. Patient denies  chest pain , shortness of breath, headache, mood changes or problems with medication.     HPI  Testosterone injection- patient denies chest pain, shortness of breath, headache,mood changes or problems with medication.   ROS    Objective:     BP 113/64   Pulse 70   Ht '5\' 5"'$  (1.651 m)   SpO2 99%   BMI 35.46 kg/m    Physical Exam   No results found for any visits on 11/22/21.    The ASCVD Risk score (Arnett DK, et al., 2019) failed to calculate for the following reasons:   The 2019 ASCVD risk score is only valid for ages 12 to 12    Assessment & Plan:  Testosterone injection '100mg'$ /ml admin LUOQ - patient tolerated injection well without complications. Will return in 7 days for testosterone injection.  Problem List Items Addressed This Visit   None   Return in about 1 week (around 11/29/2021) for testosterone injection.    Rae Lips, LPN

## 2021-11-22 NOTE — Progress Notes (Signed)
Agree with documentation as below.  ___________________________________________ Summer Parthasarathy L. Yukiko Minnich, DNP, APRN, FNP-BC Primary Care and Sports Medicine [facility]  

## 2021-11-29 ENCOUNTER — Ambulatory Visit (INDEPENDENT_AMBULATORY_CARE_PROVIDER_SITE_OTHER): Payer: BC Managed Care – PPO | Admitting: Medical-Surgical

## 2021-11-29 VITALS — BP 121/72 | HR 78

## 2021-11-29 DIAGNOSIS — Z79899 Other long term (current) drug therapy: Secondary | ICD-10-CM | POA: Diagnosis not present

## 2021-11-29 DIAGNOSIS — F640 Transsexualism: Secondary | ICD-10-CM | POA: Diagnosis not present

## 2021-11-29 MED ORDER — TESTOSTERONE CYPIONATE 200 MG/ML IM SOLN
100.0000 mg | Freq: Once | INTRAMUSCULAR | Status: AC
  Administered 2021-11-29: 100 mg via INTRAMUSCULAR

## 2021-11-29 NOTE — Progress Notes (Signed)
Agree with documentation as below.  ___________________________________________ Markise Haymer L. Darly Fails, DNP, APRN, FNP-BC Primary Care and Sports Medicine [facility]  

## 2021-11-29 NOTE — Progress Notes (Signed)
Pt here for testosterone injection no SOB,CP or mood swings. Injection given in RUOQ pt tolerated well. Pt will RTC in 1 week.

## 2021-12-06 ENCOUNTER — Ambulatory Visit (INDEPENDENT_AMBULATORY_CARE_PROVIDER_SITE_OTHER): Payer: BC Managed Care – PPO | Admitting: Medical-Surgical

## 2021-12-06 VITALS — BP 126/80 | HR 76 | Ht 65.0 in

## 2021-12-06 DIAGNOSIS — Z789 Other specified health status: Secondary | ICD-10-CM

## 2021-12-06 DIAGNOSIS — F640 Transsexualism: Secondary | ICD-10-CM | POA: Diagnosis not present

## 2021-12-06 DIAGNOSIS — Z79899 Other long term (current) drug therapy: Secondary | ICD-10-CM | POA: Diagnosis not present

## 2021-12-06 NOTE — Progress Notes (Signed)
Agree with documentation as below.  ___________________________________________ Dalyn Kjos L. Kynslei Art, DNP, APRN, FNP-BC Primary Care and Sports Medicine [facility]  

## 2021-12-06 NOTE — Patient Instructions (Signed)
Patient to return in 7 days for testosterone injection.

## 2021-12-06 NOTE — Progress Notes (Signed)
   Established Patient Office Visit  Subjective   Patient ID: Victoria Hill, adult    DOB: [birth year]  Age: 36 y.o. MRN: [id number]  Chief Complaint  Patient presents with   testosterone injection    Nurse visit - testosterone injection     HPI  Testosterone injection- patient denies shortness of breath, mood changes, palpiatitons or problems with medication.   ROS    Objective:     BP 126/80   Pulse 76   Ht '5\' 5"'$  (1.651 m)   SpO2 99%   BMI 35.46 kg/m    Physical Exam   No results found for any visits on 12/06/21.    The ASCVD Risk score (Arnett DK, et al., 2019) failed to calculate for the following reasons:   The 2019 ASCVD risk score is only valid for ages 28 to 32    Assessment & Plan:  Testosterone injection- patient tolerated injection well without complications. Patient will return in one week for nurse visit testosterone injection.  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    Rae Lips, LPN

## 2021-12-13 ENCOUNTER — Ambulatory Visit (INDEPENDENT_AMBULATORY_CARE_PROVIDER_SITE_OTHER): Payer: BC Managed Care – PPO | Admitting: Medical-Surgical

## 2021-12-13 VITALS — BP 105/68 | HR 80 | Ht 65.0 in

## 2021-12-13 DIAGNOSIS — Z79899 Other long term (current) drug therapy: Secondary | ICD-10-CM

## 2021-12-13 DIAGNOSIS — Z789 Other specified health status: Secondary | ICD-10-CM | POA: Diagnosis not present

## 2021-12-13 DIAGNOSIS — F640 Transsexualism: Secondary | ICD-10-CM

## 2021-12-13 NOTE — Progress Notes (Signed)
   Established Patient Office Visit  Subjective   Patient ID: Victoria Hill, adult    DOB: 27-Feb-1985  Age: 36 y.o. MRN: [id number]  Chief Complaint  Patient presents with   testosterone injection    Nurse visit    HPI  Testosterone injection- pt denies chest pain, mood swings, palpitations or problems with medicaiton.   ROS    Objective:     BP 105/68   Pulse 80   Ht '5\' 5"'$  (1.651 m)   SpO2 97%   BMI 35.46 kg/m    Physical Exam   No results found for any visits on 12/13/21.    The ASCVD Risk score (Arnett DK, et al., 2019) failed to calculate for the following reasons:   The 2019 ASCVD risk score is only valid for ages 58 to 23    Assessment & Plan:  Testosterone injection- injection given-patient tolerated well without complications.  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    Rae Lips, LPN

## 2021-12-13 NOTE — Patient Instructions (Signed)
Schedule a nurse visit for testosterone injection in 7 days.

## 2021-12-13 NOTE — Progress Notes (Signed)
Agree with documentation as below.  ___________________________________________ Caroly Purewal L. Latroya Ng, DNP, APRN, FNP-BC Primary Care and Sports Medicine [facility]  

## 2021-12-21 ENCOUNTER — Other Ambulatory Visit: Payer: Self-pay

## 2021-12-21 ENCOUNTER — Ambulatory Visit (INDEPENDENT_AMBULATORY_CARE_PROVIDER_SITE_OTHER): Payer: BC Managed Care – PPO | Admitting: Medical-Surgical

## 2021-12-21 VITALS — BP 122/77 | HR 72 | Ht 65.0 in

## 2021-12-21 DIAGNOSIS — Z789 Other specified health status: Secondary | ICD-10-CM

## 2021-12-21 DIAGNOSIS — Z79899 Other long term (current) drug therapy: Secondary | ICD-10-CM | POA: Diagnosis not present

## 2021-12-21 DIAGNOSIS — F640 Transsexualism: Secondary | ICD-10-CM

## 2021-12-21 MED ORDER — ESCITALOPRAM OXALATE 5 MG PO TABS: 5.0000 mg | ORAL_TABLET | Freq: Every day | ORAL | 0 refills | Status: DC

## 2021-12-21 NOTE — Patient Instructions (Signed)
Return in 7 days for nurse visit for testosterone injection.

## 2021-12-21 NOTE — Progress Notes (Signed)
Agree with documentation as below.  ___________________________________________ Caytlyn Evers L. Chetan Mehring, DNP, APRN, FNP-BC Primary Care and Sports Medicine [facility]  

## 2021-12-21 NOTE — Progress Notes (Signed)
   Established Patient Office Visit  Subjective   Patient ID: Victoria Hill, adult    DOB: [birth year]  Age: 36 y.o. MRN: [id number]  Chief Complaint  Patient presents with   testosterone injection     Nurse visit    HPI  Testosterone injection- patient denies chest pain, shortness of breath, palpitations, mood changes or problems with medication.   ROS    Objective:     BP 122/77   Pulse 72   Ht '5\' 5"'$  (1.651 m)   SpO2 99%   BMI 35.46 kg/m    Physical Exam   No results found for any visits on 12/21/21.    The ASCVD Risk score (Arnett DK, et al., 2019) failed to calculate for the following reasons:   The 2019 ASCVD risk score is only valid for ages 35 to 62    Assessment & Plan:  Testosterone injection- admin injection LUOQ -  patient tolerated the  injection well without complications. Return in 7 days for testosterone injection as nurse visit.  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    Rae Lips, LPN

## 2021-12-27 ENCOUNTER — Ambulatory Visit (INDEPENDENT_AMBULATORY_CARE_PROVIDER_SITE_OTHER): Payer: BC Managed Care – PPO | Admitting: Family Medicine

## 2021-12-27 VITALS — BP 128/79 | HR 73 | Resp 20 | Ht 65.0 in | Wt 213.8 lb

## 2021-12-27 DIAGNOSIS — F640 Transsexualism: Secondary | ICD-10-CM | POA: Diagnosis not present

## 2021-12-27 DIAGNOSIS — Z789 Other specified health status: Secondary | ICD-10-CM

## 2021-12-27 DIAGNOSIS — Z79899 Other long term (current) drug therapy: Secondary | ICD-10-CM | POA: Diagnosis not present

## 2021-12-27 NOTE — Progress Notes (Signed)
   Subjective:    Patient ID: Victoria Hill, adult    DOB: [birth year], 36 y.o.   MRN: [id number]  HPI  Patient here for a testosterone injection. Denies chest pain, shortness of breath, headaches and problems with medication or mood changes.  Review of Systems     Objective:   Physical Exam        Assessment & Plan:   Patient tolerated injection well without complications. Patient advised to schedule next injection in 7 days.  Location: RUOQ

## 2021-12-27 NOTE — Progress Notes (Signed)
Medical screening examination/treatment was performed by qualified clinical staff member and as supervising physician I was immediately available for consultation/collaboration. I have reviewed documentation and agree with assessment and plan.  Aailyah Dunbar, DO  

## 2022-01-03 ENCOUNTER — Ambulatory Visit (INDEPENDENT_AMBULATORY_CARE_PROVIDER_SITE_OTHER): Payer: BC Managed Care – PPO | Admitting: Medical-Surgical

## 2022-01-03 VITALS — BP 126/76 | HR 83 | Ht 65.0 in | Wt 210.0 lb

## 2022-01-03 DIAGNOSIS — Z789 Other specified health status: Secondary | ICD-10-CM

## 2022-01-03 DIAGNOSIS — F640 Transsexualism: Secondary | ICD-10-CM

## 2022-01-03 DIAGNOSIS — Z79899 Other long term (current) drug therapy: Secondary | ICD-10-CM

## 2022-01-03 NOTE — Progress Notes (Signed)
   Subjective:    Patient ID: Victoria Hill, adult    DOB: 28-May-1985, 36 y.o.   MRN: [id number]  HPI Patient is here for a testosterone injection. Denies chest pain, shortness of breath, headaches and problems with medication or mood changes.    Review of Systems     Objective:   Physical Exam        Assessment & Plan:  Patient tolerated injection well without complications in the LUOQ. Patient advised to schedule next injection in 7 days.

## 2022-01-03 NOTE — Progress Notes (Signed)
Agree with documentation as below.  ___________________________________________ Michelle Vanhise L. Idalis Hoelting, DNP, APRN, FNP-BC Primary Care and Sports Medicine  [facility]  

## 2022-01-10 ENCOUNTER — Ambulatory Visit (INDEPENDENT_AMBULATORY_CARE_PROVIDER_SITE_OTHER): Payer: BC Managed Care – PPO | Admitting: Medical-Surgical

## 2022-01-10 VITALS — BP 138/79 | HR 85 | Ht 65.0 in | Wt 214.0 lb

## 2022-01-10 DIAGNOSIS — Z789 Other specified health status: Secondary | ICD-10-CM | POA: Diagnosis not present

## 2022-01-10 DIAGNOSIS — F640 Transsexualism: Secondary | ICD-10-CM

## 2022-01-10 DIAGNOSIS — Z79899 Other long term (current) drug therapy: Secondary | ICD-10-CM | POA: Diagnosis not present

## 2022-01-10 MED ORDER — TESTOSTERONE CYPIONATE 200 MG/ML IM SOLN
100.0000 mg | INTRAMUSCULAR | Status: AC
  Administered 2022-01-10: 100 mg via INTRAMUSCULAR

## 2022-01-10 NOTE — Progress Notes (Signed)
Agree with documentation as below.  ___________________________________________ Shamiah Kahler L. Janiel Derhammer, DNP, APRN, FNP-BC Primary Care and Sports Medicine [facility]  

## 2022-01-10 NOTE — Progress Notes (Signed)
   Subjective:    Patient ID: Victoria Hill, adult    DOB: 04-25-1985, 36 y.o.   MRN: [id number]  HPI Pt is here for a testosterone injection. Denies chest pain, shortness of breath, headaches and problems with medication or mood changes.   Review of Systems     Objective:   Physical Exam        Assessment & Plan:   Pt tolerated injection well '100mg'$ , RUOQ without complications. Pt advised to return in 7 days for next injection.

## 2022-01-17 ENCOUNTER — Ambulatory Visit (INDEPENDENT_AMBULATORY_CARE_PROVIDER_SITE_OTHER): Payer: BC Managed Care – PPO | Admitting: Medical-Surgical

## 2022-01-17 VITALS — BP 131/83 | HR 76

## 2022-01-17 DIAGNOSIS — Z79899 Other long term (current) drug therapy: Secondary | ICD-10-CM

## 2022-01-17 DIAGNOSIS — F640 Transsexualism: Secondary | ICD-10-CM

## 2022-01-17 DIAGNOSIS — Z789 Other specified health status: Secondary | ICD-10-CM | POA: Diagnosis not present

## 2022-01-17 NOTE — Progress Notes (Signed)
   Established Patient Office Visit  Subjective   Patient ID: Victoria Hill, adult    DOB: [birth year]  Age: 36 y.o. MRN: [id number]  Chief Complaint  Patient presents with   Injections    HPI  Victoria Hill is here for a testosterone injection. Denies chest pain, shortness of breath, headaches or mood changes.    ROS    Objective:     BP 131/83   Pulse 76   SpO2 100%    Physical Exam   No results found for any visits on 01/17/22.    The ASCVD Risk score (Arnett DK, et al., 2019) failed to calculate for the following reasons:   The 2019 ASCVD risk score is only valid for ages 91 to 7    Assessment & Plan:  Testosterone injection - Patient tolerated injection well without complications. Patient advised to schedule next injection 7 days from today.    Problem List Items Addressed This Visit   None Visit Diagnoses     Female-to-female transgender person    -  Primary       Return in about 1 week (around 01/24/2022) for testosterone injection. [name], [name], [place]

## 2022-01-17 NOTE — Progress Notes (Signed)
Agree with documentation as below.  ___________________________________________ Demonie Kassa L. Kacelyn Rowzee, DNP, APRN, FNP-BC Primary Care and Sports Medicine [facility]  

## 2022-01-25 ENCOUNTER — Ambulatory Visit (INDEPENDENT_AMBULATORY_CARE_PROVIDER_SITE_OTHER): Payer: BC Managed Care – PPO | Admitting: Medical-Surgical

## 2022-01-25 VITALS — BP 118/71 | HR 77 | Ht 65.0 in | Wt 214.0 lb

## 2022-01-25 DIAGNOSIS — Z789 Other specified health status: Secondary | ICD-10-CM | POA: Diagnosis not present

## 2022-01-25 DIAGNOSIS — F640 Transsexualism: Secondary | ICD-10-CM | POA: Diagnosis not present

## 2022-01-25 DIAGNOSIS — Z79899 Other long term (current) drug therapy: Secondary | ICD-10-CM | POA: Diagnosis not present

## 2022-01-25 NOTE — Progress Notes (Signed)
Agree with documentation as below.  ___________________________________________ Giovanni Biby L. Trelon Plush, DNP, APRN, FNP-BC Primary Care and Sports Medicine [facility]  

## 2022-01-25 NOTE — Progress Notes (Signed)
Patient is here for a testosterone injection of '100mg'$ . Given in [place].  Denies chest pain, shortness of breath, headaches and problems with medication or mood changes.  Tolerated injection well without complications.   Patient advised to schedule next injection in 7 days.

## 2022-01-31 ENCOUNTER — Ambulatory Visit (INDEPENDENT_AMBULATORY_CARE_PROVIDER_SITE_OTHER): Payer: BC Managed Care – PPO | Admitting: Medical-Surgical

## 2022-01-31 VITALS — BP 131/76 | HR 75 | Ht 65.0 in | Wt 214.0 lb

## 2022-01-31 DIAGNOSIS — F640 Transsexualism: Secondary | ICD-10-CM

## 2022-01-31 DIAGNOSIS — Z79899 Other long term (current) drug therapy: Secondary | ICD-10-CM

## 2022-01-31 MED ORDER — TESTOSTERONE CYPIONATE 200 MG/ML IM SOLN
100.0000 mg | INTRAMUSCULAR | Status: AC
  Administered 2022-01-31: 100 mg via INTRAMUSCULAR

## 2022-01-31 NOTE — Progress Notes (Signed)
Agree with documentation as below.  ___________________________________________ Treniyah Lynn L. Jaivon Vanbeek, DNP, APRN, FNP-BC Primary Care and Sports Medicine [facility]  

## 2022-01-31 NOTE — Progress Notes (Signed)
   Subjective:    Patient ID: Victoria Hill, adult    DOB: [birth year], 37 y.o.   MRN: [id number]  HPI Pt here for testosterone injection, pt denies any chest pain, shortness of breath, headache and problems with medication or mood swings.    Review of Systems     Objective:   Physical Exam        Assessment & Plan:   Pt tolerated injection of testosterone '100mg'$  ULQ well without complications. Pt advised to schedule next injection in 1 week.

## 2022-02-07 ENCOUNTER — Ambulatory Visit: Payer: BC Managed Care – PPO

## 2022-02-09 ENCOUNTER — Ambulatory Visit (INDEPENDENT_AMBULATORY_CARE_PROVIDER_SITE_OTHER): Payer: BC Managed Care – PPO | Admitting: Medical-Surgical

## 2022-02-09 VITALS — BP 128/82 | HR 77 | Ht 65.0 in | Wt 215.0 lb

## 2022-02-09 DIAGNOSIS — F419 Anxiety disorder, unspecified: Secondary | ICD-10-CM | POA: Diagnosis not present

## 2022-02-09 DIAGNOSIS — F32A Depression, unspecified: Secondary | ICD-10-CM | POA: Diagnosis not present

## 2022-02-09 DIAGNOSIS — F640 Transsexualism: Secondary | ICD-10-CM

## 2022-02-09 DIAGNOSIS — Z79899 Other long term (current) drug therapy: Secondary | ICD-10-CM | POA: Diagnosis not present

## 2022-02-09 MED ORDER — TESTOSTERONE CYPIONATE 200 MG/ML IM SOLN
100.0000 mg | INTRAMUSCULAR | Status: AC
  Administered 2022-02-09: 100 mg via INTRAMUSCULAR

## 2022-02-09 MED ORDER — BUPROPION HCL ER (XL) 150 MG PO TB24: 150.0000 mg | ORAL_TABLET | ORAL | 1 refills | Status: DC

## 2022-02-09 NOTE — Progress Notes (Signed)
   Subjective:    Patient ID: Victoria Hill, adult    DOB: 07-20-1985, 37 y.o.   MRN: [id number]  HPI Pt here for testosterone injection, pt denies chest pain, shortness of breath, headaches and problems with medication.    Review of Systems     Objective:   Physical Exam        Assessment & Plan:  Pt tolerated injection well in RUOQ without any complications. Pt advised to schedule next injection in 7 days.

## 2022-02-09 NOTE — Progress Notes (Signed)
Agree with documentation as below.  ___________________________________________ Ural Acree L. Tarita Deshmukh, DNP, APRN, FNP-BC Primary Care and Sports Medicine  [facility]   

## 2022-02-14 ENCOUNTER — Ambulatory Visit: Payer: BC Managed Care – PPO

## 2022-02-16 ENCOUNTER — Ambulatory Visit (INDEPENDENT_AMBULATORY_CARE_PROVIDER_SITE_OTHER): Payer: BC Managed Care – PPO | Admitting: Medical-Surgical

## 2022-02-16 VITALS — BP 128/82 | HR 75 | Ht 65.0 in | Wt 213.0 lb

## 2022-02-16 DIAGNOSIS — Z789 Other specified health status: Secondary | ICD-10-CM | POA: Diagnosis not present

## 2022-02-16 MED ORDER — TESTOSTERONE CYPIONATE 200 MG/ML IM SOLN
100.0000 mg | INTRAMUSCULAR | Status: DC
  Administered 2022-02-16 – 2022-03-02 (×2): 100 mg via INTRAMUSCULAR

## 2022-02-16 NOTE — Progress Notes (Signed)
   Subjective:    Patient ID: Victoria Hill, adult    DOB: 04-11-1985, 37 y.o.   MRN: [id number]  HPI Pt here for testosterone injection.  Review of Systems     Objective:   Physical Exam        Assessment & Plan:  Pt tolerated injection well in LUOQ, pt denies any complications. Pt advised to schedule next injection in 14 days.

## 2022-02-16 NOTE — Progress Notes (Signed)
Agree with documentation as below.  ___________________________________________ Gwendolyn Nishi L. Rhet Rorke, DNP, APRN, FNP-BC Primary Care and Sports Medicine [facility]  

## 2022-02-23 ENCOUNTER — Ambulatory Visit (INDEPENDENT_AMBULATORY_CARE_PROVIDER_SITE_OTHER): Payer: BC Managed Care – PPO | Admitting: Medical-Surgical

## 2022-02-23 VITALS — BP 129/88 | HR 75 | Ht 65.0 in | Wt 215.0 lb

## 2022-02-23 DIAGNOSIS — F640 Transsexualism: Secondary | ICD-10-CM

## 2022-02-23 DIAGNOSIS — Z789 Other specified health status: Secondary | ICD-10-CM | POA: Diagnosis not present

## 2022-02-23 DIAGNOSIS — Z79899 Other long term (current) drug therapy: Secondary | ICD-10-CM

## 2022-02-23 NOTE — Progress Notes (Signed)
Agree with documentation as below.  ___________________________________________ Kaliana Albino L. Pieper Kasik, DNP, APRN, FNP-BC Primary Care and Sports Medicine  [facility]  

## 2022-02-23 NOTE — Progress Notes (Signed)
Patient is here for a testosterone injection of '100mg'$ /0.40m.  Location: RUOQ.  Denies chest pain, shortness of breath, headaches and problems with medication or mood changes.  Tolerated injection well without complications.   Patient advised to schedule next injection in 7 days.

## 2022-03-02 ENCOUNTER — Ambulatory Visit (INDEPENDENT_AMBULATORY_CARE_PROVIDER_SITE_OTHER): Payer: BC Managed Care – PPO | Admitting: Medical-Surgical

## 2022-03-02 DIAGNOSIS — F640 Transsexualism: Secondary | ICD-10-CM

## 2022-03-02 DIAGNOSIS — Z79899 Other long term (current) drug therapy: Secondary | ICD-10-CM

## 2022-03-02 DIAGNOSIS — Z789 Other specified health status: Secondary | ICD-10-CM | POA: Diagnosis not present

## 2022-03-02 MED ORDER — TESTOSTERONE CYPIONATE 200 MG/ML IM SOLN
100.0000 mg | Freq: Once | INTRAMUSCULAR | Status: AC
  Administered 2022-03-30: 100 mg via INTRAMUSCULAR

## 2022-03-02 NOTE — Progress Notes (Signed)
   Subjective:    Patient ID: Victoria Hill, adult    DOB: 1985/03/28, 37 y.o.   MRN: [id number]  HPI Pt here for testosterone injection, pt denies chest pain, sob, mood changes, headache or problems with medication.    Review of Systems     Objective:   Physical Exam        Assessment & Plan:   Pt tolerated injection in RUOQ well without any complications. Pt advised to schedule next injection in 7 days.

## 2022-03-02 NOTE — Progress Notes (Signed)
Agree with documentation as below.  ___________________________________________ Melissa Pulido L. Marquasha Brutus, DNP, APRN, FNP-BC Primary Care and Sports Medicine [facility]  

## 2022-03-09 ENCOUNTER — Ambulatory Visit (INDEPENDENT_AMBULATORY_CARE_PROVIDER_SITE_OTHER): Payer: BC Managed Care – PPO | Admitting: Medical-Surgical

## 2022-03-09 VITALS — BP 145/79 | HR 80 | Ht 65.0 in | Wt 218.0 lb

## 2022-03-09 DIAGNOSIS — F640 Transsexualism: Secondary | ICD-10-CM

## 2022-03-09 DIAGNOSIS — Z79899 Other long term (current) drug therapy: Secondary | ICD-10-CM | POA: Diagnosis not present

## 2022-03-09 MED ORDER — TESTOSTERONE CYPIONATE 200 MG/ML IM SOLN
100.0000 mg | Freq: Once | INTRAMUSCULAR | Status: AC
  Administered 2022-03-09: 100 mg via INTRAMUSCULAR

## 2022-03-09 NOTE — Progress Notes (Signed)
   Subjective:    Patient ID: Victoria Hill, adult    DOB: [birth year], 37 y.o.   MRN: [id number]  HPI Pt here for testosterone injection, pt denies chest pain, sob, mood changes or problems with medication.    Review of Systems     Objective:   Physical Exam        Assessment & Plan:   Pt tolerated injection in LUOQ well without any complications. Pt advised to schedule next injection in 7 days.

## 2022-03-09 NOTE — Progress Notes (Signed)
Agree with documentation as below.  ___________________________________________ Payden Bonus L. Allan Minotti, DNP, APRN, FNP-BC Primary Care and Sports Medicine [facility]  

## 2022-03-13 ENCOUNTER — Ambulatory Visit: Payer: BC Managed Care – PPO | Admitting: Medical-Surgical

## 2022-03-13 ENCOUNTER — Encounter: Payer: Self-pay | Admitting: Medical-Surgical

## 2022-03-13 VITALS — BP 136/87 | HR 100 | Temp 98.4°F | Resp 20 | Ht 65.0 in | Wt 216.1 lb

## 2022-03-13 DIAGNOSIS — F640 Transsexualism: Secondary | ICD-10-CM

## 2022-03-13 DIAGNOSIS — J069 Acute upper respiratory infection, unspecified: Secondary | ICD-10-CM

## 2022-03-13 DIAGNOSIS — F32A Depression, unspecified: Secondary | ICD-10-CM

## 2022-03-13 DIAGNOSIS — Z79899 Other long term (current) drug therapy: Secondary | ICD-10-CM

## 2022-03-13 DIAGNOSIS — F419 Anxiety disorder, unspecified: Secondary | ICD-10-CM

## 2022-03-13 LAB — POC COVID19 BINAXNOW: SARS Coronavirus 2 Ag: NEGATIVE

## 2022-03-13 LAB — POCT INFLUENZA A/B
Influenza A, POC: NEGATIVE
Influenza B, POC: NEGATIVE

## 2022-03-13 MED ORDER — BUPROPION HCL ER (XL) 150 MG PO TB24: 150.0000 mg | ORAL_TABLET | ORAL | 3 refills | Status: DC

## 2022-03-13 MED ORDER — ESCITALOPRAM OXALATE 5 MG PO TABS: 5.0000 mg | ORAL_TABLET | Freq: Every day | ORAL | 3 refills | Status: DC

## 2022-03-13 MED ORDER — ESCITALOPRAM OXALATE 10 MG PO TABS: 10.0000 mg | ORAL_TABLET | Freq: Every day | ORAL | 3 refills | Status: DC

## 2022-03-13 NOTE — Progress Notes (Signed)
Established Patient Office Visit  Subjective   Patient ID: Victoria Hill, transgender female   DOB: 07-Mar-1985 Age: 37 y.o. MRN: [id number]   Chief Complaint  Patient presents with   Cough   Sinusitis   Sore Throat    HPI Very pleasant 37 year old transgender female presenting today for follow-up on:  HRT: Has been doing well with weekly testosterone injections of 100 mg IM.  Happy with the results of the medication and has no concerns or intolerances.  Status post hysterectomy so no concerns for birth control.  Denies risk for STIs and reports being asexual so no concern with low libido.  Mood: Feels that his mood is overall very stable.  Taking Lexapro 15 mg daily and Wellbutrin 150 mg daily.  Tolerating both medications well with no side effects.  Denies SI/HI.  Reports developing some upper respiratory symptoms that started on Thursday evening/Friday morning.  Has had sinus drainage, headache, sore throat, chest congestion, and a cough.  Has been seeing small amounts of greenish-yellow nasal discharge and sputum when coughing.  Has been using Tylenol Sinus which is helping tremendously with symptoms.   Objective:    Vitals:   03/13/22 0953  BP: 136/87  Pulse: 100  Temp: 98.4 F (36.9 C)  Resp: 20  Height: 5' 5"$  (1.651 m)  Weight: 216 lb 1.6 oz (98 kg)  SpO2: 98%  BMI (Calculated): 35.96    Physical Exam Vitals and nursing note reviewed.  Constitutional:      General: He is not in acute distress.    Appearance: Normal appearance. He is well-developed. He is not ill-appearing.  HENT:     Head: Normocephalic and atraumatic.  Cardiovascular:     Rate and Rhythm: Normal rate and regular rhythm.     Pulses: Normal pulses.     Heart sounds: Normal heart sounds.  Pulmonary:     Effort: Pulmonary effort is normal. No respiratory distress.     Breath sounds: Normal breath sounds. No wheezing, rhonchi or rales.  Skin:    General: Skin is warm and dry.  Neurological:     Mental  Status: He is alert and oriented to person, place, and time.  Psychiatric:        Mood and Affect: Mood normal.        Behavior: Behavior normal.        Thought Content: Thought content normal.        Judgment: Judgment normal.    Results for orders placed or performed in visit on 03/13/22 (from the past 24 hour(s))  POC COVID-19     Status: None   Collection Time: 03/13/22 10:45 AM  Result Value Ref Range   SARS Coronavirus 2 Ag Negative Negative  POCT Influenza A/B     Status: None   Collection Time: 03/13/22 10:46 AM  Result Value Ref Range   Influenza A, POC Negative Negative   Influenza B, POC Negative Negative       The ASCVD Risk score (Arnett DK, et al., 2019) failed to calculate for the following reasons:   The 2019 ASCVD risk score is only valid for ages 57 to 77   Assessment & Plan:   1. 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 Continue testosterone 155m IM weekly. Titrate depending on results.  - CBC with Differential/Platelet - Lipid panel - Testosterone  2. Anxiety and depression Stable. Continue Lexapro and Wellbutrin as prescribed.  - buPROPion (WELLBUTRIN XL) 150 MG 24 hr tablet; Take 1  tablet (150 mg total) by mouth every morning.  Dispense: 90 tablet; Refill: 3  3. Viral upper respiratory tract infection POCT flu and COVID negative. With only 4 days of symptoms, likely viral etiology. Discussed symptom management using OTC cold and flu preparations. If not improving by the end of the week, advised to let us know for further recommendations. - POCT Influenza A/B - POC COVID-19  Return in about 6 months (around 09/11/2022) for HRT/mood follow up.  ___________________________________________ Clearnce Sorrel, DNP, APRN, FNP-BC Primary [facility] and [place]

## 2022-03-16 ENCOUNTER — Ambulatory Visit (INDEPENDENT_AMBULATORY_CARE_PROVIDER_SITE_OTHER): Payer: BC Managed Care – PPO | Admitting: Medical-Surgical

## 2022-03-16 VITALS — BP 126/84 | HR 80 | Ht 65.0 in | Wt 218.0 lb

## 2022-03-16 DIAGNOSIS — F640 Transsexualism: Secondary | ICD-10-CM

## 2022-03-16 DIAGNOSIS — Z79899 Other long term (current) drug therapy: Secondary | ICD-10-CM | POA: Diagnosis not present

## 2022-03-16 LAB — LIPID PANEL
Cholesterol: 209 mg/dL — ABNORMAL HIGH (ref <200)
HDL: 36 mg/dL — ABNORMAL LOW (ref >=50)
LDL Cholesterol (Calc): 138 mg/dL (calc) — ABNORMAL HIGH
Non-HDL Cholesterol (Calc): 173 mg/dL (calc) — ABNORMAL HIGH (ref <130)
Total CHOL/HDL Ratio: 5.8 (calc) — ABNORMAL HIGH (ref <5.0)
Triglycerides: 201 mg/dL — ABNORMAL HIGH (ref <150)

## 2022-03-16 LAB — CBC WITH DIFFERENTIAL/PLATELET
Absolute Monocytes: 594 cells/uL (ref 200–950)
Basophils Absolute: 39 cells/uL (ref 0–200)
Basophils Relative: 0.7 %
Eosinophils Absolute: 132 cells/uL (ref 15–500)
Eosinophils Relative: 2.4 %
HCT: 49.5 % — ABNORMAL HIGH (ref 35.0–45.0)
Hemoglobin: 17 g/dL — ABNORMAL HIGH (ref 11.7–15.5)
Lymphs Abs: 1232 cells/uL (ref 850–3900)
MCH: 30.4 pg (ref 27.0–33.0)
MCHC: 34.3 g/dL (ref 32.0–36.0)
MCV: 88.6 fL (ref 80.0–100.0)
MPV: 10.1 fL (ref 7.5–12.5)
Monocytes Relative: 10.8 %
Neutro Abs: 3504 cells/uL (ref 1500–7800)
Neutrophils Relative %: 63.7 %
Platelets: 341 10*3/uL (ref 140–400)
RBC: 5.59 10*6/uL — ABNORMAL HIGH (ref 3.80–5.10)
RDW: 13.4 % (ref 11.0–15.0)
Total Lymphocyte: 22.4 %
WBC: 5.5 10*3/uL (ref 3.8–10.8)

## 2022-03-16 LAB — TESTOSTERONE, TOTAL, LC/MS/MS: Testosterone, Total, LC-MS-MS: 359 ng/dL — ABNORMAL HIGH (ref 2–45)

## 2022-03-16 MED ORDER — TESTOSTERONE CYPIONATE 200 MG/ML IM SOLN
100.0000 mg | INTRAMUSCULAR | Status: DC
  Administered 2022-03-16: 100 mg via INTRAMUSCULAR

## 2022-03-16 NOTE — Progress Notes (Signed)
   Subjective:    Patient ID: Victoria Hill, adult    DOB: [birth year], 37 y.o.   MRN: [id number]  HPI Pt here for testosterone injection, pt denies chest pain, sob, mood changes or problems with medication.      Review of Systems     Objective:   Physical Exam        Assessment & Plan:   Pt tolerated injection in RUOQ well without any complications. Pt advised to schedule next injection in 7 days.

## 2022-03-17 DIAGNOSIS — F640 Transsexualism: Secondary | ICD-10-CM | POA: Diagnosis not present

## 2022-03-17 DIAGNOSIS — Z79899 Other long term (current) drug therapy: Secondary | ICD-10-CM | POA: Diagnosis not present

## 2022-03-17 MED ORDER — TESTOSTERONE CYPIONATE 200 MG/ML IM SOLN
100.0000 mg | Freq: Once | INTRAMUSCULAR | Status: AC
  Administered 2022-03-17: 100 mg via INTRAMUSCULAR

## 2022-03-17 MED ORDER — TESTOSTERONE CYPIONATE 100 MG/ML IM SOLN: 100.0000 mg | Freq: Once | INTRAMUSCULAR | Status: DC

## 2022-03-17 NOTE — Addendum Note (Signed)
Addended by: Mertha Finders on: 03/17/2022 02:19 PM   Modules accepted: Orders

## 2022-03-17 NOTE — Progress Notes (Signed)
Pt tolerated injection in RUOQ well without any complications. Pt advised to schedule next injection in 7 days.

## 2022-03-23 ENCOUNTER — Ambulatory Visit (INDEPENDENT_AMBULATORY_CARE_PROVIDER_SITE_OTHER): Payer: BC Managed Care – PPO | Admitting: Medical-Surgical

## 2022-03-23 VITALS — BP 137/85 | HR 79 | Ht 65.0 in | Wt 218.0 lb

## 2022-03-23 DIAGNOSIS — F640 Transsexualism: Secondary | ICD-10-CM | POA: Diagnosis not present

## 2022-03-23 MED ORDER — TESTOSTERONE CYPIONATE 200 MG/ML IM SOLN
100.0000 mg | INTRAMUSCULAR | Status: AC
  Administered 2022-03-23: 100 mg via INTRAMUSCULAR

## 2022-03-23 NOTE — Progress Notes (Signed)
Agree with documentation as below.  ___________________________________________ Chrishauna Mee L. Vaniyah Lansky, DNP, APRN, FNP-BC Primary Care and Sports Medicine [facility]  

## 2022-03-23 NOTE — Progress Notes (Signed)
   Subjective:    Patient ID: Victoria Hill, adult    DOB: 1985/07/12, 37 y.o.   MRN: [id number]  HPI Pt here for testosterone injection, pt denies chest pain, shortness of breath, headaches, mood changes or problems with medication.    Review of Systems     Objective:   Physical Exam        Assessment & Plan:   Pt tolerated injection well in LUOQ without complications. Pt advised to schedule next injection in 1 week for testosterone injection.

## 2022-03-30 ENCOUNTER — Ambulatory Visit (INDEPENDENT_AMBULATORY_CARE_PROVIDER_SITE_OTHER): Payer: BC Managed Care – PPO | Admitting: Medical-Surgical

## 2022-03-30 VITALS — BP 117/78 | HR 73 | Ht 65.0 in

## 2022-03-30 DIAGNOSIS — Z79899 Other long term (current) drug therapy: Secondary | ICD-10-CM

## 2022-03-30 DIAGNOSIS — F640 Transsexualism: Secondary | ICD-10-CM

## 2022-03-30 MED ORDER — TESTOSTERONE CYPIONATE 100 MG/ML IM SOLN: 100.0000 mg | Freq: Once | INTRAMUSCULAR | Status: DC

## 2022-03-30 NOTE — Progress Notes (Signed)
   Established Patient Office Visit  Subjective   Patient ID: Victoria Hill, adult    DOB: 1985/04/16  Age: 37 y.o. MRN: [id number]  Chief Complaint  Patient presents with   transgender person on hormone therapy    Testosterone injection - nurse visit    HPI  Testosterone injection- nurse visit . Patient denies chest pain, shortness of breath, palpitations or problems with medication.   ROS    Objective:     BP 117/78   Pulse 73   Ht 5' 5"$  (1.651 m)   SpO2 99%   BMI 36.28 kg/m    Physical Exam   No results found for any visits on 03/30/22.    The ASCVD Risk score (Arnett DK, et al., 2019) failed to calculate for the following reasons:   The 2019 ASCVD risk score is only valid for ages 85 to 72    Assessment & Plan:  Testosterone injection - nurse visit - admin testosterone 146m IM RUOQ . Patient tolerated injection well without complications.  Problem List Items Addressed This Visit   None   No follow-ups on file.    KRae Lips LPN

## 2022-03-30 NOTE — Progress Notes (Signed)
Agree with documentation as below.  ___________________________________________ Evaline Waltman L. Brandyn Lowrey, DNP, APRN, FNP-BC Primary Care and Sports Medicine [facility]  

## 2022-03-30 NOTE — Progress Notes (Signed)
Erroneous note. Please disregard.

## 2022-04-06 ENCOUNTER — Ambulatory Visit (INDEPENDENT_AMBULATORY_CARE_PROVIDER_SITE_OTHER): Payer: BC Managed Care – PPO | Admitting: Medical-Surgical

## 2022-04-06 VITALS — BP 123/78 | HR 80

## 2022-04-06 DIAGNOSIS — F640 Transsexualism: Secondary | ICD-10-CM

## 2022-04-06 DIAGNOSIS — Z79899 Other long term (current) drug therapy: Secondary | ICD-10-CM

## 2022-04-06 MED ORDER — TESTOSTERONE CYPIONATE 200 MG/ML IM SOLN
100.0000 mg | Freq: Once | INTRAMUSCULAR | Status: AC
  Administered 2022-04-06: 100 mg via INTRAMUSCULAR

## 2022-04-06 NOTE — Progress Notes (Signed)
Agree with documentation as below.  ___________________________________________ Janiesha Diehl L. Avriel Kandel, DNP, APRN, FNP-BC Primary Care and Sports Medicine [facility]  

## 2022-04-06 NOTE — Progress Notes (Signed)
Patient is here for testosterone injection. Denies chest pain, shortness of breath, headaches, and problems with medication or mood changes.   Patient tolerated testosterone 100 mg injection to LUOQ well without complications. Patient advised to schedule next injection in 7 days.

## 2022-04-13 ENCOUNTER — Ambulatory Visit (INDEPENDENT_AMBULATORY_CARE_PROVIDER_SITE_OTHER): Payer: BC Managed Care – PPO | Admitting: Medical-Surgical

## 2022-04-13 VITALS — BP 116/74 | HR 73

## 2022-04-13 DIAGNOSIS — F640 Transsexualism: Secondary | ICD-10-CM

## 2022-04-13 DIAGNOSIS — Z79899 Other long term (current) drug therapy: Secondary | ICD-10-CM | POA: Diagnosis not present

## 2022-04-13 MED ORDER — TESTOSTERONE CYPIONATE 200 MG/ML IM SOLN
100.0000 mg | Freq: Once | INTRAMUSCULAR | Status: AC
  Administered 2022-04-13: 100 mg via INTRAMUSCULAR

## 2022-04-13 NOTE — Progress Notes (Signed)
Agree with documentation as below.  ___________________________________________ Tarea Skillman L. Petra Sargeant, DNP, APRN, FNP-BC Primary Care and Sports Medicine [facility]  

## 2022-04-13 NOTE — Progress Notes (Signed)
Patient is here for testosterone injection. Denies chest pain, shortness of breath, headaches, and problems with medication or mood changes.   Patient tolerated testosterone 100 mg injection to RUOQ well without complications. Patient advised to schedule next injection in 7 days.   

## 2022-04-16 IMAGING — DX DG ANKLE COMPLETE 3+V*R*
3 series · 3 of 3 positions shown · non-contrast
Comparison: None.

CLINICAL DATA: Lateral right ankle pain

EXAM:
RIGHT ANKLE - COMPLETE 3+ VIEW

[ankle ap]
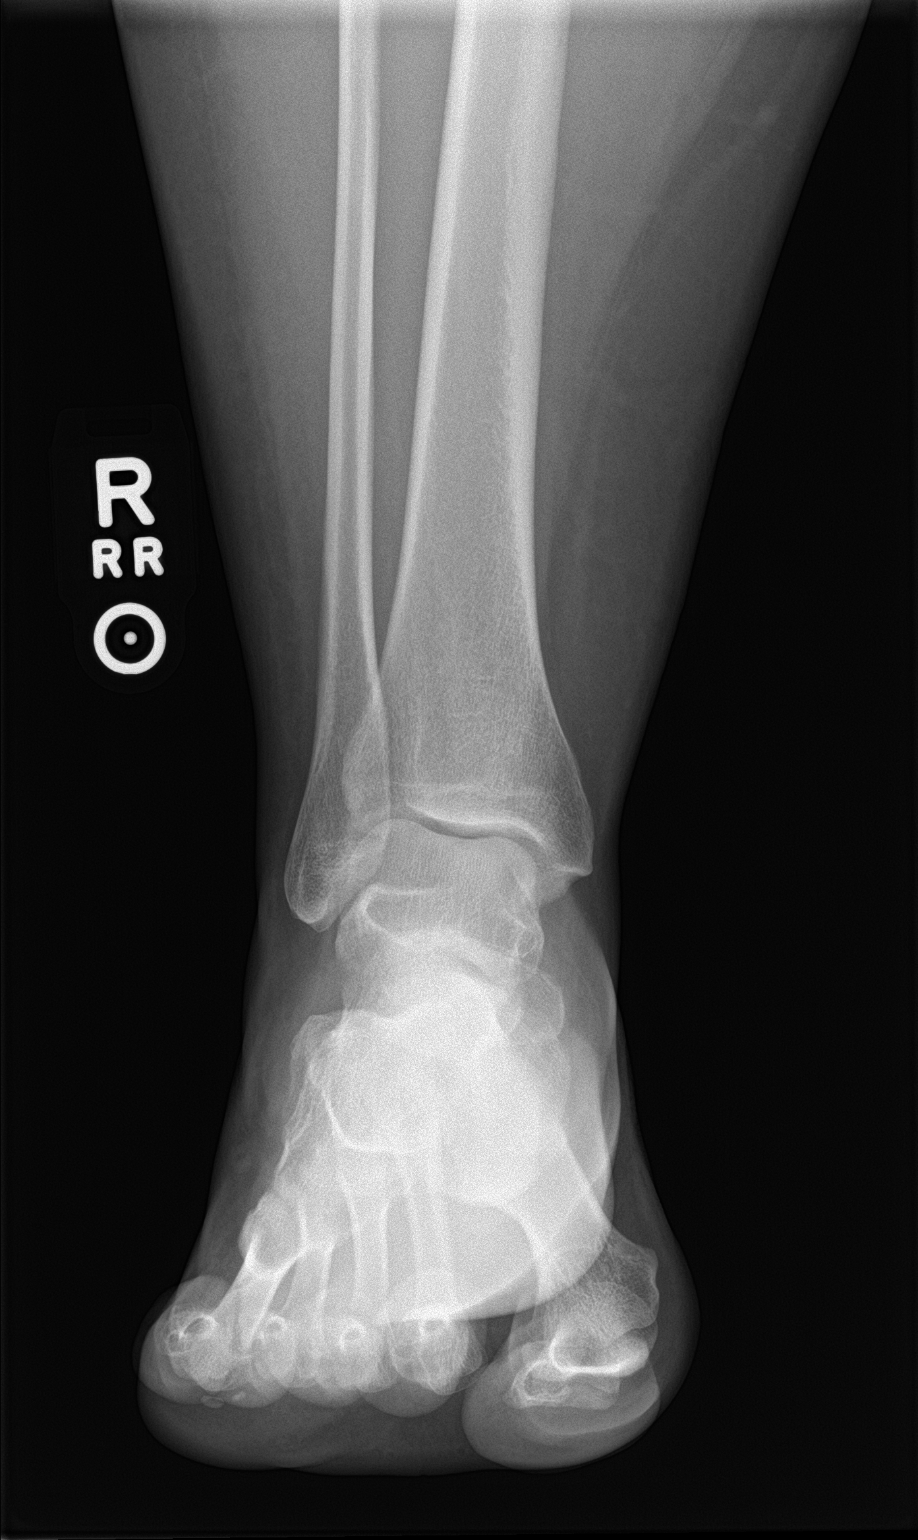

[ankle obl]
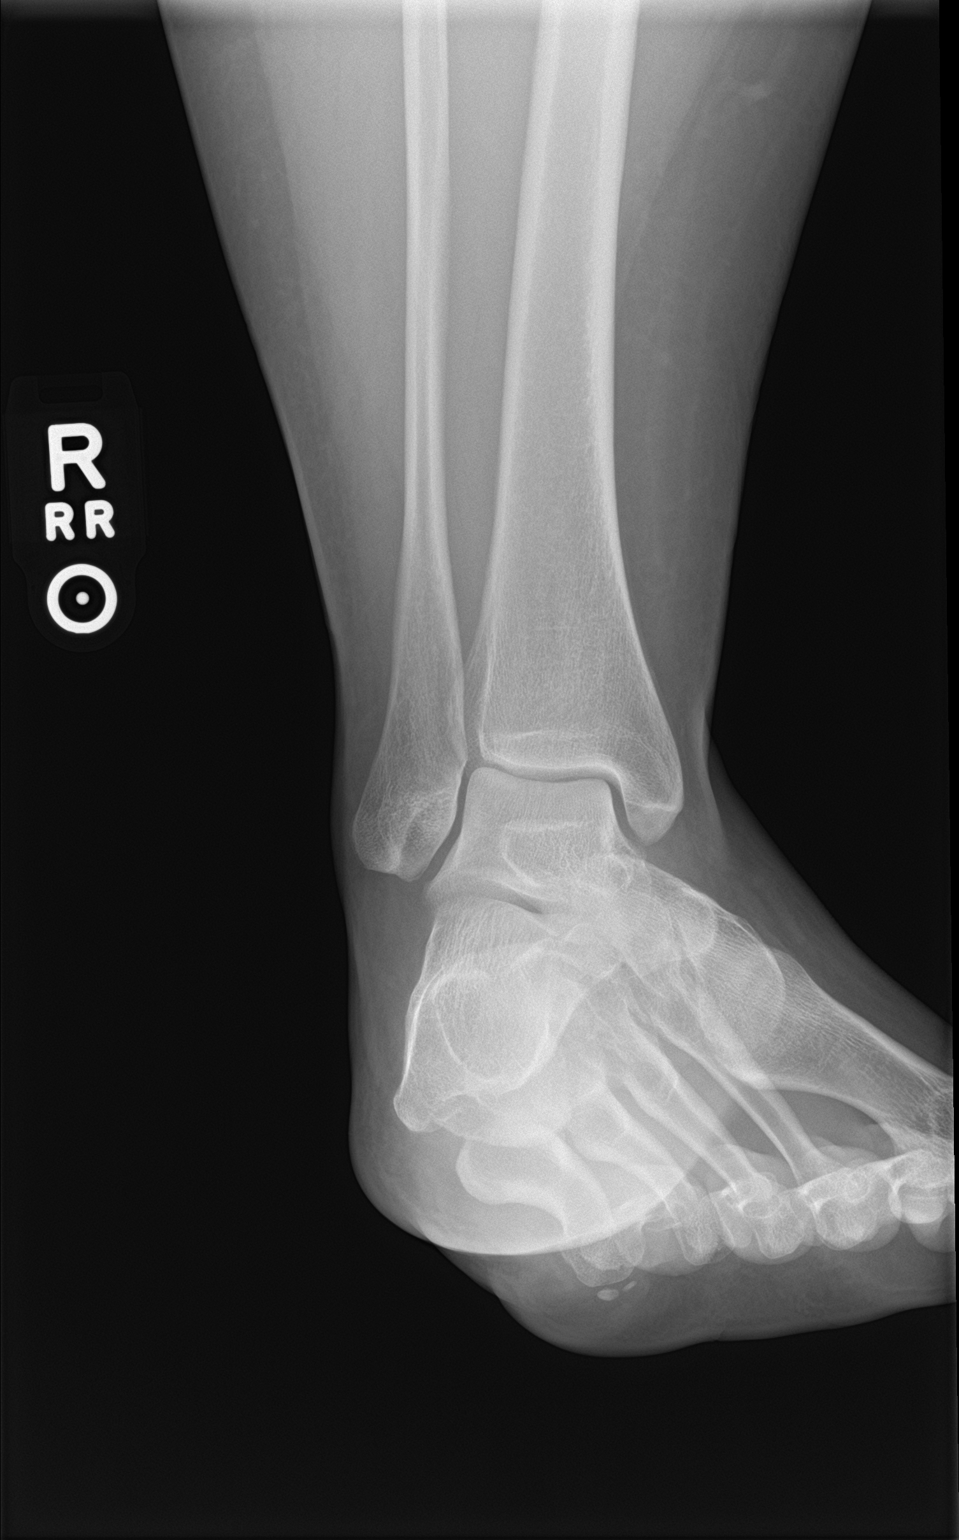

[ankle lat]
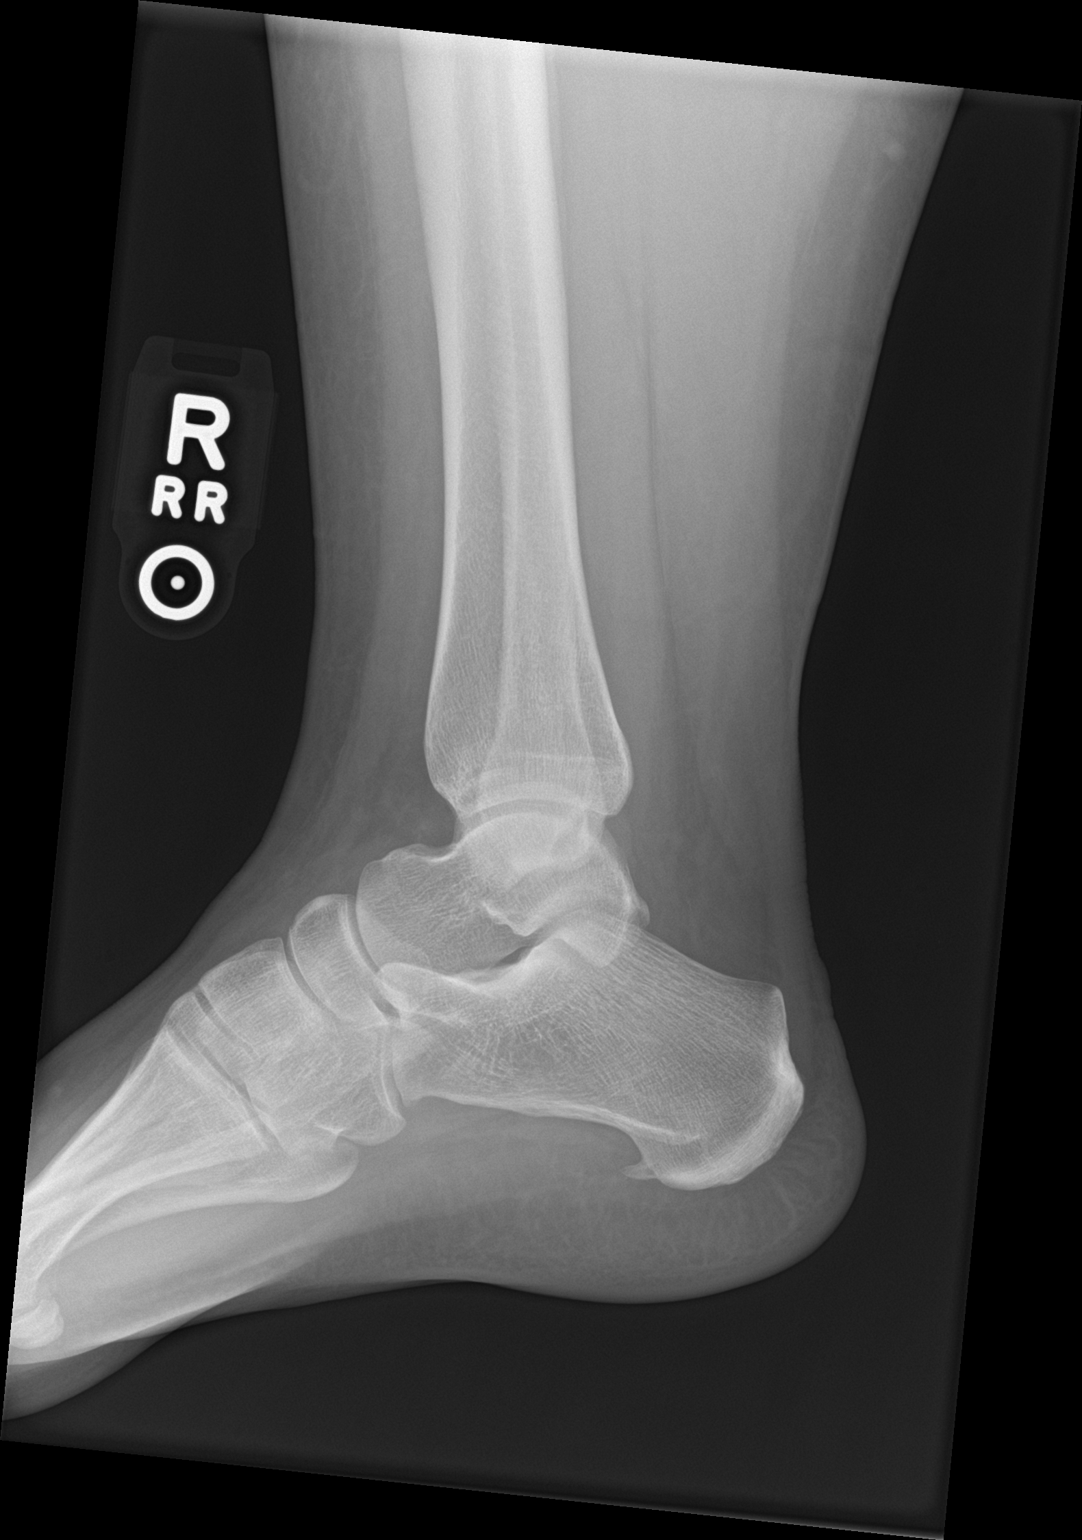

[3 of 3 positions shown; findings below may reference images not displayed]

FINDINGS: There is no evidence of fracture, dislocation, or joint effusion.
Small plantar calcaneal spur. There is no evidence of arthropathy or
other focal bone abnormality. Soft tissues are unremarkable.
IMPRESSION: 1. No acute osseous abnormality, right ankle.
2. Small plantar calcaneal spur.

## 2022-04-20 ENCOUNTER — Ambulatory Visit (INDEPENDENT_AMBULATORY_CARE_PROVIDER_SITE_OTHER): Payer: BC Managed Care – PPO | Admitting: Family Medicine

## 2022-04-20 VITALS — BP 129/75 | HR 83

## 2022-04-20 DIAGNOSIS — F640 Transsexualism: Secondary | ICD-10-CM | POA: Diagnosis not present

## 2022-04-20 DIAGNOSIS — Z79899 Other long term (current) drug therapy: Secondary | ICD-10-CM

## 2022-04-20 MED ORDER — TESTOSTERONE CYPIONATE 200 MG/ML IM SOLN
100.0000 mg | Freq: Once | INTRAMUSCULAR | Status: AC
  Administered 2022-04-20: 100 mg via INTRAMUSCULAR

## 2022-04-20 NOTE — Progress Notes (Signed)
Medical screening examination/treatment was performed by qualified clinical staff member and as supervising physician I was immediately available for consultation/collaboration. I have reviewed documentation and agree with assessment and plan.  Silena Wyss, DO  

## 2022-04-20 NOTE — Progress Notes (Signed)
Patient is here for testosterone injection. Denies chest pain, shortness of breath, headaches, and problems with medication or mood changes.   Patient tolerated testosterone 100 mg injection to LUOQ well without complications. Patient advised to schedule next injection in 7 days.   

## 2022-04-26 ENCOUNTER — Other Ambulatory Visit: Payer: Self-pay

## 2022-04-26 ENCOUNTER — Ambulatory Visit (INDEPENDENT_AMBULATORY_CARE_PROVIDER_SITE_OTHER): Payer: BC Managed Care – PPO | Admitting: Medical-Surgical

## 2022-04-26 VITALS — BP 115/76 | HR 77 | Ht 65.0 in

## 2022-04-26 DIAGNOSIS — F640 Transsexualism: Secondary | ICD-10-CM | POA: Diagnosis not present

## 2022-04-26 MED ORDER — ESCITALOPRAM OXALATE 5 MG PO TABS: 5.0000 mg | ORAL_TABLET | Freq: Every day | ORAL | 1 refills | Status: DC

## 2022-04-26 MED ORDER — TESTOSTERONE CYPIONATE 200 MG/ML IM SOLN
100.0000 mg | Freq: Once | INTRAMUSCULAR | Status: AC
  Administered 2022-04-26: 100 mg via INTRAMUSCULAR

## 2022-04-26 NOTE — Progress Notes (Signed)
Agree with documentation as below.  ___________________________________________ Skya Mccullum L. Blossie Raffel, DNP, APRN, FNP-BC Primary Care and Sports Medicine [facility]  

## 2022-04-26 NOTE — Progress Notes (Signed)
   Established Patient Office Visit  Subjective   Patient ID: Victoria Hill, adult    DOB: 12-Oct-1985  Age: 37 y.o. MRN: [id number]  No chief complaint on file.   HPI  Transgender person on hormone therapy. Testosterone injection. Nurse visit. Paitent denies chest pain, shortness of breath, palpitations, dizziness, mood changes or problems with medication.   ROS    Objective:     BP 115/76    Physical Exam   No results found for any visits on 04/26/22.    The ASCVD Risk score (Arnett DK, et al., 2019) failed to calculate for the following reasons:   The 2019 ASCVD risk score is only valid for ages 30 to 8    Assessment & Plan:  Testosterone injection - nurse  visit.Samuel Bouche, NP approved early administration of testosterone.  Administered  testosterone 100mg   IM RUOQ . Patient tolerated injection well without complications. Patient will return in 7 days for next testosterone injection.  Problem List Items Addressed This Visit   None   No follow-ups on file.    Rae Lips, LPN

## 2022-05-03 ENCOUNTER — Ambulatory Visit (INDEPENDENT_AMBULATORY_CARE_PROVIDER_SITE_OTHER): Payer: BC Managed Care – PPO | Admitting: Medical-Surgical

## 2022-05-03 VITALS — BP 124/73 | HR 76 | Ht 65.0 in

## 2022-05-03 DIAGNOSIS — F640 Transsexualism: Secondary | ICD-10-CM | POA: Diagnosis not present

## 2022-05-03 DIAGNOSIS — Z79899 Other long term (current) drug therapy: Secondary | ICD-10-CM

## 2022-05-03 MED ORDER — TESTOSTERONE CYPIONATE 200 MG/ML IM SOLN
100.0000 mg | Freq: Once | INTRAMUSCULAR | Status: AC
  Administered 2022-05-03: 100 mg via INTRAMUSCULAR

## 2022-05-03 NOTE — Progress Notes (Signed)
Agree with documentation as below.  ___________________________________________ Ardena Gangl L. Vaden Becherer, DNP, APRN, FNP-BC Primary Care and Sports Medicine [facility]  

## 2022-05-03 NOTE — Patient Instructions (Signed)
Return in 7 days for nurse visit for testosterone injection.  

## 2022-05-03 NOTE — Progress Notes (Signed)
   Established Patient Office Visit  Subjective   Patient ID: Victoria Hill, adult    DOB: 01-01-1986  Age: 37 y.o. MRN: [id number]  Chief Complaint  Patient presents with   transgender person on hormone therapy    Testosterone injection- nurse visit.     HPI  Transgender person on hormone therapy- testosterone injection- nurse visit. Patient denies chest pain, shortness of breath, dizziness, palpitations, or problems with medication.  ROS    Objective:     BP 124/73   Pulse 76   Ht 5\' 5"  (1.651 m)   SpO2 100%   BMI 36.28 kg/m    Physical Exam   No results found for any visits on 05/03/22.    The ASCVD Risk score (Arnett DK, et al., 2019) failed to calculate for the following reasons:   The 2019 ASCVD risk score is only valid for ages 51 to 36    Assessment & Plan:  Testosterone injection nurse visit. - admin 100mg  IM LUOQ . Patient tolerated injection well without complications.  Problem List Items Addressed This Visit       Other   Transgender person on hormone therapy - Primary    Return in about 1 week (around 05/10/2022) for nurse visit for testosterone injection.Rae Lips, LPN

## 2022-05-10 ENCOUNTER — Ambulatory Visit: Payer: BC Managed Care – PPO

## 2022-05-15 ENCOUNTER — Encounter: Payer: Self-pay | Admitting: *Deleted

## 2022-05-17 ENCOUNTER — Ambulatory Visit (INDEPENDENT_AMBULATORY_CARE_PROVIDER_SITE_OTHER): Payer: BC Managed Care – PPO | Admitting: Medical-Surgical

## 2022-05-17 VITALS — BP 106/72 | HR 74 | Ht 65.0 in

## 2022-05-17 DIAGNOSIS — F640 Transsexualism: Secondary | ICD-10-CM | POA: Diagnosis not present

## 2022-05-17 DIAGNOSIS — Z79899 Other long term (current) drug therapy: Secondary | ICD-10-CM

## 2022-05-17 MED ORDER — TESTOSTERONE CYPIONATE 200 MG/ML IM SOLN
100.0000 mg | Freq: Once | INTRAMUSCULAR | Status: AC
  Administered 2022-05-17: 100 mg via INTRAMUSCULAR

## 2022-05-17 NOTE — Progress Notes (Signed)
Agree with documentation as below.  ___________________________________________ Tahara Ruffini L. Iasha Mccalister, DNP, APRN, FNP-BC Primary Care and Sports Medicine [facility]  

## 2022-05-17 NOTE — Patient Instructions (Signed)
Return in 7 days for testosterone injection nurse visit.

## 2022-05-17 NOTE — Progress Notes (Signed)
   Established Patient Office Visit  Subjective   Patient ID: Victoria Hill, adult    DOB: [birth year]  Age: 37 y.o. MRN: [id number]  Chief Complaint  Patient presents with   trnasgender person on hormone therapy    Testosterone injection- nurse visit.     HPI  Transgender person on hormone therapy- testosterone injection nurse visit. Patient denies chest pain, shortness of breath, dizziness, palpitations, mood changes or problems with medication. Patient missed last week testosterone injection due to having bronchitis and not feeling well enough for injection.   ROS    Objective:     BP 106/72   Pulse 74   Ht  (1.651 m)   SpO2 100%   BMI 36.28 kg/m    Physical Exam   No results found for any visits on 05/17/22.    The ASCVD Risk score (Arnett DK, et al., 2019) failed to calculate for the following reasons:   The 2019 ASCVD risk score is only valid for ages 60 to 30    Assessment & Plan:  Testosterone injection nurse visit-  admin  IM RUOQ. Patient tolerated injection well without complication. Patient will return in 7 days for nurse visit for testosterone injection.  Problem List Items Addressed This Visit       Other   Transgender person on hormone therapy - Primary    Return in about 1 week (around 05/24/2022) for nurse visit for testosterone injection.Elizabeth Palau, LPN

## 2022-05-24 ENCOUNTER — Ambulatory Visit (INDEPENDENT_AMBULATORY_CARE_PROVIDER_SITE_OTHER): Payer: BC Managed Care – PPO | Admitting: Medical-Surgical

## 2022-05-24 VITALS — BP 120/79 | HR 75

## 2022-05-24 DIAGNOSIS — F32A Depression, unspecified: Secondary | ICD-10-CM

## 2022-05-24 DIAGNOSIS — Z79899 Other long term (current) drug therapy: Secondary | ICD-10-CM

## 2022-05-24 DIAGNOSIS — F419 Anxiety disorder, unspecified: Secondary | ICD-10-CM

## 2022-05-24 DIAGNOSIS — F640 Transsexualism: Secondary | ICD-10-CM | POA: Diagnosis not present

## 2022-05-24 MED ORDER — TESTOSTERONE CYPIONATE 200 MG/ML IM SOLN
100.0000 mg | Freq: Once | INTRAMUSCULAR | Status: AC
  Administered 2022-05-24: 100 mg via INTRAMUSCULAR

## 2022-05-24 NOTE — Progress Notes (Signed)
   Established Patient Office Visit  Subjective   Patient ID: Victoria Hill, adult    DOB: 07-Apr-1985  Age: 37 y.o. MRN: [id number]  Chief Complaint  Patient presents with   Injections    HPI  Terisha Losasso is here for a testosterone injection. Denies chest pain, shortness of breath, headaches or mood changes.   ROS    Objective:     BP 120/79   Pulse 75   SpO2 100%    Physical Exam   No results found for any visits on 05/24/22.    The ASCVD Risk score (Arnett DK, et al., 2019) failed to calculate for the following reasons:   The 2019 ASCVD risk score is only valid for ages 31 to 2    Assessment & Plan:  Testosterone injection - Patient tolerated injection well without complications. Patient advised to schedule next injection 7 days from today.    Problem List Items Addressed This Visit       Unprioritized   Transgender person on hormone therapy - Primary   Other Visit Diagnoses     Anxiety and depression           Return in about 1 week (around 05/31/2022) for testosterone injection. Earna Coder, [name], CMA

## 2022-05-24 NOTE — Progress Notes (Signed)
Agree with documentation as below.  ___________________________________________ Charmon Thorson L. Keath Matera, DNP, APRN, FNP-BC Primary Care and Sports Medicine [facility]  

## 2022-06-01 ENCOUNTER — Ambulatory Visit (INDEPENDENT_AMBULATORY_CARE_PROVIDER_SITE_OTHER): Payer: BC Managed Care – PPO | Admitting: Medical-Surgical

## 2022-06-01 VITALS — BP 113/65 | HR 71

## 2022-06-01 DIAGNOSIS — Z79899 Other long term (current) drug therapy: Secondary | ICD-10-CM

## 2022-06-01 DIAGNOSIS — F419 Anxiety disorder, unspecified: Secondary | ICD-10-CM

## 2022-06-01 DIAGNOSIS — F640 Transsexualism: Secondary | ICD-10-CM

## 2022-06-01 DIAGNOSIS — F32A Depression, unspecified: Secondary | ICD-10-CM

## 2022-06-01 MED ORDER — TESTOSTERONE CYPIONATE 200 MG/ML IM SOLN
100.0000 mg | Freq: Once | INTRAMUSCULAR | Status: AC
  Administered 2022-06-01: 100 mg via INTRAMUSCULAR

## 2022-06-01 MED ORDER — BUPROPION HCL ER (XL) 150 MG PO TB24: 150.0000 mg | ORAL_TABLET | ORAL | 3 refills | Status: DC

## 2022-06-01 NOTE — Progress Notes (Signed)
Agree with documentation as below.  ___________________________________________ Chez Bulnes L. Syrina Wake, DNP, APRN, FNP-BC Primary Care and Sports Medicine [facility]  

## 2022-06-01 NOTE — Progress Notes (Signed)
   Established Patient Office Visit  Subjective   Patient ID: Victoria Hill, adult    DOB: 10/10/1985  Age: 37 y.o. MRN: [id number]  Chief Complaint  Patient presents with   Injections    HPI  Victoria Hill is here for a testosterone injection. Denies chest pain, shortness of breath, headaches or mood changes.   ROS    Objective:     BP 113/65   Pulse 71   SpO2 100%    Physical Exam   No results found for any visits on 06/01/22.    The ASCVD Risk score (Arnett DK, et al., 2019) failed to calculate for the following reasons:   The 2019 ASCVD risk score is only valid for ages 26 to 40    Assessment & Plan:  Testosterone injection - Patient tolerated injection well without complications. Patient advised to schedule next injection 7 days from today.    Problem List Items Addressed This Visit       Unprioritized   Transgender person on hormone therapy - Primary    Return in about 1 week (around 06/08/2022) for testosterone injection. Earna Coder, [name], CMA

## 2022-06-08 ENCOUNTER — Other Ambulatory Visit: Payer: Self-pay

## 2022-06-08 ENCOUNTER — Ambulatory Visit (INDEPENDENT_AMBULATORY_CARE_PROVIDER_SITE_OTHER): Payer: BC Managed Care – PPO | Admitting: Medical-Surgical

## 2022-06-08 VITALS — BP 118/69 | HR 75 | Ht 65.0 in

## 2022-06-08 DIAGNOSIS — F640 Transsexualism: Secondary | ICD-10-CM | POA: Diagnosis not present

## 2022-06-08 DIAGNOSIS — Z79899 Other long term (current) drug therapy: Secondary | ICD-10-CM

## 2022-06-08 MED ORDER — TESTOSTERONE CYPIONATE 200 MG/ML IM SOLN
100.0000 mg | Freq: Once | INTRAMUSCULAR | Status: AC
  Administered 2022-06-08: 100 mg via INTRAMUSCULAR

## 2022-06-08 NOTE — Patient Instructions (Signed)
Return in 7 days for testosterone injection nurse visit. 

## 2022-06-08 NOTE — Progress Notes (Signed)
   Established Patient Office Visit  Subjective   Patient ID: Victoria Hill, adult    DOB: [birth year]  Age: 37 y.o. MRN: [id number]  Chief Complaint  Patient presents with   transgender person on hormone therapy    Testosterone injection nurse visit.     HPI  Transgender person on  hormone therapy .testosterone injection nurse visit. Patient denies chest pain, shortness of breath, dizziness, palpitations , mood changes or problems with medication.  ROS    Objective:     BP 118/69   Pulse 75   Ht 5\' 5"  (1.651 m)   SpO2 100%   BMI 36.28 kg/m    Physical Exam   No results found for any visits on 06/08/22.    The ASCVD Risk score (Arnett DK, et al., 2019) failed to calculate for the following reasons:   The 2019 ASCVD risk score is only valid for ages 23 to 20    Assessment & Plan:  Testosterone injection nurse visit. Admin 100mg  IM LUOQ . Patient tolerated injection well without complications. Patient will return in 7 days for next testosterone injection. Problem List Items Addressed This Visit   None   No follow-ups on file.    Elizabeth Palau, LPN

## 2022-06-08 NOTE — Progress Notes (Signed)
Agree with documentation as below.  ___________________________________________ Darice Vicario L. Raevon Broom, DNP, APRN, FNP-BC Primary Care and Sports Medicine [facility]  

## 2022-06-14 ENCOUNTER — Ambulatory Visit (INDEPENDENT_AMBULATORY_CARE_PROVIDER_SITE_OTHER): Payer: BC Managed Care – PPO | Admitting: Medical-Surgical

## 2022-06-14 VITALS — BP 120/75 | HR 78 | Ht 65.0 in

## 2022-06-14 DIAGNOSIS — Z79899 Other long term (current) drug therapy: Secondary | ICD-10-CM

## 2022-06-14 DIAGNOSIS — F640 Transsexualism: Secondary | ICD-10-CM

## 2022-06-14 MED ORDER — TESTOSTERONE CYPIONATE 200 MG/ML IM SOLN
100.0000 mg | Freq: Once | INTRAMUSCULAR | Status: AC
  Administered 2022-06-14: 100 mg via INTRAMUSCULAR

## 2022-06-14 NOTE — Progress Notes (Signed)
Agree with documentation as below.  ___________________________________________ Prescious Hurless L. Fernand Sorbello, DNP, APRN, FNP-BC Primary Care and Sports Medicine [facility]  

## 2022-06-14 NOTE — Progress Notes (Signed)
   Established Patient Office Visit  Subjective   Patient ID: Victoria Hill, adult    DOB: April 02, 1985  Age: 37 y.o. MRN: [id number]  Chief Complaint  Patient presents with   testosterone injection    Testosterone injection nurse visit.     HPI  Testosterone injection nurse visit. Patient denies chest pain, shortness of breath, dizziness, palpitations, mood changes or medication problems.   ROS    Objective:     BP 120/75   Pulse 78   Ht 5\' 5"  (1.651 m)   SpO2 100%   BMI 36.28 kg/m    Physical Exam   No results found for any visits on 06/14/22.    The ASCVD Risk score (Arnett DK, et al., 2019) failed to calculate for the following reasons:   The 2019 ASCVD risk score is only valid for ages 20 to 74    Assessment & Plan:  Testosterone injection nurse visit.  Admin 100mg  IM RUOQ IM . Patient tolerated injection well without complications. Patient will return in 7 days for next testosterone injection as nurse visit.  Problem List Items Addressed This Visit   None   No follow-ups on file.    Elizabeth Palau, LPN

## 2022-06-14 NOTE — Patient Instructions (Signed)
return in 7 days for next testosterone injection as nurse visit

## 2022-06-21 ENCOUNTER — Ambulatory Visit (INDEPENDENT_AMBULATORY_CARE_PROVIDER_SITE_OTHER): Payer: BC Managed Care – PPO | Admitting: Medical-Surgical

## 2022-06-21 VITALS — BP 127/77 | HR 72

## 2022-06-21 DIAGNOSIS — F640 Transsexualism: Secondary | ICD-10-CM

## 2022-06-21 MED ORDER — TESTOSTERONE CYPIONATE 200 MG/ML IM SOLN
100.0000 mg | Freq: Once | INTRAMUSCULAR | Status: AC
Start: 2022-06-21 — End: 2022-06-21
  Administered 2022-06-21: 100 mg via INTRAMUSCULAR

## 2022-06-21 NOTE — Progress Notes (Signed)
Agree with documentation as below.  ___________________________________________ Jabril Pursell L. Pahola Dimmitt, DNP, APRN, FNP-BC Primary Care and Sports Medicine [facility]  

## 2022-06-21 NOTE — Progress Notes (Signed)
   Established Patient Office Visit  Subjective   Patient ID: Victoria Hill, adult    DOB: [birth year]  Age: 37 y.o. MRN: [id number]  Chief Complaint  Patient presents with   Injections    HPI  Victoria Hill is here for a testosterone injection. Denies chest pain, shortness of breath, headaches or mood changes.   ROS    Objective:     BP 127/77   Pulse 72   SpO2 99%    Physical Exam   No results found for any visits on 06/21/22.    The ASCVD Risk score (Arnett DK, et al., 2019) failed to calculate for the following reasons:   The 2019 ASCVD risk score is only valid for ages 66 to 52    Assessment & Plan:  Testosterone injection - Patient tolerated injection well without complications. Patient advised to schedule next injection 7 days from today.    Problem List Items Addressed This Visit       Unprioritized   Transgender person on hormone therapy - Primary    Return in about 1 week (around 06/28/2022) for testosterone injection. Earna Coder, [name], CMA

## 2022-06-28 ENCOUNTER — Ambulatory Visit (INDEPENDENT_AMBULATORY_CARE_PROVIDER_SITE_OTHER): Payer: BC Managed Care – PPO | Admitting: Medical-Surgical

## 2022-06-28 VITALS — BP 128/77 | HR 77 | Ht 65.0 in

## 2022-06-28 DIAGNOSIS — Z79899 Other long term (current) drug therapy: Secondary | ICD-10-CM | POA: Diagnosis not present

## 2022-06-28 DIAGNOSIS — F640 Transsexualism: Secondary | ICD-10-CM

## 2022-06-28 MED ORDER — TESTOSTERONE CYPIONATE 200 MG/ML IM SOLN
100.0000 mg | Freq: Once | INTRAMUSCULAR | Status: AC
Start: 2022-06-28 — End: 2022-06-28
  Administered 2022-06-28: 100 mg via INTRAMUSCULAR

## 2022-06-28 NOTE — Patient Instructions (Signed)
Return in 7 days for testosterone injection nurse visit. 

## 2022-06-28 NOTE — Progress Notes (Signed)
Agree with documentation as below.  ___________________________________________ Evangelos Paulino L. Arbutus Nelligan, DNP, APRN, FNP-BC Primary Care and Sports Medicine [facility]  

## 2022-06-28 NOTE — Progress Notes (Signed)
   Established Patient Office Visit  Subjective   Patient ID: Victoria Hill, adult    DOB: 09/11/1985  Age: 37 y.o. MRN: [id number]  Chief Complaint  Patient presents with   transgender person on hormone therapy    Testosterone injection nurse visit.     HPI  Transgender person on hormone therapy- Testosterone injection nurse visit. Patient denies chest pain , shortness of breath, palpitations,  mood changes or medication problems.   ROS    Objective:     BP 128/77   Pulse 77   Ht 5\' 5"  (1.651 m)   SpO2 100%   BMI 36.28 kg/m    Physical Exam   No results found for any visits on 06/28/22.    The ASCVD Risk score (Arnett DK, et al., 2019) failed to calculate for the following reasons:   The 2019 ASCVD risk score is only valid for ages 83 to 47    Assessment & Plan:  Testosterone injection nurse visit. Admin Testoserone 100mg   IM RUOQ. Patient tolerated injection well without complications. Return in 7 days for testosterone injection nurse visit.  Problem List Items Addressed This Visit       Other   Transgender person on hormone therapy - Primary    Return in about 1 week (around 07/05/2022) for testosterone  injection - nurse visit. Elizabeth Palau, LPN

## 2022-07-05 ENCOUNTER — Ambulatory Visit (INDEPENDENT_AMBULATORY_CARE_PROVIDER_SITE_OTHER): Payer: BC Managed Care – PPO | Admitting: Medical-Surgical

## 2022-07-05 VITALS — BP 120/80 | HR 78 | Temp 98.5°F | Ht 64.0 in | Wt 211.1 lb

## 2022-07-05 DIAGNOSIS — F640 Transsexualism: Secondary | ICD-10-CM | POA: Diagnosis not present

## 2022-07-05 DIAGNOSIS — Z79899 Other long term (current) drug therapy: Secondary | ICD-10-CM

## 2022-07-05 MED ORDER — TESTOSTERONE CYPIONATE 200 MG/ML IM SOLN
100.0000 mg | Freq: Once | INTRAMUSCULAR | Status: AC
Start: 2022-07-05 — End: 2022-07-05
  Administered 2022-07-05: 100 mg via INTRAMUSCULAR

## 2022-07-05 NOTE — Progress Notes (Signed)
Patient is here for a testosterone injection. Denies chest pain, shortness of breath, headaches and problems with medication or mood changes.   Patient tolerated injection on LUOQ well without complications. Patient advised to schedule next injection in 7 days.   

## 2022-07-05 NOTE — Progress Notes (Signed)
Agree with documentation as below.  ___________________________________________ Patterson Hollenbaugh L. Sheza Strickland, DNP, APRN, FNP-BC Primary Care and Sports Medicine  [facility]  

## 2022-07-12 ENCOUNTER — Ambulatory Visit (INDEPENDENT_AMBULATORY_CARE_PROVIDER_SITE_OTHER): Payer: BC Managed Care – PPO | Admitting: Medical-Surgical

## 2022-07-12 VITALS — BP 122/75 | HR 78

## 2022-07-12 DIAGNOSIS — Z79899 Other long term (current) drug therapy: Secondary | ICD-10-CM

## 2022-07-12 DIAGNOSIS — F640 Transsexualism: Secondary | ICD-10-CM | POA: Diagnosis not present

## 2022-07-12 MED ORDER — TESTOSTERONE CYPIONATE 200 MG/ML IM SOLN
100.0000 mg | Freq: Once | INTRAMUSCULAR | Status: AC
Start: 2022-07-12 — End: 2022-07-12
  Administered 2022-07-12: 100 mg via INTRAMUSCULAR

## 2022-07-12 NOTE — Progress Notes (Signed)
   Established Patient Office Visit  Subjective   Patient ID: Victoria Hill, adult    DOB: 01/04/1986  Age: 37 y.o. MRN: [id number]  Chief Complaint  Patient presents with   Injections    HPI  Victoria Hill is here for a testosterone injection. Denies chest pain, shortness of breath, headaches or mood changes.   ROS    Objective:     BP 122/75   Pulse 78   SpO2 99%    Physical Exam   No results found for any visits on 07/12/22.    The ASCVD Risk score (Arnett DK, et al., 2019) failed to calculate for the following reasons:   The 2019 ASCVD risk score is only valid for ages 60 to 63    Assessment & Plan:  Testosterone injection - Patient tolerated injection well without complications. Patient advised to schedule next injection 7 days from today.     Problem List Items Addressed This Visit       Unprioritized   Transgender person on hormone therapy - Primary    Return in about 1 week (around 07/19/2022) for testosterone injection. Earna Coder, [name], CMA

## 2022-07-12 NOTE — Progress Notes (Signed)
Agree with documentation as below.  ___________________________________________ Atiba Kimberlin L. Aydee Mcnew, DNP, APRN, FNP-BC Primary Care and Sports Medicine [facility]  

## 2022-07-19 ENCOUNTER — Ambulatory Visit (INDEPENDENT_AMBULATORY_CARE_PROVIDER_SITE_OTHER): Payer: BC Managed Care – PPO | Admitting: Medical-Surgical

## 2022-07-19 VITALS — BP 117/72 | HR 74 | Ht 64.0 in

## 2022-07-19 DIAGNOSIS — F640 Transsexualism: Secondary | ICD-10-CM

## 2022-07-19 DIAGNOSIS — Z79899 Other long term (current) drug therapy: Secondary | ICD-10-CM | POA: Diagnosis not present

## 2022-07-19 MED ORDER — TESTOSTERONE CYPIONATE 200 MG/ML IM SOLN
100.0000 mg | Freq: Once | INTRAMUSCULAR | Status: AC
Start: 2022-07-19 — End: 2022-07-19
  Administered 2022-07-19: 100 mg via INTRAMUSCULAR

## 2022-07-19 NOTE — Progress Notes (Signed)
   Established Patient Office Visit  Subjective   Patient ID: Victoria Hill, adult    DOB: Mar 16, 1985  Age: 37 y.o. MRN: [id number]  Chief Complaint  Patient presents with   transgender person on hormone therapy    Testosterone injection nurse visit.    HPI  Transgender person on hormone therapy.  Pateint denies chest pain, shortness of breath, dizziness, palpitations , mood changes or problems with medication.   ROS    Objective:     BP 117/72   Pulse 74   Ht 5\' 4"  (1.626 m)   SpO2 100%   BMI 36.23 kg/m    Physical Exam   No results found for any visits on 07/19/22.    The ASCVD Risk score (Arnett DK, et al., 2019) failed to calculate for the following reasons:   The 2019 ASCVD risk score is only valid for ages 59 to 71    Assessment & Plan:  Testosterone injection nurse visit.  Admin 100mg  IM LUOQ . Patient tolerated injection well without complications. Patient will return in 7 days for next testosterone injection.  Problem List Items Addressed This Visit       Other   Transgender person on hormone therapy - Primary    Return in about 1 week (around 07/26/2022) for testosterone injection nurse visit. Elizabeth Palau, LPN

## 2022-07-19 NOTE — Patient Instructions (Signed)
Return in 7 days for next testosterone injection nurse visit.

## 2022-07-19 NOTE — Progress Notes (Signed)
Agree with documentation as below.  ___________________________________________ Benjamen Koelling L. Kemara Quigley, DNP, APRN, FNP-BC Primary Care and Sports Medicine [facility]  

## 2022-07-26 ENCOUNTER — Ambulatory Visit (INDEPENDENT_AMBULATORY_CARE_PROVIDER_SITE_OTHER): Payer: BC Managed Care – PPO | Admitting: Medical-Surgical

## 2022-07-26 VITALS — BP 129/82 | HR 79 | Ht 64.0 in | Wt 211.0 lb

## 2022-07-26 DIAGNOSIS — Z79899 Other long term (current) drug therapy: Secondary | ICD-10-CM

## 2022-07-26 DIAGNOSIS — F640 Transsexualism: Secondary | ICD-10-CM

## 2022-07-26 MED ORDER — TESTOSTERONE CYPIONATE 200 MG/ML IM SOLN
100.0000 mg | Freq: Once | INTRAMUSCULAR | Status: AC
Start: 2022-07-26 — End: 2022-07-26
  Administered 2022-07-26: 100 mg via INTRAMUSCULAR

## 2022-07-26 NOTE — Progress Notes (Signed)
Agree with documentation as below.  ___________________________________________ Magaret Justo L. Shaydon Lease, DNP, APRN, FNP-BC Primary Care and Sports Medicine [facility]  

## 2022-07-26 NOTE — Progress Notes (Signed)
Testosterone injection nurse visit.  Admin 100mg  IM RUOQ. Patient tolerated injection well without complications. Patient will return in 7 days for next testosterone injection.

## 2022-08-02 ENCOUNTER — Ambulatory Visit (INDEPENDENT_AMBULATORY_CARE_PROVIDER_SITE_OTHER): Payer: BC Managed Care – PPO | Admitting: Medical-Surgical

## 2022-08-02 VITALS — BP 124/75 | HR 76 | Ht 64.0 in

## 2022-08-02 DIAGNOSIS — F640 Transsexualism: Secondary | ICD-10-CM

## 2022-08-02 DIAGNOSIS — Z79899 Other long term (current) drug therapy: Secondary | ICD-10-CM | POA: Diagnosis not present

## 2022-08-02 MED ORDER — TESTOSTERONE CYPIONATE 200 MG/ML IM SOLN
100.0000 mg | Freq: Once | INTRAMUSCULAR | Status: AC
Start: 2022-08-02 — End: 2022-08-02
  Administered 2022-08-02: 100 mg via INTRAMUSCULAR

## 2022-08-02 NOTE — Progress Notes (Signed)
   Established Patient Office Visit  Subjective   Patient ID: Victoria Hill, adult    DOB: [birth year]  Age: 37 y.o. MRN: [id number]  Chief Complaint  Patient presents with   transgender person on hormone therapy    Testosterone injection nurse visit.     HPI  Transgender person on hormone thereapy. Testosterone injection  nurse visit. Patient denies chest pain, shortness of breath, dizziness, palpitations, mood changes or medication  problems.   ROS    Objective:     BP 124/75   Pulse 76   Ht 5\' 4"  (1.626 m)   SpO2 100%   BMI 36.22 kg/m    Physical Exam   No results found for any visits on 08/02/22.    The ASCVD Risk score (Arnett DK, et al., 2019) failed to calculate for the following reasons:   The 2019 ASCVD risk score is only valid for ages 19 to 80    Assessment & Plan:  Testosterone injection  admin 100mg   IM LUOQ. Patient tolerated injection well without complications. Return in 7 days for next testosterone injection nurse visit.  Problem List Items Addressed This Visit       Other   Transgender person on hormone therapy - Primary    Return in about 1 week (around 08/09/2022) for testosterone injection nurse visit. Elizabeth Palau, LPN

## 2022-08-02 NOTE — Progress Notes (Signed)
Medical screening examination/treatment was performed by qualified clinical staff member and as supervising provider I was immediately available for consultation/collaboration. I have reviewed documentation and agree with assessment and plan. ° °Jovanne Riggenbach L. Keniya Schlotterbeck, DNP, APRN, FNP-BC °[facility] °Primary Care and Sports Medicine ° °

## 2022-08-02 NOTE — Patient Instructions (Signed)
Return in 7 days for next testosterone injection.

## 2022-08-06 IMAGING — DX DG ANKLE COMPLETE 3+V*R*
3 series · 3 of 3 positions shown · non-contrast
Comparison: December 15, 2019.

CLINICAL DATA: Pain behind the lateral malleolus.

EXAM:
RIGHT ANKLE - COMPLETE 3+ VIEW

[ankle ap]
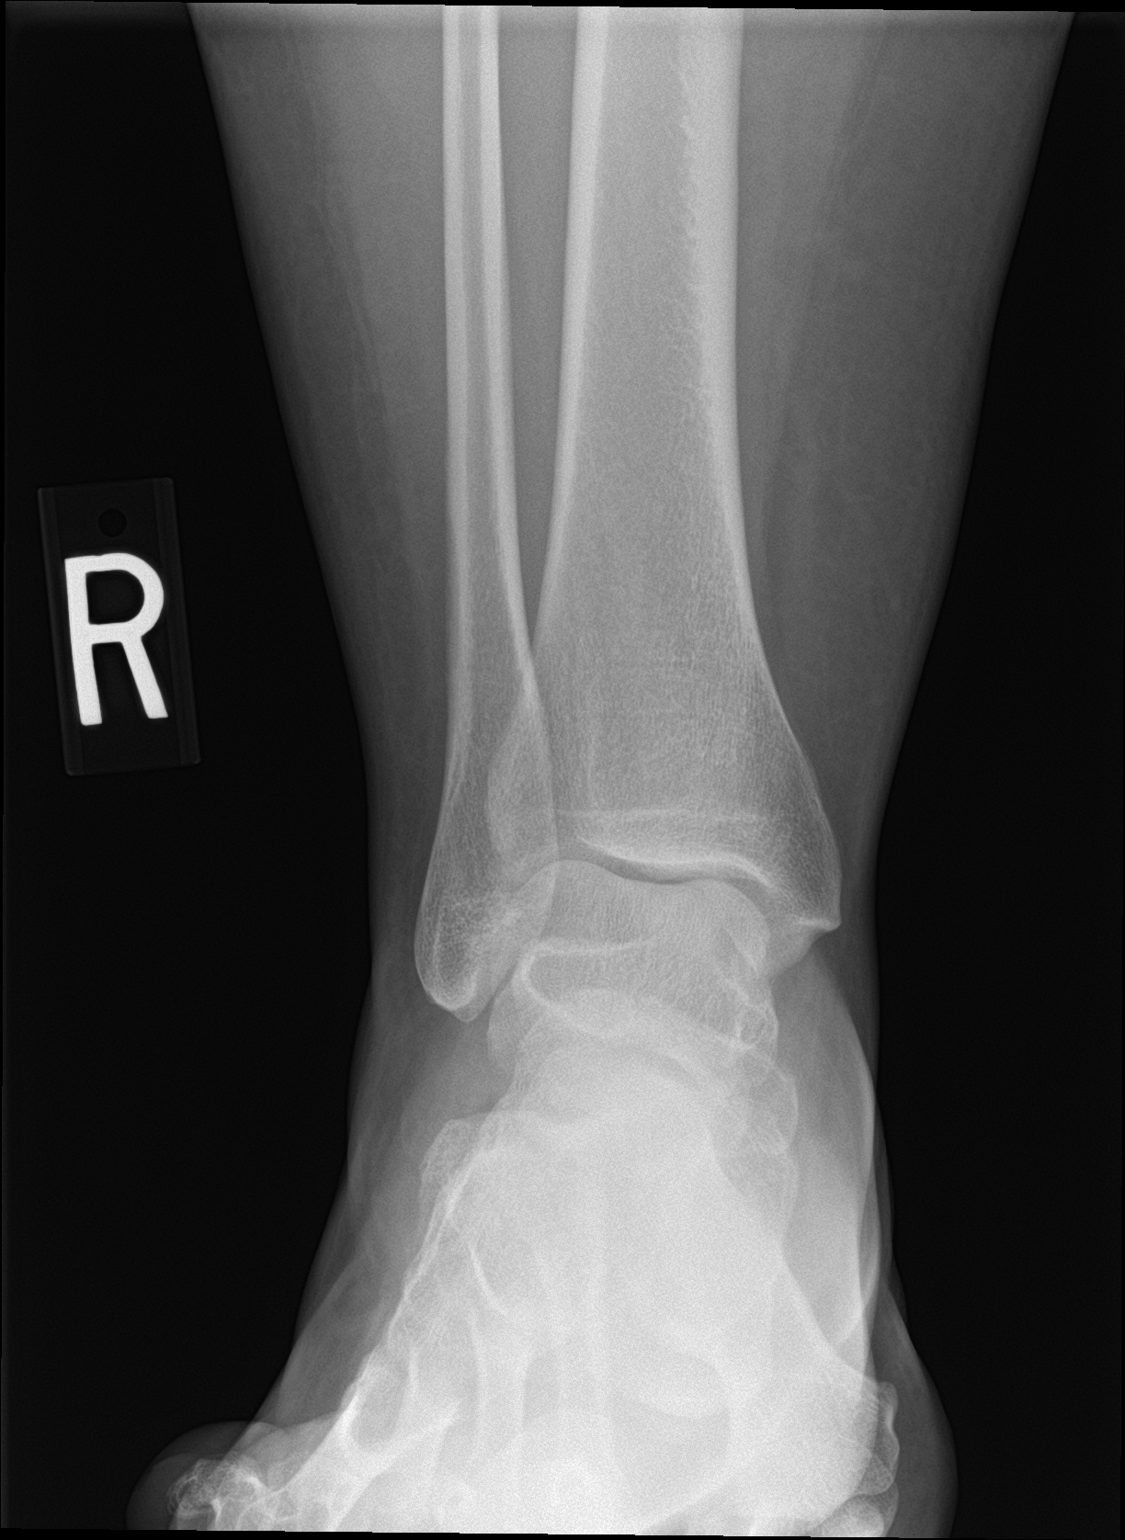

[ankle obl]
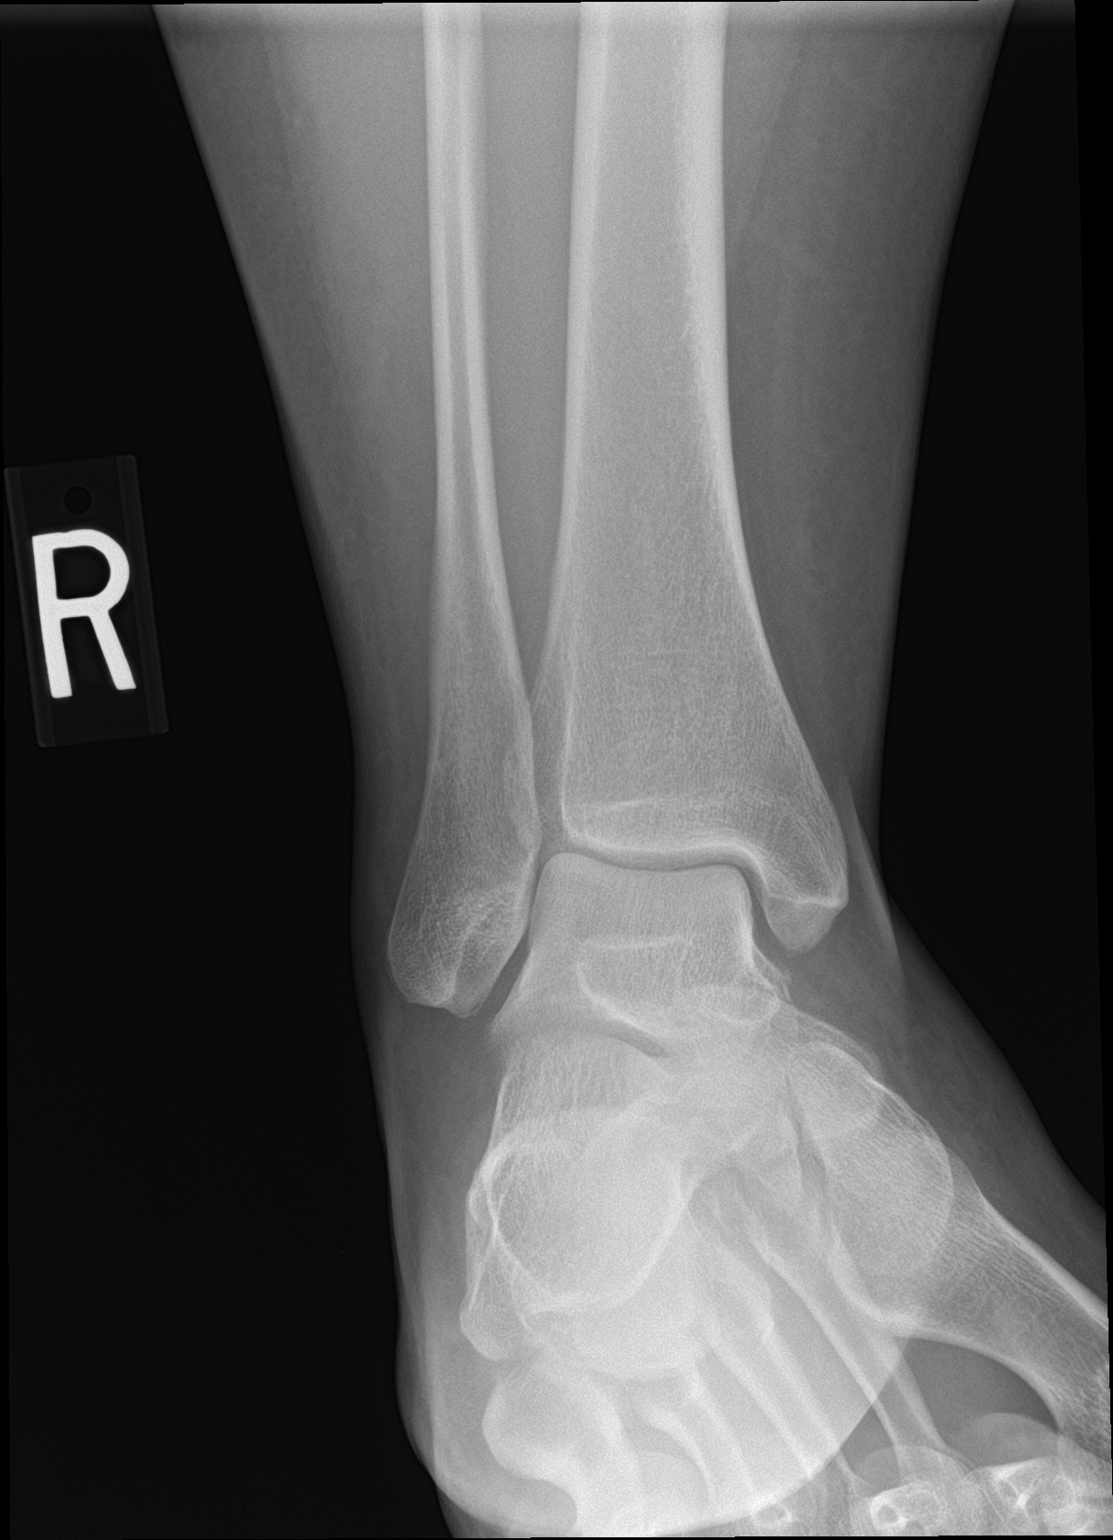

[ankle lat]
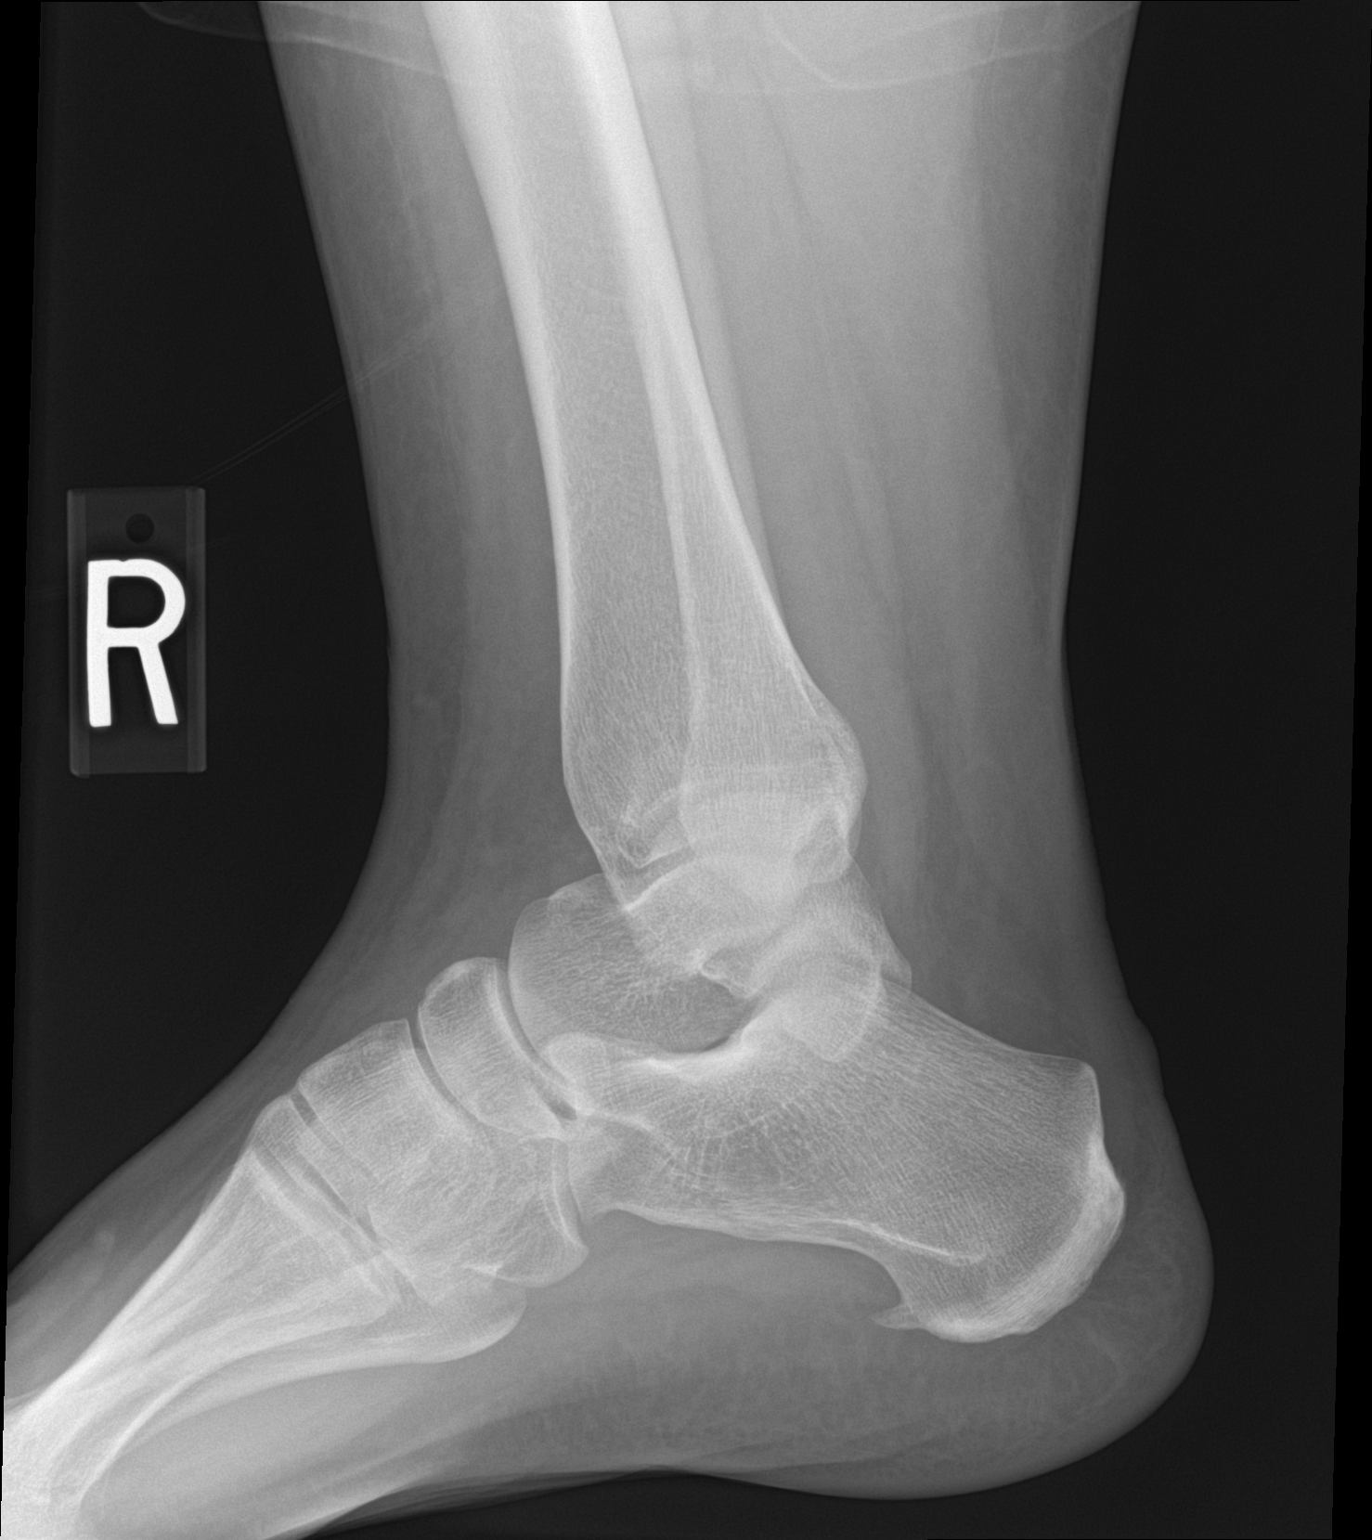

[3 of 3 positions shown; findings below may reference images not displayed]

FINDINGS: There is no evidence of fracture, dislocation, or joint effusion.
There is no evidence of arthropathy or other focal bone abnormality.
Soft tissues are unremarkable. Again noted is a small plantar
calcaneal spur.
IMPRESSION: Negative.

## 2022-08-10 ENCOUNTER — Ambulatory Visit (INDEPENDENT_AMBULATORY_CARE_PROVIDER_SITE_OTHER): Payer: BC Managed Care – PPO | Admitting: Medical-Surgical

## 2022-08-10 VITALS — BP 130/75 | HR 71

## 2022-08-10 DIAGNOSIS — Z79899 Other long term (current) drug therapy: Secondary | ICD-10-CM

## 2022-08-10 DIAGNOSIS — F640 Transsexualism: Secondary | ICD-10-CM | POA: Diagnosis not present

## 2022-08-10 MED ORDER — TESTOSTERONE CYPIONATE 200 MG/ML IM SOLN
100.0000 mg | Freq: Once | INTRAMUSCULAR | Status: AC
Start: 2022-08-10 — End: 2022-08-10
  Administered 2022-08-10: 100 mg via INTRAMUSCULAR

## 2022-08-10 NOTE — Progress Notes (Signed)
Medical screening examination/treatment was performed by qualified clinical staff member and as supervising provider I was immediately available for consultation/collaboration. I have reviewed documentation and agree with assessment and plan. ° °Yeiren Whitecotton L. Sharone Almond, DNP, APRN, FNP-BC °[facility] °Primary Care and Sports Medicine ° °

## 2022-08-10 NOTE — Progress Notes (Signed)
   Established Patient Office Visit  Subjective   Patient ID: Victoria Hill, adult    DOB: Aug 06, 1985  Age: 37 y.o. MRN: [id number]  Chief Complaint  Patient presents with   Injections    HPI  Sereniti Wan is here for a testosterone injection. Denies chest pain, shortness of breath, headaches or mood changes.   ROS    Objective:     BP 130/75   Pulse 71   SpO2 100%    Physical Exam   No results found for any visits on 08/10/22.    The ASCVD Risk score (Arnett DK, et al., 2019) failed to calculate for the following reasons:   The 2019 ASCVD risk score is only valid for ages 48 to 53    Assessment & Plan:  Testosterone injection - Patient tolerated injection well without complications. Patient advised to schedule next injection 7 days from today.    Problem List Items Addressed This Visit       Unprioritized   Transgender person on hormone therapy - Primary    Return in about 1 week (around 08/17/2022) for testosterone injection. Earna Coder, [name], CMA

## 2022-08-17 ENCOUNTER — Ambulatory Visit (INDEPENDENT_AMBULATORY_CARE_PROVIDER_SITE_OTHER): Payer: BC Managed Care – PPO | Admitting: Medical-Surgical

## 2022-08-17 VITALS — BP 120/76 | HR 84 | Ht 64.0 in

## 2022-08-17 DIAGNOSIS — F640 Transsexualism: Secondary | ICD-10-CM | POA: Diagnosis not present

## 2022-08-17 DIAGNOSIS — Z79899 Other long term (current) drug therapy: Secondary | ICD-10-CM | POA: Diagnosis not present

## 2022-08-17 MED ORDER — TESTOSTERONE CYPIONATE 200 MG/ML IM SOLN
100.0000 mg | Freq: Once | INTRAMUSCULAR | Status: AC
Start: 2022-08-17 — End: 2022-08-17
  Administered 2022-08-17: 100 mg via INTRAMUSCULAR

## 2022-08-17 NOTE — Progress Notes (Signed)
Medical screening examination/treatment was performed by qualified clinical staff member and as supervising provider I was immediately available for consultation/collaboration. I have reviewed documentation and agree with assessment and plan. ° °Joy L. Jessup, DNP, APRN, FNP-BC °[facility] °Primary Care and Sports Medicine ° °

## 2022-08-17 NOTE — Progress Notes (Signed)
   Established Patient Office Visit  Subjective   Patient ID: Ray Glacken, adult    DOB: 1985-10-24  Age: 37 y.o. MRN: [id number]  Chief Complaint  Patient presents with   transgender person on hormone therapy    Testosterone injection nurse visit.     HPI  Testosterone injection nurse visit.  Patient denies chest pain, shortness of breath, dizziness, palpitations or medication problems.   ROS    Objective:     BP 120/76   Pulse 84   Ht 5\' 4"  (1.626 m)   SpO2 100%   BMI 36.22 kg/m    Physical Exam   No results found for any visits on 08/17/22.    The ASCVD Risk score (Arnett DK, et al., 2019) failed to calculate for the following reasons:   The 2019 ASCVD risk score is only valid for ages 21 to 52    Assessment & Plan:  Testsosterone injection 100mg  IM LUOQ. Patient tolerated injection well and without complications. Return in 7 days for nurse visit for testosterone injection.  Problem List Items Addressed This Visit   None   No follow-ups on file.    Elizabeth Palau, LPN

## 2022-08-17 NOTE — Patient Instructions (Signed)
Return in 7 days for nurse visit for testosterone injection.

## 2022-08-23 ENCOUNTER — Ambulatory Visit: Payer: BC Managed Care – PPO | Admitting: Medical-Surgical

## 2022-08-23 VITALS — BP 122/72 | HR 76 | Ht 64.0 in

## 2022-08-23 DIAGNOSIS — Z79899 Other long term (current) drug therapy: Secondary | ICD-10-CM | POA: Diagnosis not present

## 2022-08-23 DIAGNOSIS — F640 Transsexualism: Secondary | ICD-10-CM

## 2022-08-23 MED ORDER — TESTOSTERONE CYPIONATE 200 MG/ML IM SOLN
100.0000 mg | Freq: Once | INTRAMUSCULAR | Status: AC
Start: 2022-08-23 — End: 2022-08-23
  Administered 2022-08-23: 100 mg via INTRAMUSCULAR

## 2022-08-23 NOTE — Patient Instructions (Signed)
Return in 7 days for testosterone injection nurse visit. 

## 2022-08-23 NOTE — Progress Notes (Signed)
Medical screening examination/treatment was performed by qualified clinical staff member and as supervising provider I was immediately available for consultation/collaboration. I have reviewed documentation and agree with assessment and plan. ° °Joy L. Jessup, DNP, APRN, FNP-BC °[facility] °Primary Care and Sports Medicine ° °

## 2022-08-23 NOTE — Progress Notes (Signed)
   Established Patient Office Visit  Subjective   Patient ID: Victoria Hill, adult    DOB: [birth year]  Age: 37 y.o. MRN: [id number]  Chief Complaint  Patient presents with   transgender person on hormone therapy    Testosterone injection  nurse visit    HPI  Transgender person on hormone therapy. Testosterone injection nurse visit. Patient denies chest pain, shortness of breath, dizziness, palpitations , mood changes or problems with medication.   ROS    Objective:     BP 122/72   Pulse 76   Ht 5\' 4"  (1.626 m)   SpO2 100%   BMI 36.22 kg/m    Physical Exam   No results found for any visits on 08/23/22.    The ASCVD Risk score (Arnett DK, et al., 2019) failed to calculate for the following reasons:   The 2019 ASCVD risk score is only valid for ages 46 to 2    Assessment & Plan:  Testosterone injection - admin 100mg  IM RUOQ . Patient tolerated injection well without complications. Return in 7 days for testosterone injection.  Problem List Items Addressed This Visit       Other   Transgender person on hormone therapy - Primary    Return in about 1 week (around 08/30/2022) for testosterone injection nurse visit. Elizabeth Palau, LPN

## 2022-08-30 ENCOUNTER — Ambulatory Visit (INDEPENDENT_AMBULATORY_CARE_PROVIDER_SITE_OTHER): Payer: BC Managed Care – PPO | Admitting: Medical-Surgical

## 2022-08-30 VITALS — BP 121/66 | HR 81 | Ht 64.0 in

## 2022-08-30 DIAGNOSIS — Z79899 Other long term (current) drug therapy: Secondary | ICD-10-CM

## 2022-08-30 DIAGNOSIS — F640 Transsexualism: Secondary | ICD-10-CM | POA: Diagnosis not present

## 2022-08-30 MED ORDER — TESTOSTERONE CYPIONATE 200 MG/ML IM SOLN
100.0000 mg | Freq: Once | INTRAMUSCULAR | Status: AC
Start: 2022-08-30 — End: 2022-08-30
  Administered 2022-08-30: 100 mg via INTRAMUSCULAR

## 2022-08-30 NOTE — Patient Instructions (Signed)
Return in 7 days for nurse visit for testosterone injection.

## 2022-08-30 NOTE — Progress Notes (Signed)
   Established Patient Office Visit  Subjective   Patient ID: Victoria Hill, adult    DOB: 1985-09-04  Age: 37 y.o. MRN: [id number]  Chief Complaint  Patient presents with   transgender person on hormone therapy    Testosterone injection nurse visit.     HPI  Testosterone injection nurse visit.  Patient denies chest pain, shortness of breath, dizziness, palpitations,  mood changes or problems with medication.   ROS    Objective:     BP 121/66   Pulse 81   Ht 5\' 4"  (1.626 m)   SpO2 99%   BMI 36.22 kg/m    Physical Exam   No results found for any visits on 08/30/22.    The ASCVD Risk score (Arnett DK, et al., 2019) failed to calculate for the following reasons:   The 2019 ASCVD risk score is only valid for ages 26 to 39    Assessment & Plan:  Testosterone injection . Admin 100mg   IM  LUOQ. Patient tolerated injection well without complications. Return in 7 days for nurse visit for testosterone injection.  Problem List Items Addressed This Visit       Other   Transgender person on hormone therapy - Primary    Return in about 1 week (around 09/06/2022) for Return in 7 days for nurse visit for testosterone injection.Elizabeth Palau, LPN

## 2022-08-30 NOTE — Progress Notes (Signed)
Medical screening examination/treatment was performed by qualified clinical staff member and as supervising provider I was immediately available for consultation/collaboration. I have reviewed documentation and agree with assessment and plan. ° °Joy L. Jessup, DNP, APRN, FNP-BC °[facility] °Primary Care and Sports Medicine ° °

## 2022-09-07 ENCOUNTER — Encounter: Payer: Self-pay | Admitting: Medical-Surgical

## 2022-09-07 ENCOUNTER — Ambulatory Visit (INDEPENDENT_AMBULATORY_CARE_PROVIDER_SITE_OTHER): Payer: BC Managed Care – PPO | Admitting: Medical-Surgical

## 2022-09-07 VITALS — BP 113/75 | HR 74

## 2022-09-07 DIAGNOSIS — Z79899 Other long term (current) drug therapy: Secondary | ICD-10-CM

## 2022-09-07 DIAGNOSIS — F640 Transsexualism: Secondary | ICD-10-CM

## 2022-09-07 MED ORDER — TESTOSTERONE CYPIONATE 200 MG/ML IM SOLN
100.0000 mg | Freq: Once | INTRAMUSCULAR | Status: AC
Start: 2022-09-07 — End: 2022-09-07
  Administered 2022-09-07: 100 mg via INTRAMUSCULAR

## 2022-09-07 NOTE — Progress Notes (Signed)
   Established Patient Office Visit  Subjective   Patient ID: Victoria Hill, adult    DOB: [birth year]  Age: 37 y.o. MRN: [id number]  Chief Complaint  Patient presents with   Injections    HPI  Bobbijo Shevchuk is here for a testosterone injection. Denies chest pain, shortness of breath, headaches or mood changes.   ROS    Objective:     BP 113/75   Pulse 74   SpO2 100%    Physical Exam   No results found for any visits on 09/07/22.    The ASCVD Risk score (Arnett DK, et al., 2019) failed to calculate for the following reasons:   The 2019 ASCVD risk score is only valid for ages 61 to 84    Assessment & Plan:  Testosterone injection - Patient tolerated injection well without complications. Patient advised to schedule next injection 7 days from today.    Problem List Items Addressed This Visit       Unprioritized   Transgender person on hormone therapy - Primary    Return in about 1 week (around 09/14/2022) for testosterone injection. Earna Coder, [name], CMA

## 2022-09-07 NOTE — Progress Notes (Signed)
Medical screening examination/treatment was performed by qualified clinical staff member and as supervising provider I was immediately available for consultation/collaboration. I have reviewed documentation and agree with assessment and plan. ° °Casanova Schurman L. Gedalia Mcmillon, DNP, APRN, FNP-BC °[facility] °Primary Care and Sports Medicine ° °

## 2022-09-11 ENCOUNTER — Ambulatory Visit: Payer: BC Managed Care – PPO | Admitting: Medical-Surgical

## 2022-09-11 ENCOUNTER — Encounter: Payer: Self-pay | Admitting: Medical-Surgical

## 2022-09-11 VITALS — BP 108/74 | HR 82 | Resp 20 | Ht 64.0 in | Wt 209.1 lb

## 2022-09-11 DIAGNOSIS — F32A Depression, unspecified: Secondary | ICD-10-CM

## 2022-09-11 DIAGNOSIS — Z0000 Encounter for general adult medical examination without abnormal findings: Secondary | ICD-10-CM

## 2022-09-11 DIAGNOSIS — F419 Anxiety disorder, unspecified: Secondary | ICD-10-CM | POA: Diagnosis not present

## 2022-09-11 DIAGNOSIS — F640 Transsexualism: Secondary | ICD-10-CM | POA: Diagnosis not present

## 2022-09-11 DIAGNOSIS — Z79899 Other long term (current) drug therapy: Secondary | ICD-10-CM

## 2022-09-11 NOTE — Progress Notes (Signed)
        Established patient visit  History, exam, impression, and plan:  1. Transgender person on hormone therapy Pleasant 37 year old transgender female presenting today for follow up on hormone therapy. Has been treated long-term with testosterone injections and is happy with the results. Coming to the clinic weekly for injections of testosterone cypionate 100mg  IM. Feels well on this dose and denies concerns or side effects. Last testosterone level slightly below goal but had no significant changes in mood or energy level. Rechecking testosterone today. Continue injections as scheduled, titrate dose prn.  - Testosterone  2. Anxiety and depression Taking Lexapro 15mg  daily and Wellbutrin 150mg  daily. Tolerating well without side effects. Feels that the medication is working very well and keeping his mood stable. Mood, affect, thought pattern, and cognition normal.  Denies SI/HI.  Continue Lexapro and Wellbutrin as prescribed.  3. Preventative health care Reviewed recent labs.  Blood counts and lipid panel up-to-date.  Checking metabolic panel today. - CMP14+EGFR   Procedures performed this visit: None.  Return in about 6 months (around 03/14/2023) for HRT follow up.  __________________________________ Thayer Ohm, DNP, APRN, FNP-BC Primary Care and Sports Medicine [facility] [place]

## 2022-09-14 ENCOUNTER — Ambulatory Visit (INDEPENDENT_AMBULATORY_CARE_PROVIDER_SITE_OTHER): Payer: BC Managed Care – PPO | Admitting: Medical-Surgical

## 2022-09-14 VITALS — BP 116/76 | HR 80 | Ht 64.0 in

## 2022-09-14 DIAGNOSIS — F640 Transsexualism: Secondary | ICD-10-CM

## 2022-09-14 DIAGNOSIS — Z79899 Other long term (current) drug therapy: Secondary | ICD-10-CM

## 2022-09-14 MED ORDER — TESTOSTERONE CYPIONATE 200 MG/ML IM SOLN
100.0000 mg | Freq: Once | INTRAMUSCULAR | Status: AC
Start: 2022-09-14 — End: 2022-09-14
  Administered 2022-09-14: 100 mg via INTRAMUSCULAR

## 2022-09-14 NOTE — Progress Notes (Signed)
Medical screening examination/treatment was performed by qualified clinical staff member and as supervising provider I was immediately available for consultation/collaboration. I have reviewed documentation and agree with assessment and plan. ° °Joy L. Jessup, DNP, APRN, FNP-BC °[facility] °Primary Care and Sports Medicine ° °

## 2022-09-14 NOTE — Patient Instructions (Signed)
Return in 7 days for next testosterone injection.

## 2022-09-14 NOTE — Progress Notes (Signed)
   Established Patient Office Visit  Subjective   Patient ID: Victoria Hill, adult    DOB: 1985-06-12  Age: 37 y.o. MRN: [id number]  Chief Complaint  Patient presents with   Transgender person on hormone therapy    Testosterone injection nurse visit.     HPI  Transgender person on hormone therapy- testosterone injection nurse visit- patient denies chest pain, shortness of breath, dizziness, palpitations, or medication problems.   ROS    Objective:     BP 116/76   Pulse 80   Ht 5\' 4"  (1.626 m)   SpO2 100%   BMI 35.90 kg/m    Physical Exam   No results found for any visits on 09/14/22.    The ASCVD Risk score (Arnett DK, et al., 2019) failed to calculate for the following reasons:   The 2019 ASCVD risk score is only valid for ages 71 to 46    Assessment & Plan:  Testosterone injection  admin 100mg  IM LUOQ . Patient tolerated injection well without complications.  Problem List Items Addressed This Visit   None   No follow-ups on file.    Elizabeth Palau, LPN

## 2022-09-21 ENCOUNTER — Ambulatory Visit (INDEPENDENT_AMBULATORY_CARE_PROVIDER_SITE_OTHER): Payer: BC Managed Care – PPO | Admitting: Medical-Surgical

## 2022-09-21 ENCOUNTER — Encounter: Payer: Self-pay | Admitting: Medical-Surgical

## 2022-09-21 ENCOUNTER — Ambulatory Visit: Payer: BC Managed Care – PPO

## 2022-09-21 VITALS — BP 130/85 | HR 94 | Resp 20 | Ht 64.0 in | Wt 209.0 lb

## 2022-09-21 DIAGNOSIS — U071 COVID-19: Secondary | ICD-10-CM | POA: Diagnosis not present

## 2022-09-21 DIAGNOSIS — J3489 Other specified disorders of nose and nasal sinuses: Secondary | ICD-10-CM | POA: Diagnosis not present

## 2022-09-21 DIAGNOSIS — Z79899 Other long term (current) drug therapy: Secondary | ICD-10-CM | POA: Diagnosis not present

## 2022-09-21 DIAGNOSIS — F640 Transsexualism: Secondary | ICD-10-CM

## 2022-09-21 LAB — POC COVID19 BINAXNOW: SARS Coronavirus 2 Ag: POSITIVE — AB

## 2022-09-21 LAB — POCT INFLUENZA A/B
Influenza A, POC: NEGATIVE
Influenza B, POC: NEGATIVE

## 2022-09-21 MED ORDER — TESTOSTERONE CYPIONATE 100 MG/ML IM SOLN
100.0000 mg | Freq: Once | INTRAMUSCULAR | Status: DC
Start: 2022-09-21 — End: 2022-09-21

## 2022-09-21 MED ORDER — TESTOSTERONE CYPIONATE 200 MG/ML IM SOLN
100.0000 mg | Freq: Once | INTRAMUSCULAR | Status: AC
  Administered 2022-09-21: 100 mg via INTRAMUSCULAR

## 2022-09-21 NOTE — Progress Notes (Signed)
        Established patient visit  History, exam, impression, and plan:  1. Transgender person on hormone therapy Pleasant 37 year old transgender female presenting today for testosterone injection.  Up-to-date on labs.  Testosterone injection given in office today. - testosterone cypionate (DEPOTESTOSTERONE CYPIONATE) injection 100 mg  2. COVID-19 virus infection Notes waking this morning with sinus congestion, nonproductive cough, body aches, headache, fatigue, sore throat, and facial pain.  Felt colder this morning than typical.  No fevers, shortness of breath, chest pain, nausea, vomiting, or diarrhea.  POCT influenza negative.  POCT COVID-positive.  Discussed conservative measures.  Offered oral antiviral however this was declined for now.  Work note provided.  CDC quarantine recommendations reviewed.  All questions answered. - POCT Influenza A/B - POC COVID-19   Procedures performed this visit: None.  Return if symptoms worsen or fail to improve.  __________________________________ Thayer Ohm, DNP, APRN, FNP-BC Primary Care and Sports Medicine [facility] [place]

## 2022-09-24 ENCOUNTER — Encounter: Payer: Self-pay | Admitting: Medical-Surgical

## 2022-09-28 ENCOUNTER — Ambulatory Visit: Payer: BC Managed Care – PPO | Admitting: Medical-Surgical

## 2022-09-28 VITALS — BP 126/76 | HR 85 | Ht 64.0 in

## 2022-09-28 DIAGNOSIS — F640 Transsexualism: Secondary | ICD-10-CM | POA: Diagnosis not present

## 2022-09-28 DIAGNOSIS — Z79899 Other long term (current) drug therapy: Secondary | ICD-10-CM | POA: Diagnosis not present

## 2022-09-28 MED ORDER — TESTOSTERONE CYPIONATE 200 MG/ML IM SOLN
100.0000 mg | Freq: Once | INTRAMUSCULAR | Status: AC
Start: 2022-09-28 — End: 2022-09-28
  Administered 2022-09-28: 100 mg via INTRAMUSCULAR

## 2022-09-28 NOTE — Patient Instructions (Signed)
return in 7 days for next testosterone injection.

## 2022-09-28 NOTE — Progress Notes (Signed)
   Established Patient Office Visit  Subjective   Patient ID: Victoria Hill, adult    DOB: 03-15-1985  Age: 37 y.o. MRN: [id number]  Chief Complaint  Patient presents with   transgender person on hormone therapy    Testosterone injection nurse visit.     HPI  Transgender person on hormone therapy- testosterone injection nurse visit. Patient denies chest pain, shortness of breath, dizziness, palpitations or problems with medication.   ROS    Objective:     BP 126/76   Pulse 85   Ht 5\' 4"  (1.626 m)   SpO2 99%   BMI 35.87 kg/m    Physical Exam   No results found for any visits on 09/28/22.    The ASCVD Risk score (Arnett DK, et al., 2019) failed to calculate for the following reasons:   The 2019 ASCVD risk score is only valid for ages 35 to 73    Assessment & Plan:  Testosterone injection admin 100mg  IM LUOQ. Patient tolerated injection well without complications. Return in 7 days for next testosterone injection. Problem List Items Addressed This Visit       Other   Transgender person on hormone therapy - Primary    Return in about 1 week (around 10/05/2022) for return in 7 days for next testosterone injection. Elizabeth Palau, LPN

## 2022-10-05 ENCOUNTER — Ambulatory Visit (INDEPENDENT_AMBULATORY_CARE_PROVIDER_SITE_OTHER): Payer: BC Managed Care – PPO | Admitting: Medical-Surgical

## 2022-10-05 VITALS — BP 118/79 | HR 71 | Ht 64.0 in

## 2022-10-05 DIAGNOSIS — Z79899 Other long term (current) drug therapy: Secondary | ICD-10-CM

## 2022-10-05 DIAGNOSIS — F640 Transsexualism: Secondary | ICD-10-CM

## 2022-10-05 MED ORDER — TESTOSTERONE CYPIONATE 200 MG/ML IM SOLN
100.0000 mg | Freq: Once | INTRAMUSCULAR | Status: AC
Start: 2022-10-05 — End: 2022-10-05
  Administered 2022-10-05: 100 mg via INTRAMUSCULAR

## 2022-10-05 NOTE — Patient Instructions (Signed)
Return in 7 days for testosterone injection nurse visit. 

## 2022-10-05 NOTE — Progress Notes (Signed)
   Established Patient Office Visit  Subjective   Patient ID: Victoria Hill, adult    DOB: 27-Jan-1986  Age: 37 y.o. MRN: [id number]  Chief Complaint  Patient presents with   transgender  person on hormone therapy    Testosterone injection nurse visit.     HPI  Transgender person on hormone therapy- testosterone injecion nurse visit. Patient denies chest pain, shortness of breath, palpitations, mood changes or medication problems.   ROS    Objective:     BP 118/79   Pulse 71   Ht 5\' 4"  (1.626 m)   SpO2 99%   BMI 35.87 kg/m    Physical Exam   No results found for any visits on 10/05/22.    The ASCVD Risk score (Arnett DK, et al., 2019) failed to calculate for the following reasons:   The 2019 ASCVD risk score is only valid for ages 76 to 33    Assessment & Plan:  Testosterone injection admin 100mg  IM RUOQ. Patient tolerated injection well without complications. Return in 7  days for next testosterone injection.  Problem List Items Addressed This Visit       Other   Transgender person on hormone therapy - Primary    Return in about 1 week (around 10/12/2022) for testosterone injection nurse visit. Elizabeth Palau, LPN

## 2022-10-12 ENCOUNTER — Ambulatory Visit (INDEPENDENT_AMBULATORY_CARE_PROVIDER_SITE_OTHER): Payer: BC Managed Care – PPO

## 2022-10-12 VITALS — BP 121/75 | HR 72 | Ht 64.0 in

## 2022-10-12 DIAGNOSIS — F640 Transsexualism: Secondary | ICD-10-CM | POA: Diagnosis not present

## 2022-10-12 DIAGNOSIS — Z79899 Other long term (current) drug therapy: Secondary | ICD-10-CM

## 2022-10-12 MED ORDER — TESTOSTERONE CYPIONATE 200 MG/ML IM SOLN
100.0000 mg | Freq: Once | INTRAMUSCULAR | Status: AC
Start: 2022-10-12 — End: 2022-10-12
  Administered 2022-10-12: 100 mg via INTRAMUSCULAR

## 2022-10-12 NOTE — Patient Instructions (Signed)
Testosterone injection nurse visit.

## 2022-10-12 NOTE — Addendum Note (Signed)
Addended by: Elizabeth Palau on: 10/12/2022 11:29 AM   Modules accepted: Level of Service

## 2022-10-12 NOTE — Progress Notes (Signed)
   Established Patient Office Visit  Subjective   Patient ID: Victoria Hill, adult    DOB: [birth year]  Age: 37 y.o. MRN: [id number]  Chief Complaint  Patient presents with   transgender person on hormone therapy    Testosterone injection nurse visit.     HPI  Testosterone injection nurse visit. Patient denies chest pain, shortness of breath, dizziness, palpitiation, mood changes or problems with medicine.   ROS    Objective:     BP 121/75   Pulse 72   Ht 5\' 4"  (1.626 m)   SpO2 100%   BMI 35.87 kg/m    Physical Exam   No results found for any visits on 10/12/22.    The ASCVD Risk score (Arnett DK, et al., 2019) failed to calculate for the following reasons:   The 2019 ASCVD risk score is only valid for ages 82 to 34    Assessment & Plan:  Testosterone injectio admin 100mg  IM LUOQ. Patient tolerated injection well without complications. Return in 7 days for next testosterone injection nurse visit.  Problem List Items Addressed This Visit       Other   Transgender person on hormone therapy - Primary    Return in about 1 week (around 10/19/2022) for testosterone injection nurse visit.    Elizabeth Palau, LPN

## 2022-10-19 ENCOUNTER — Ambulatory Visit (INDEPENDENT_AMBULATORY_CARE_PROVIDER_SITE_OTHER): Payer: BC Managed Care – PPO

## 2022-10-19 VITALS — BP 114/69 | HR 80 | Ht 64.0 in | Wt 210.9 lb

## 2022-10-19 DIAGNOSIS — Z79899 Other long term (current) drug therapy: Secondary | ICD-10-CM | POA: Diagnosis not present

## 2022-10-19 DIAGNOSIS — F640 Transsexualism: Secondary | ICD-10-CM

## 2022-10-19 MED ORDER — TESTOSTERONE CYPIONATE 200 MG/ML IM SOLN
100.0000 mg | Freq: Once | INTRAMUSCULAR | Status: AC
Start: 2022-10-19 — End: 2022-10-19
  Administered 2022-10-19: 100 mg via INTRAMUSCULAR

## 2022-10-19 NOTE — Progress Notes (Signed)
Established Patient Office Visit  Subjective   Patient ID: Victoria Hill, adult    DOB: 03/31/1985  Age: 37 y.o. MRN: [id number]  Chief Complaint  Patient presents with   Injections    Transgender testosterone injection    HPI  Patient is here for a testosterone injection.Denies chest pain, shortness of breath, headaches and problems with medication or mood changes.  ROS    Objective:     BP 114/69   Pulse 80   Ht 5\' 4"  (1.626 m)   Wt 210 lb 14.4 oz (95.7 kg)   SpO2 100%   BMI 36.20 kg/m    Physical Exam   No results found for any visits on 10/19/22.    The ASCVD Risk score (Arnett DK, et al., 2019) failed to calculate for the following reasons:   The 2019 ASCVD risk score is only valid for ages 59 to 48    Assessment & Plan:  Patient tolerated injection well without complications. Patient advised to schedule next injection in 1 week. Problem List Items Addressed This Visit   None   No follow-ups on file.    Damieon Armendariz  Lind[place], CMA

## 2022-10-19 NOTE — Patient Instructions (Signed)
Patient advised to schedule next injection in 1 week

## 2022-10-26 ENCOUNTER — Ambulatory Visit (INDEPENDENT_AMBULATORY_CARE_PROVIDER_SITE_OTHER): Payer: BC Managed Care – PPO

## 2022-10-26 VITALS — BP 139/80 | HR 77 | Ht 64.0 in | Wt 211.0 lb

## 2022-10-26 DIAGNOSIS — Z79899 Other long term (current) drug therapy: Secondary | ICD-10-CM

## 2022-10-26 DIAGNOSIS — F640 Transsexualism: Secondary | ICD-10-CM

## 2022-10-26 MED ORDER — TESTOSTERONE CYPIONATE 200 MG/ML IM SOLN
100.0000 mg | Freq: Once | INTRAMUSCULAR | Status: AC
Start: 2022-10-26 — End: 2022-10-26
  Administered 2022-10-26: 100 mg via INTRAMUSCULAR

## 2022-10-26 NOTE — Patient Instructions (Signed)
Patient advised to schedule next injection in 1 week

## 2022-10-26 NOTE — Progress Notes (Signed)
Established Patient Office Visit  Subjective   Patient ID: Victoria Hill, adult    DOB: 1985-07-11  Age: 37 y.o. MRN: [id number]  Chief Complaint  Patient presents with   Transgender    Testosterone injection 100 mg nurse visit    HPI Patient is here for testosterone injection.Denies chest pain, shortness of breath, headaches and problems with medication or mood changes.  ROS    Objective:     BP 139/80   Pulse 77   Ht 5\' 4"  (1.626 m)   Wt 211 lb 0.6 oz (95.7 kg)   SpO2 100%   BMI 36.22 kg/m    Physical Exam   No results found for any visits on 10/26/22.    The ASCVD Risk score (Arnett DK, et al., 2019) failed to calculate for the following reasons:   The 2019 ASCVD risk score is only valid for ages 76 to 77    Assessment & Plan:  Patient tolerated injection well without complications.Patient advised to schedule next injection in 1 week. Problem List Items Addressed This Visit       Other   Transgender person on hormone therapy - Primary    Return in about 1 week (around 11/02/2022).    Jailynn Lavalais  Lindsay-Shavers, CMA

## 2022-11-01 ENCOUNTER — Ambulatory Visit (INDEPENDENT_AMBULATORY_CARE_PROVIDER_SITE_OTHER): Payer: BC Managed Care – PPO

## 2022-11-01 DIAGNOSIS — Z79899 Other long term (current) drug therapy: Secondary | ICD-10-CM | POA: Diagnosis not present

## 2022-11-01 DIAGNOSIS — F640 Transsexualism: Secondary | ICD-10-CM

## 2022-11-01 MED ORDER — TESTOSTERONE CYPIONATE 200 MG/ML IM SOLN
100.0000 mg | Freq: Once | INTRAMUSCULAR | Status: AC
Start: 2022-11-01 — End: 2022-11-01
  Administered 2022-11-01: 100 mg via INTRAMUSCULAR

## 2022-11-01 NOTE — Progress Notes (Signed)
   Established Patient Office Visit  Subjective   Patient ID: Victoria Hill, adult    DOB: [birth year]  Age: 37 y.o. MRN: [id number]  Chief Complaint  Patient presents with   Injections    Testosterone    HPI  Victoria Hill is here for a testosterone injection. Denies chest pain, shortness of breath, headaches or mood changes.   ROS    Objective:     There were no vitals taken for this visit.   Physical Exam   No results found for any visits on 11/01/22.    The ASCVD Risk score (Arnett DK, et al., 2019) failed to calculate for the following reasons:   The 2019 ASCVD risk score is only valid for ages 53 to 43    Assessment & Plan:  Testosterone injection - Patient tolerated injection well without complications. Patient advised to schedule next injection 7 days from today.     Problem List Items Addressed This Visit       Unprioritized   Transgender person on hormone therapy - Primary    Return in about 1 week (around 11/08/2022) for testosterone injection. Earna Coder, [name], CMA

## 2022-11-09 ENCOUNTER — Ambulatory Visit (INDEPENDENT_AMBULATORY_CARE_PROVIDER_SITE_OTHER): Payer: BC Managed Care – PPO

## 2022-11-09 VITALS — BP 114/75 | HR 72 | Ht 64.0 in

## 2022-11-09 DIAGNOSIS — Z79899 Other long term (current) drug therapy: Secondary | ICD-10-CM | POA: Diagnosis not present

## 2022-11-09 DIAGNOSIS — F640 Transsexualism: Secondary | ICD-10-CM

## 2022-11-09 MED ORDER — TESTOSTERONE CYPIONATE 200 MG/ML IM SOLN
100.0000 mg | Freq: Once | INTRAMUSCULAR | Status: AC
Start: 2022-11-09 — End: 2022-11-09
  Administered 2022-11-09: 100 mg via INTRAMUSCULAR

## 2022-11-09 NOTE — Progress Notes (Signed)
   Established Patient Office Visit  Subjective   Patient ID: Victoria Hill, adult    DOB: [birth year]  Age: 37 y.o. MRN: [id number]  Chief Complaint  Patient presents with   transgender person on hormone therapy    Testosterone injection nurse visit.     HPI  Transgender person on hormone therapy. Testosterone injection nurse visit., patient denies chest pain, shortness of breath, dizziness, palpitations mood or medication problems.   ROS    Objective:     BP 114/75   Pulse 72   Ht 5\' 4"  (1.626 m)   SpO2 100%   BMI 36.22 kg/m    Physical Exam   No results found for any visits on 11/09/22.    The ASCVD Risk score (Arnett DK, et al., 2019) failed to calculate for the following reasons:   The 2019 ASCVD risk score is only valid for ages 53 to 75    Assessment & Plan:  Testosterone injection nurse visit. Admin 100mg  IM LUOQ. Patient tolerated injection well without complications. Return in 7  days for next testosterone injection.  Problem List Items Addressed This Visit   None   No follow-ups on file.    Elizabeth Palau, LPN

## 2022-11-09 NOTE — Patient Instructions (Signed)
Return in 7 days for nurse visit testosterone injection.

## 2022-11-12 IMAGING — US US PELVIS COMPLETE
1 series · 14 of 25 positions shown · non-contrast
Comparison: None.

CLINICAL DATA: Chronic right lower quadrant pain worsening for the
past 2-3 months,

EXAM:
TRANSABDOMINAL ULTRASOUND OF PELVIS
TECHNIQUE: Transabdominal ultrasound examination of the pelvis was performed
including evaluation of the uterus, ovaries, adnexal regions, and
pelvic cul-de-sac.

[Series 1: us pelvic complete with transvaginal · 14 of 27 slices shown]
[im 1/27]
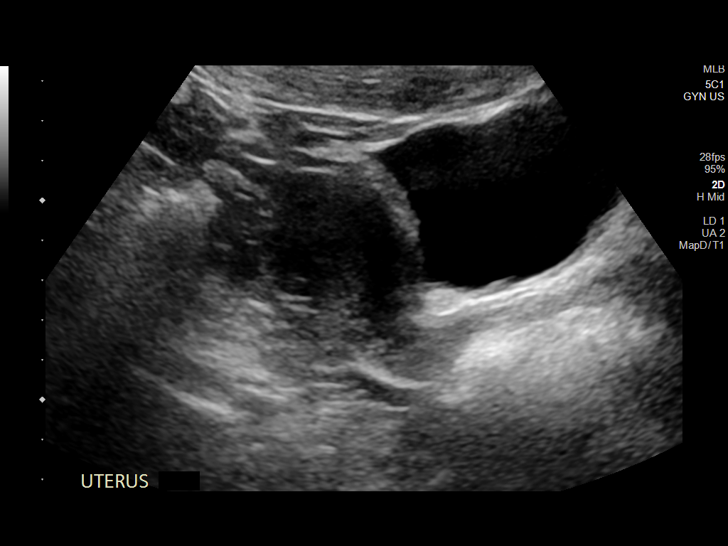
[im 3/27]
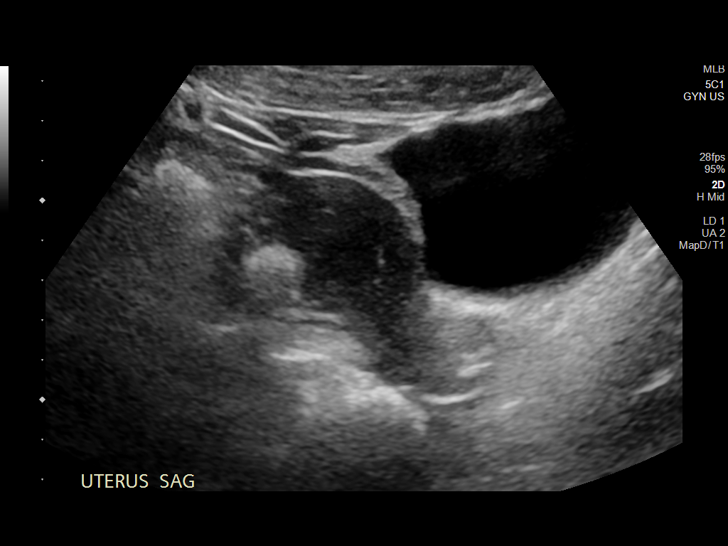
[im 5/27]
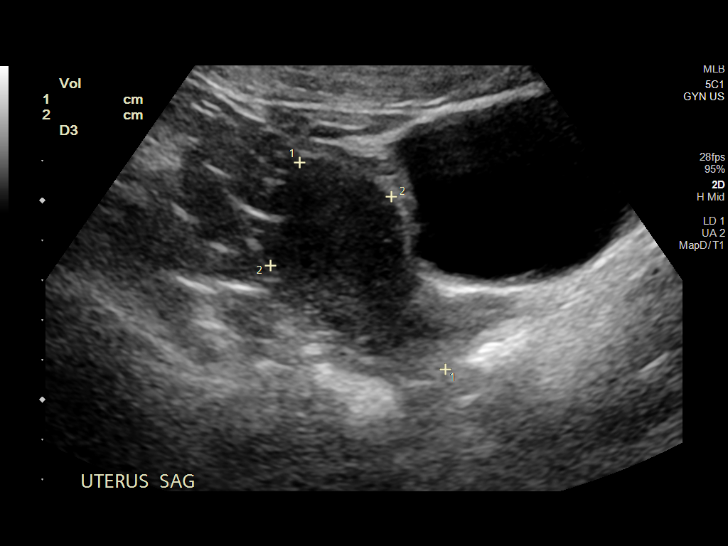
[im 7/27]
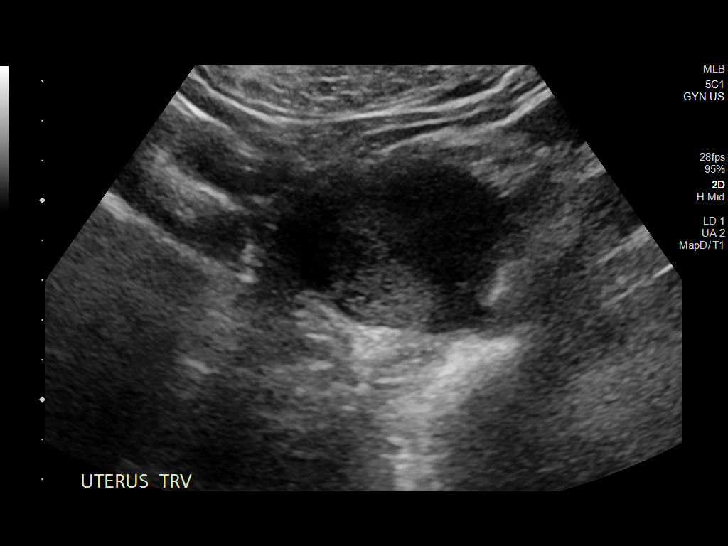
[im 9/27]
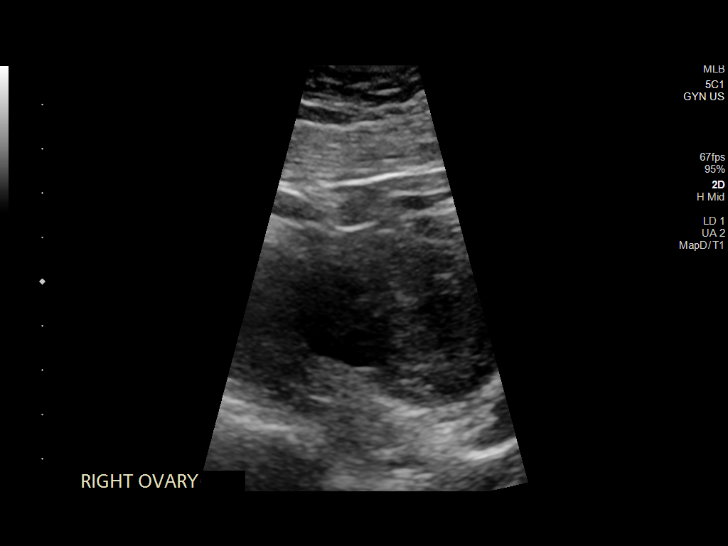
[im 10/27]
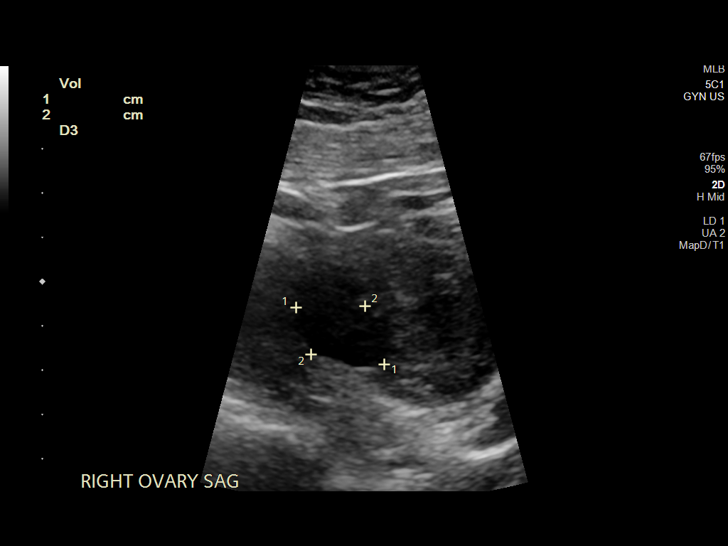
[im 12/27]
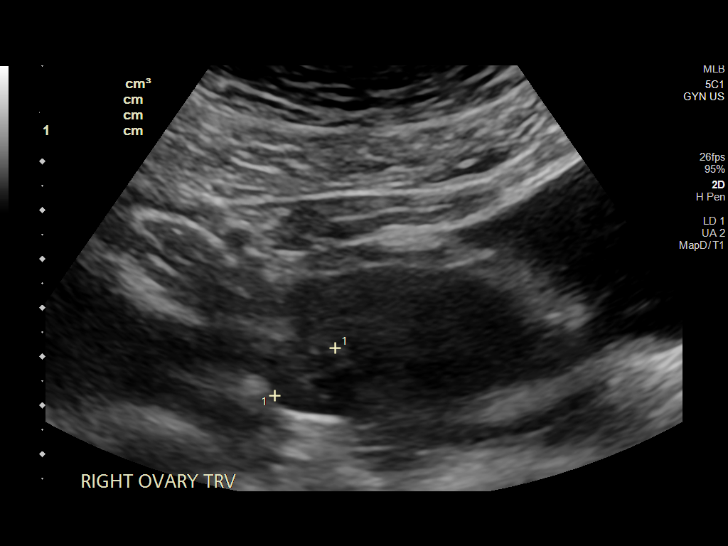
[im 15/27]
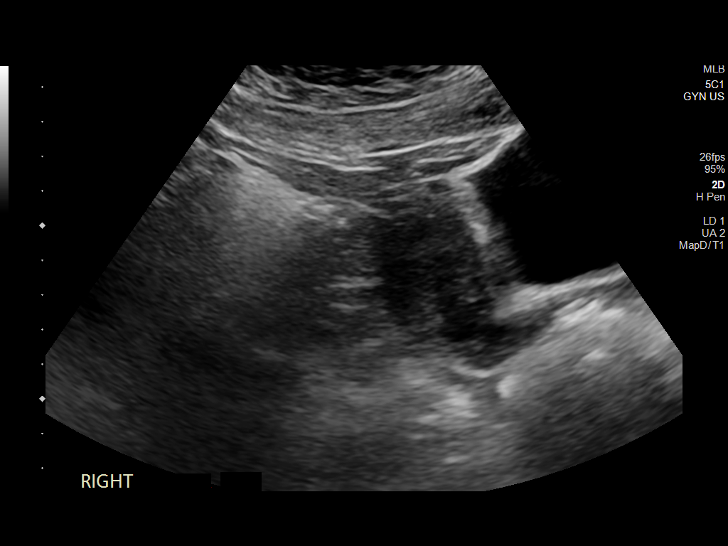
[im 17/27]
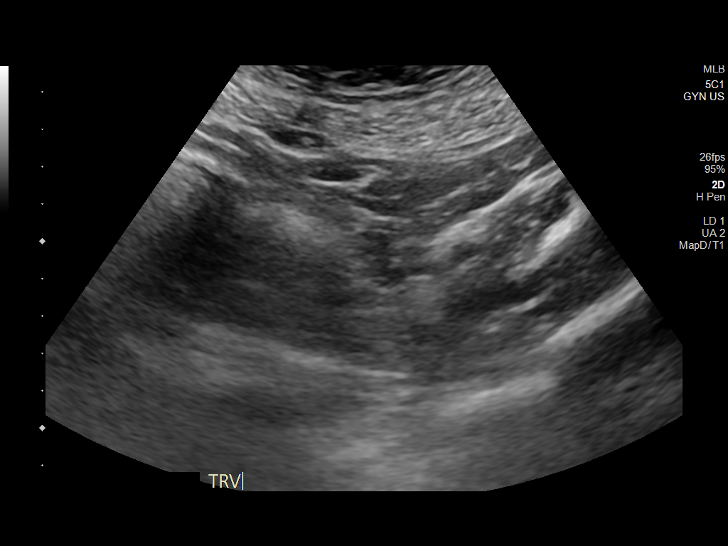
[im 18/27]
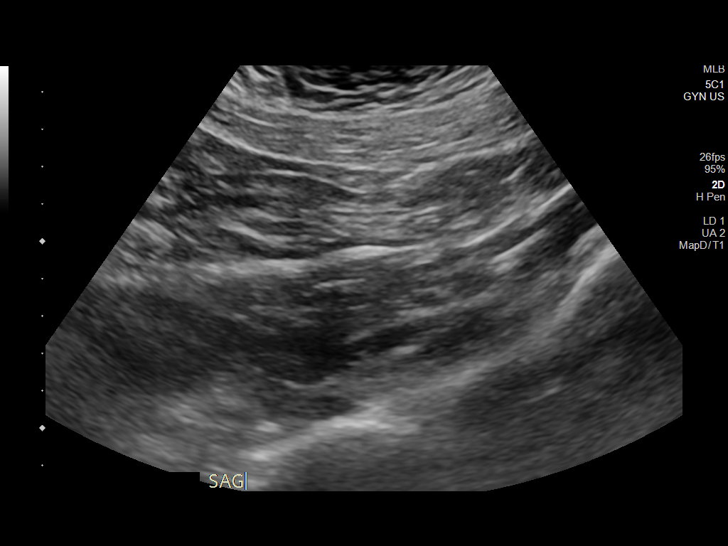
[im 20/27]
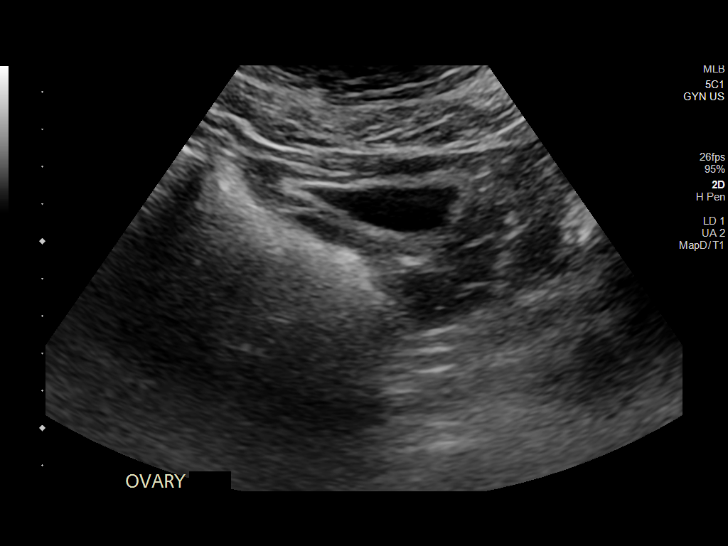
[im 22/27]
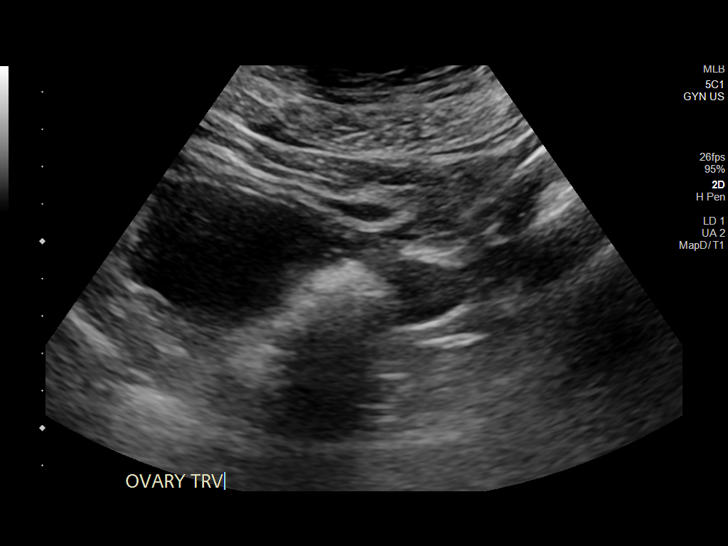
[im 24/27]
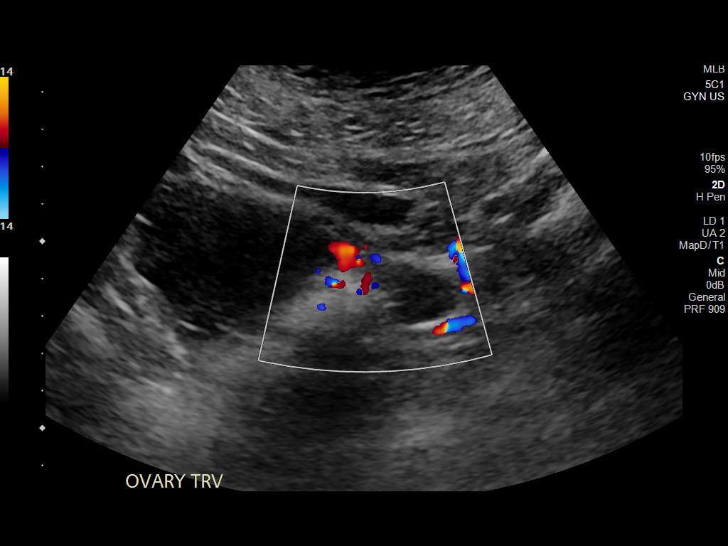
[im 27/27]
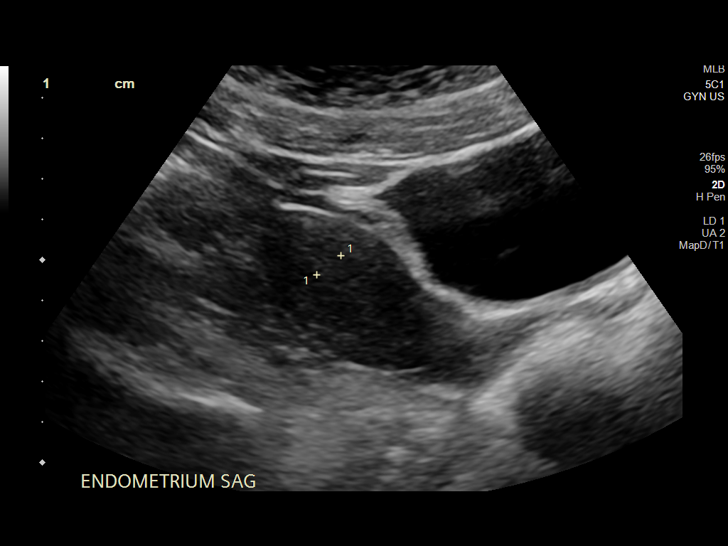

[14 of 25 positions shown; findings below may reference images not displayed]

FINDINGS: Uterus

Measurements: 6.4 x 3.5 x 4.9 cm = volume: 57.2 mL. Anteverted
uterus. No fibroids or other mass visualized.

Endometrium

Thickness: 7.6 mm, non thickened.  No focal abnormality visualized.

Right ovary

Measurements: 2.4 x 1.6 x 1.6 cm = volume: 3.2 mL. Normal
appearance/no adnexal mass.

Left ovary

Measurements: 2.4 x 1.7 x 1.9 cm = volume: 4.1 mL. Normal
appearance/no adnexal mass.

Other findings:  No abnormal free fluid.
IMPRESSION: Unremarkable pelvic ultrasound.

## 2022-11-15 ENCOUNTER — Ambulatory Visit (INDEPENDENT_AMBULATORY_CARE_PROVIDER_SITE_OTHER): Payer: BC Managed Care – PPO

## 2022-11-15 VITALS — BP 118/71 | HR 73 | Ht 64.0 in

## 2022-11-15 DIAGNOSIS — F640 Transsexualism: Secondary | ICD-10-CM | POA: Diagnosis not present

## 2022-11-15 DIAGNOSIS — Z23 Encounter for immunization: Secondary | ICD-10-CM | POA: Diagnosis not present

## 2022-11-15 DIAGNOSIS — Z79899 Other long term (current) drug therapy: Secondary | ICD-10-CM | POA: Diagnosis not present

## 2022-11-15 MED ORDER — TESTOSTERONE CYPIONATE 200 MG/ML IM SOLN
100.0000 mg | Freq: Once | INTRAMUSCULAR | Status: AC
Start: 2022-11-15 — End: 2022-11-15
  Administered 2022-11-15: 100 mg via INTRAMUSCULAR

## 2022-11-15 NOTE — Progress Notes (Signed)
   Established Patient Office Visit  Subjective   Patient ID: Victoria Hill, adult    DOB: [birth year]  Age: 37 y.o. MRN: [id number]  Chief Complaint  Patient presents with   transgender person on hormone therapy    Testosterone injection nurse visit.     HPI  Transgender person on hormone therapy- testosterone injection nurse visit. Patient denies chest pain , shortness of breath, dizziness, palpitations, mood changes or medication problems.   ROS    Objective:     BP 118/71   Pulse 73   Ht 5\' 4"  (1.626 m)   SpO2 100%   BMI 36.22 kg/m    Physical Exam   No results found for any visits on 11/15/22.    The ASCVD Risk score (Arnett DK, et al., 2019) failed to calculate for the following reasons:   The 2019 ASCVD risk score is only valid for ages 3 to 48    Assessment & Plan:  Admin testosterone injection 100mg  IM RUOQ. Patient tolerated injection well without complications. Return in 7 days for next testosterone injection.  Problem List Items Addressed This Visit       Other   Transgender person on hormone therapy - Primary   Other Visit Diagnoses     Encounter for immunization       Relevant Orders   Pfizer Comirnaty Covid-19 Vaccine 78yrs & older (Completed)       Return in about 1 week (around 11/22/2022) for testosterone injection nurse visit. Elizabeth Palau, LPN

## 2022-11-15 NOTE — Patient Instructions (Signed)
Return in 7 days for testosterone injection nurse visit.

## 2022-11-22 ENCOUNTER — Ambulatory Visit (INDEPENDENT_AMBULATORY_CARE_PROVIDER_SITE_OTHER): Payer: BC Managed Care – PPO | Admitting: Medical-Surgical

## 2022-11-22 VITALS — BP 125/77 | HR 78 | Ht 64.0 in | Wt 210.0 lb

## 2022-11-22 DIAGNOSIS — Z79899 Other long term (current) drug therapy: Secondary | ICD-10-CM

## 2022-11-22 DIAGNOSIS — F640 Transsexualism: Secondary | ICD-10-CM

## 2022-11-22 MED ORDER — TESTOSTERONE CYPIONATE 200 MG/ML IM SOLN
100.0000 mg | Freq: Once | INTRAMUSCULAR | Status: AC
Start: 2022-11-22 — End: 2022-11-22
  Administered 2022-11-22: 100 mg via INTRAMUSCULAR

## 2022-11-22 NOTE — Progress Notes (Signed)
Patient is here for a testosterone injection of 100mg /0.69ml.  Given in LUOQ.  Denies chest pain, shortness of breath, headaches and problems with medication or mood changes.  Tolerated injection well without complications.   Patient advised to schedule next injection in 7 days.

## 2022-11-22 NOTE — Progress Notes (Signed)
Medical screening examination/treatment was performed by qualified clinical staff member and as supervising provider I was immediately available for consultation/collaboration. I have reviewed documentation and agree with assessment and plan. ° °Opie Maclaughlin L. Kreston Ahrendt, DNP, APRN, FNP-BC °[facility] °Primary Care and Sports Medicine ° °

## 2022-11-29 ENCOUNTER — Ambulatory Visit (INDEPENDENT_AMBULATORY_CARE_PROVIDER_SITE_OTHER): Payer: BC Managed Care – PPO

## 2022-11-29 DIAGNOSIS — Z79899 Other long term (current) drug therapy: Secondary | ICD-10-CM

## 2022-11-29 DIAGNOSIS — F640 Transsexualism: Secondary | ICD-10-CM

## 2022-11-29 MED ORDER — TESTOSTERONE CYPIONATE 200 MG/ML IM SOLN
100.0000 mg | Freq: Once | INTRAMUSCULAR | Status: AC
Start: 2022-11-29 — End: 2022-11-29
  Administered 2022-11-29: 100 mg via INTRAMUSCULAR

## 2022-11-29 NOTE — Progress Notes (Signed)
   Established Patient Office Visit  Subjective   Patient ID: Victoria Hill, adult    DOB: 07/20/1985  Age: 37 y.o. MRN: [id number]  Chief Complaint  Patient presents with   Injections    Testosterone    HPI  Tamra Vipperman is here for a testosterone injection. Denies chest pain, shortness of breath, headaches or mood changes.   ROS    Objective:     There were no vitals taken for this visit.   Physical Exam   No results found for any visits on 11/29/22.    The ASCVD Risk score (Arnett DK, et al., 2019) failed to calculate for the following reasons:   The 2019 ASCVD risk score is only valid for ages 32 to 34    Assessment & Plan:  Testosterone injection - Patient tolerated injection well without complications. Patient advised to schedule next injection 7 days from today.    Problem List Items Addressed This Visit       Unprioritized   Transgender person on hormone therapy - Primary    Return in about 1 week (around 12/06/2022) for testosterone injection. Earna Coder, [name], CMA

## 2022-12-06 ENCOUNTER — Ambulatory Visit: Payer: BC Managed Care – PPO

## 2022-12-08 ENCOUNTER — Ambulatory Visit (INDEPENDENT_AMBULATORY_CARE_PROVIDER_SITE_OTHER): Payer: BC Managed Care – PPO

## 2022-12-08 DIAGNOSIS — F640 Transsexualism: Secondary | ICD-10-CM | POA: Diagnosis not present

## 2022-12-08 DIAGNOSIS — Z79899 Other long term (current) drug therapy: Secondary | ICD-10-CM | POA: Diagnosis not present

## 2022-12-08 MED ORDER — ESCITALOPRAM OXALATE 5 MG PO TABS: 5.0000 mg | ORAL_TABLET | Freq: Every day | ORAL | 1 refills | Status: DC

## 2022-12-08 MED ORDER — TESTOSTERONE CYPIONATE 200 MG/ML IM SOLN
100.0000 mg | Freq: Once | INTRAMUSCULAR | Status: AC
Start: 2022-12-08 — End: 2022-12-08
  Administered 2022-12-08: 100 mg via INTRAMUSCULAR

## 2022-12-08 NOTE — Progress Notes (Signed)
   Established Patient Office Visit  Subjective   Patient ID: Victoria Hill, adult    DOB: 02/18/1985  Age: 37 y.o. MRN: [id number]  Chief Complaint  Patient presents with   Injections    Testosterone    HPI  Victoria Hill is here for a testosterone injection. Denies chest pain, shortness of breath, headaches or mood changes.  ROS    Objective:     There were no vitals taken for this visit.   Physical Exam   No results found for any visits on 12/08/22.    The ASCVD Risk score (Arnett DK, et al., 2019) failed to calculate for the following reasons:   The 2019 ASCVD risk score is only valid for ages 79 to 66    Assessment & Plan:  Testosterone injection - Patient tolerated injection well without complications. Patient advised to schedule next injection 7 days from today.    Problem List Items Addressed This Visit       Unprioritized   Transgender person on hormone therapy - Primary    Return in about 1 week (around 12/15/2022) for testosterone injection.Earna Coder, [name], CMA

## 2022-12-13 ENCOUNTER — Ambulatory Visit: Payer: BC Managed Care – PPO

## 2022-12-18 ENCOUNTER — Ambulatory Visit (INDEPENDENT_AMBULATORY_CARE_PROVIDER_SITE_OTHER): Payer: BC Managed Care – PPO | Admitting: Medical-Surgical

## 2022-12-18 VITALS — BP 134/75 | HR 76 | Temp 98.4°F | Ht 65.0 in | Wt 210.0 lb

## 2022-12-18 DIAGNOSIS — Z79899 Other long term (current) drug therapy: Secondary | ICD-10-CM

## 2022-12-18 DIAGNOSIS — F640 Transsexualism: Secondary | ICD-10-CM

## 2022-12-18 MED ORDER — TESTOSTERONE CYPIONATE 200 MG/ML IM SOLN
100.0000 mg | Freq: Once | INTRAMUSCULAR | Status: AC
Start: 2022-12-18 — End: 2022-12-18
  Administered 2022-12-18: 100 mg via INTRAMUSCULAR

## 2022-12-18 NOTE — Progress Notes (Signed)
Patient is here for a testosterone injection. Denies chest pain, shortness of breath, headaches and problems with medication or mood changes.   Patient tolerated injection on RUOQ well without complications. Patient advised to schedule next injection in 7 days.

## 2022-12-20 ENCOUNTER — Ambulatory Visit: Payer: BC Managed Care – PPO

## 2022-12-25 ENCOUNTER — Ambulatory Visit: Payer: BC Managed Care – PPO

## 2023-01-08 ENCOUNTER — Ambulatory Visit (INDEPENDENT_AMBULATORY_CARE_PROVIDER_SITE_OTHER): Payer: BC Managed Care – PPO

## 2023-01-08 ENCOUNTER — Ambulatory Visit: Payer: BC Managed Care – PPO | Admitting: Medical-Surgical

## 2023-01-08 VITALS — BP 118/77 | HR 80 | Resp 20 | Ht 65.0 in | Wt 209.1 lb

## 2023-01-08 DIAGNOSIS — F640 Transsexualism: Secondary | ICD-10-CM | POA: Diagnosis not present

## 2023-01-08 DIAGNOSIS — Z79899 Other long term (current) drug therapy: Secondary | ICD-10-CM | POA: Diagnosis not present

## 2023-01-08 MED ORDER — ESCITALOPRAM OXALATE 10 MG PO TABS: 10.0000 mg | ORAL_TABLET | Freq: Every day | ORAL | 3 refills | Status: DC

## 2023-01-08 MED ORDER — ESCITALOPRAM OXALATE 5 MG PO TABS: 5.0000 mg | ORAL_TABLET | Freq: Every day | ORAL | 1 refills | Status: DC

## 2023-01-08 MED ORDER — TESTOSTERONE CYPIONATE 200 MG/ML IM SOLN: 200.0000 mg | Freq: Once | INTRAMUSCULAR | Status: DC

## 2023-01-08 MED ORDER — TESTOSTERONE CYPIONATE 100 MG/ML IM SOLN: 100.0000 mg | Freq: Once | INTRAMUSCULAR | Status: DC

## 2023-01-08 MED ORDER — TESTOSTERONE CYPIONATE 200 MG/ML IM SOLN
100.0000 mg | Freq: Once | INTRAMUSCULAR | Status: AC
  Administered 2023-01-08: 100 mg via INTRAMUSCULAR

## 2023-01-08 NOTE — Progress Notes (Signed)
   Subjective:    Patient ID: Victoria Hill, adult    DOB: December 19, 1985, 37 y.o.   MRN: [id number]  HPI Patient here for a testosterone injection. Denies chest pain, shortness of breath, headaches and problems with medication or mood changes.  Location: [facility]  Review of Systems     Objective:   Physical Exam        Assessment & Plan:   Patient tolerated injection well without complications. Patient advised to schedule next injection in 1 week.

## 2023-01-08 NOTE — Progress Notes (Signed)
Medical screening examination/treatment was performed by qualified clinical staff member and as supervising physician I was immediately available for consultation/collaboration. I have reviewed documentation and agree with assessment and plan.  Ayme Short, DO  

## 2023-01-15 ENCOUNTER — Ambulatory Visit (INDEPENDENT_AMBULATORY_CARE_PROVIDER_SITE_OTHER): Payer: BC Managed Care – PPO

## 2023-01-15 DIAGNOSIS — F640 Transsexualism: Secondary | ICD-10-CM | POA: Diagnosis not present

## 2023-01-15 DIAGNOSIS — Z79899 Other long term (current) drug therapy: Secondary | ICD-10-CM

## 2023-01-15 MED ORDER — TESTOSTERONE CYPIONATE 200 MG/ML IM SOLN
100.0000 mg | Freq: Once | INTRAMUSCULAR | Status: AC
  Administered 2023-01-15: 100 mg via INTRAMUSCULAR

## 2023-01-15 NOTE — Progress Notes (Signed)
   Established Patient Office Visit  Subjective   Patient ID: Victoria Hill, adult    DOB: 1985/04/30  Age: 37 y.o. MRN: [id number]  Chief Complaint  Patient presents with   Injections    HPI  Illona Gouge is here for a testosterone injection. Denies chest pain, shortness of breath, headaches or mood changes.   ROS    Objective:     LMP  (LMP Unknown)    Physical Exam   No results found for any visits on 01/15/23.    The ASCVD Risk score (Arnett DK, et al., 2019) failed to calculate for the following reasons:   The 2019 ASCVD risk score is only valid for ages 19 to 50    Assessment & Plan:  Testosterone injection - Patient tolerated injection well without complications. Patient advised to schedule next injection 7 days from today.    Problem List Items Addressed This Visit       Unprioritized   Transgender person on hormone therapy - Primary    Return in about 1 week (around 01/22/2023) for testosterone injection. Earna Coder, [name], CMA

## 2023-01-25 ENCOUNTER — Ambulatory Visit (INDEPENDENT_AMBULATORY_CARE_PROVIDER_SITE_OTHER): Payer: BC Managed Care – PPO

## 2023-01-25 VITALS — BP 106/74 | HR 76

## 2023-01-25 DIAGNOSIS — Z79899 Other long term (current) drug therapy: Secondary | ICD-10-CM | POA: Diagnosis not present

## 2023-01-25 DIAGNOSIS — F640 Transsexualism: Secondary | ICD-10-CM | POA: Diagnosis not present

## 2023-01-25 MED ORDER — TESTOSTERONE CYPIONATE 200 MG/ML IM SOLN
100.0000 mg | Freq: Once | INTRAMUSCULAR | Status: AC
  Administered 2023-01-25: 100 mg via INTRAMUSCULAR

## 2023-01-25 NOTE — Progress Notes (Signed)
   Established Patient Office Visit  Subjective   Patient ID: Victoria Hill, adult    DOB: 1986-01-25  Age: 37 y.o. MRN: [id number]  Chief Complaint  Patient presents with   transgender person on hormone therapy    Testosterone injection nurse visit    HPI  Testosterone injection nurse visit. Patient denies chest pain, shortness of breath, dizziness, mood or medication  problems.   ROS    Objective:     BP 106/74 (BP Location: Left Arm, Patient Position: Sitting, Cuff Size: Normal)   Pulse 76   SpO2 100%    Physical Exam   No results found for any visits on 01/25/23.    The ASCVD Risk score (Arnett DK, et al., 2019) failed to calculate for the following reasons:   The 2019 ASCVD risk score is only valid for ages 58 to 58    Assessment & Plan:  Testosterone injection admin 100mg  IM LUOQ. Patient tolerated injection  well without complications. Patient will return in 7 days for next testosterone injections.  Problem List Items Addressed This Visit       Other   Transgender person on hormone therapy - Primary    No follow-ups on file.    Elizabeth Palau, LPN

## 2023-01-25 NOTE — Patient Instructions (Signed)
Return in 7  days for next testosterone injection as nurse visit.

## 2023-02-01 ENCOUNTER — Ambulatory Visit: Payer: BC Managed Care – PPO | Admitting: Medical-Surgical

## 2023-02-01 VITALS — BP 112/83 | HR 81 | Ht 64.0 in | Wt 206.5 lb

## 2023-02-01 DIAGNOSIS — Z79899 Other long term (current) drug therapy: Secondary | ICD-10-CM

## 2023-02-01 DIAGNOSIS — F640 Transsexualism: Secondary | ICD-10-CM

## 2023-02-01 MED ORDER — TESTOSTERONE CYPIONATE 100 MG/ML IM SOLN: 100.0000 mg | INTRAMUSCULAR | Status: DC

## 2023-02-01 MED ORDER — TESTOSTERONE CYPIONATE 200 MG/ML IM SOLN
100.0000 mg | Freq: Once | INTRAMUSCULAR | Status: AC
  Administered 2023-02-01: 100 mg via INTRAMUSCULAR

## 2023-02-01 NOTE — Progress Notes (Signed)
 HPI  Pt here today for testosterone  injection. Pt denies CP, SOB, dizziness, mood or medication problems.                 Assessment and Plan:  Testosterone  injection administered 100mg  in RUOQ. pt tolerated well w/o complications. Pt advised to return in 7days for next injection

## 2023-02-08 ENCOUNTER — Ambulatory Visit: Payer: BC Managed Care – PPO

## 2023-02-09 ENCOUNTER — Ambulatory Visit (INDEPENDENT_AMBULATORY_CARE_PROVIDER_SITE_OTHER): Payer: BC Managed Care – PPO | Admitting: Medical-Surgical

## 2023-02-09 VITALS — BP 117/74 | HR 74 | Ht 64.0 in | Wt 206.0 lb

## 2023-02-09 DIAGNOSIS — F640 Transsexualism: Secondary | ICD-10-CM

## 2023-02-09 DIAGNOSIS — Z79899 Other long term (current) drug therapy: Secondary | ICD-10-CM | POA: Diagnosis not present

## 2023-02-09 MED ORDER — TESTOSTERONE CYPIONATE 200 MG/ML IM SOLN
100.0000 mg | Freq: Once | INTRAMUSCULAR | Status: AC
  Administered 2023-02-09: 100 mg via INTRAMUSCULAR

## 2023-02-09 NOTE — Progress Notes (Signed)
 Pt here for testosterone injection.  No SOB,CP or mood swings. Injection tolerated well given in LUOQ.  Pt to RTC in 1 week for next injection.

## 2023-02-14 ENCOUNTER — Ambulatory Visit (INDEPENDENT_AMBULATORY_CARE_PROVIDER_SITE_OTHER): Payer: BC Managed Care – PPO

## 2023-02-14 DIAGNOSIS — Z79899 Other long term (current) drug therapy: Secondary | ICD-10-CM

## 2023-02-14 DIAGNOSIS — F640 Transsexualism: Secondary | ICD-10-CM | POA: Diagnosis not present

## 2023-02-14 MED ORDER — TESTOSTERONE CYPIONATE 200 MG/ML IM SOLN
100.0000 mg | Freq: Once | INTRAMUSCULAR | Status: AC
  Administered 2023-02-14: 100 mg via INTRAMUSCULAR

## 2023-02-14 NOTE — Progress Notes (Signed)
 Patient is here for a testosterone  injection. Denies chest pain, shortness of breath, headaches and problems with medication or mood changes.   Per provider, authorized to give injection 2 days early.   Patient tolerated injection on RUOQ well without complications. Patient advised to schedule next injection in 7 days.

## 2023-02-22 ENCOUNTER — Ambulatory Visit (INDEPENDENT_AMBULATORY_CARE_PROVIDER_SITE_OTHER): Payer: BC Managed Care – PPO | Admitting: Medical-Surgical

## 2023-02-22 VITALS — BP 112/82 | HR 69 | Ht 65.0 in | Wt 204.8 lb

## 2023-02-22 DIAGNOSIS — Z79899 Other long term (current) drug therapy: Secondary | ICD-10-CM | POA: Diagnosis not present

## 2023-02-22 DIAGNOSIS — F640 Transsexualism: Secondary | ICD-10-CM | POA: Diagnosis not present

## 2023-02-22 MED ORDER — TESTOSTERONE CYPIONATE 200 MG/ML IM SOLN: 100.0000 mg | Freq: Once | INTRAMUSCULAR | Status: DC

## 2023-02-22 MED ORDER — TESTOSTERONE CYPIONATE 200 MG/ML IM SOLN
100.0000 mg | Freq: Once | INTRAMUSCULAR | Status: AC
  Administered 2023-02-22: 100 mg via INTRAMUSCULAR

## 2023-02-22 MED ORDER — TESTOSTERONE CYPIONATE 200 MG/ML IM SOLN: 200.0000 mg | Freq: Once | INTRAMUSCULAR | Status: DC

## 2023-02-22 MED ORDER — TESTOSTERONE CYPIONATE 100 MG/ML IM SOLN: 100.0000 mg | Freq: Once | INTRAMUSCULAR | Status: DC

## 2023-02-22 NOTE — Progress Notes (Signed)
HPI  Pt here today to get testosterone injection. Denies CP, headache, SOB, or mood changes.                          Assessment and Plan:  Pt received 100 mg of Testerone in LUOQ. Pt tolerated well. No reaction noted. Pt advised to return in 7 days for next injection. around 03/01/23

## 2023-03-01 ENCOUNTER — Ambulatory Visit (INDEPENDENT_AMBULATORY_CARE_PROVIDER_SITE_OTHER): Payer: BC Managed Care – PPO | Admitting: Medical-Surgical

## 2023-03-01 VITALS — BP 130/86 | HR 85 | Temp 98.5°F | Ht 65.0 in | Wt 205.1 lb

## 2023-03-01 DIAGNOSIS — F640 Transsexualism: Secondary | ICD-10-CM

## 2023-03-01 DIAGNOSIS — Z79899 Other long term (current) drug therapy: Secondary | ICD-10-CM

## 2023-03-01 MED ORDER — TESTOSTERONE CYPIONATE 200 MG/ML IM SOLN
100.0000 mg | Freq: Once | INTRAMUSCULAR | Status: AC
  Administered 2023-03-01: 100 mg via INTRAMUSCULAR

## 2023-03-01 NOTE — Progress Notes (Signed)
Patient is here for a testosterone injection. Denies chest pain, shortness of breath, headaches and problems with medication or mood changes.   Patient tolerated injection on RUOQ well without complications. Patient advised to schedule next injection in 7 days.

## 2023-03-09 ENCOUNTER — Ambulatory Visit (INDEPENDENT_AMBULATORY_CARE_PROVIDER_SITE_OTHER): Payer: BC Managed Care – PPO

## 2023-03-09 VITALS — BP 120/75 | HR 72 | Ht 65.0 in

## 2023-03-09 DIAGNOSIS — F640 Transsexualism: Secondary | ICD-10-CM

## 2023-03-09 DIAGNOSIS — Z79899 Other long term (current) drug therapy: Secondary | ICD-10-CM | POA: Diagnosis not present

## 2023-03-09 MED ORDER — TESTOSTERONE CYPIONATE 200 MG/ML IM SOLN
100.0000 mg | Freq: Once | INTRAMUSCULAR | Status: AC
  Administered 2023-03-09: 100 mg via INTRAMUSCULAR

## 2023-03-09 NOTE — Progress Notes (Signed)
   Established Patient Office Visit  Subjective   Patient ID: Victoria Hill, adult    DOB: 1985-05-19  Age: 38 y.o. MRN: [id number]  Chief Complaint  Patient presents with   transgender person on hormone therapy    Testosterone  injection nurse visit.     HPI  Transgender person on hormone therapy. Testosterone  injection nurse visit. Patient denies chest pain, shortness of breath, dizziness, palpitations , mood changes or med problems.   ROS    Objective:     BP 120/75   Pulse 72   Ht 5' 5 (1.651 m)   SpO2 100%   BMI 34.13 kg/m    Physical Exam   No results found for any visits on 03/09/23.    The 10-year ASCVD risk score (Arnett DK, et al., 2019) is: 1.3%    Assessment & Plan:  Testosterone  injection nurse visit. Admin 100mg  IM LUOQ. Patient tolerated injection well without complications. Patient will return for follow up visit with Victoria Palin, NP on 03/14/23. Problem List Items Addressed This Visit       Other   Transgender person on hormone therapy - Primary    Return for Return for follow up visit with Victoria Jessup, NP scheduled for 03/14/2023.SABRA Suzen SHAUNNA Alpheus, LPN

## 2023-03-09 NOTE — Patient Instructions (Signed)
 Return for follow up visit with Joy Jessup, NP scheduled for 03/14/2023.

## 2023-03-14 ENCOUNTER — Ambulatory Visit: Payer: BC Managed Care – PPO | Admitting: Medical-Surgical

## 2023-03-14 ENCOUNTER — Encounter: Payer: Self-pay | Admitting: Medical-Surgical

## 2023-03-14 VITALS — BP 136/82 | HR 76 | Resp 20 | Ht 65.0 in | Wt 205.0 lb

## 2023-03-14 DIAGNOSIS — F419 Anxiety disorder, unspecified: Secondary | ICD-10-CM | POA: Diagnosis not present

## 2023-03-14 DIAGNOSIS — Z79899 Other long term (current) drug therapy: Secondary | ICD-10-CM

## 2023-03-14 DIAGNOSIS — F32A Depression, unspecified: Secondary | ICD-10-CM | POA: Diagnosis not present

## 2023-03-14 DIAGNOSIS — F640 Transsexualism: Secondary | ICD-10-CM

## 2023-03-14 MED ORDER — BUPROPION HCL ER (XL) 300 MG PO TB24: 300.0000 mg | ORAL_TABLET | ORAL | 1 refills | Status: DC
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
## 2023-03-15 ENCOUNTER — Encounter: Payer: Self-pay | Admitting: Medical-Surgical

## 2023-03-15 LAB — LIPID PANEL
Chol/HDL Ratio: 5.5 {ratio} — ABNORMAL HIGH (ref 0.0–4.4)
Cholesterol, Total: 177 mg/dL (ref 100–199)
HDL: 32 mg/dL — ABNORMAL LOW (ref >39)
LDL Chol Calc (NIH): 115 mg/dL — ABNORMAL HIGH (ref 0–99)
Triglycerides: 167 mg/dL — ABNORMAL HIGH (ref 0–149)
VLDL Cholesterol Cal: 30 mg/dL (ref 5–40)

## 2023-03-15 LAB — CMP14+EGFR
ALT: 30 [IU]/L (ref 0–32)
AST: 21 [IU]/L (ref 0–40)
Albumin: 4.5 g/dL (ref 3.9–4.9)
Alkaline Phosphatase: 69 [IU]/L (ref 44–121)
BUN/Creatinine Ratio: 11 (ref 9–23)
BUN: 12 mg/dL (ref 6–24)
Bilirubin Total: 1.1 mg/dL (ref 0.0–1.2)
CO2: 26 mmol/L (ref 20–29)
Calcium: 9.2 mg/dL (ref 8.7–10.2)
Chloride: 101 mmol/L (ref 96–106)
Creatinine, Ser: 1.07 mg/dL — ABNORMAL HIGH (ref 0.57–1.00)
Globulin, Total: 2.3 g/dL (ref 1.5–4.5)
Glucose: 90 mg/dL (ref 70–99)
Potassium: 3.5 mmol/L (ref 3.5–5.2)
Sodium: 145 mmol/L — ABNORMAL HIGH (ref 134–144)
Total Protein: 6.8 g/dL (ref 6.0–8.5)
eGFR: 67 mL/min/{1.73_m2} (ref >59)

## 2023-03-15 LAB — CBC
Hematocrit: 46.5 % (ref 34.0–46.6)
Hemoglobin: 15.9 g/dL (ref 11.1–15.9)
MCH: 30.3 pg (ref 26.6–33.0)
MCHC: 34.2 g/dL (ref 31.5–35.7)
MCV: 89 fL (ref 79–97)
Platelets: 320 10*3/uL (ref 150–450)
RBC: 5.24 x10E6/uL (ref 3.77–5.28)
RDW: 14.2 % (ref 11.7–15.4)
WBC: 6 10*3/uL (ref 3.4–10.8)

## 2023-03-15 LAB — TESTOSTERONE: Testosterone: 585 ng/dL — ABNORMAL HIGH (ref 8–60)

## 2023-03-20 ENCOUNTER — Ambulatory Visit: Payer: BC Managed Care – PPO

## 2023-03-20 VITALS — BP 120/79 | HR 72 | Ht 65.0 in | Wt 200.2 lb

## 2023-03-20 DIAGNOSIS — F640 Transsexualism: Secondary | ICD-10-CM

## 2023-03-20 DIAGNOSIS — Z79899 Other long term (current) drug therapy: Secondary | ICD-10-CM

## 2023-03-20 MED ORDER — TESTOSTERONE CYPIONATE 200 MG/ML IM SOLN
100.0000 mg | Freq: Once | INTRAMUSCULAR | Status: AC
Start: 2023-03-20 — End: 2023-03-20
  Administered 2023-03-20: 100 mg via INTRAMUSCULAR

## 2023-03-20 MED ORDER — TESTOSTERONE CYPIONATE 100 MG/ML IM SOLN: 100.0000 mg | Freq: Once | INTRAMUSCULAR | Status: DC

## 2023-03-20 NOTE — Progress Notes (Signed)
HPI  Pt here today for testosterone 200 mg injection, denies CP, SOB, mood changes, and headaches,                      Assessment and Plan:  Pt given 100 mg of testosterone in LUOQ. Tolerated well no redness or swelling noted at the site. Pt advised to schedule nest injection in 7 days (around 03/27/23)

## 2023-03-27 ENCOUNTER — Ambulatory Visit (INDEPENDENT_AMBULATORY_CARE_PROVIDER_SITE_OTHER): Payer: BC Managed Care – PPO | Admitting: Medical-Surgical

## 2023-03-27 VITALS — BP 127/74 | HR 79 | Temp 98.2°F | Ht 65.0 in | Wt 201.0 lb

## 2023-03-27 DIAGNOSIS — F640 Transsexualism: Secondary | ICD-10-CM | POA: Diagnosis not present

## 2023-03-27 DIAGNOSIS — Z79899 Other long term (current) drug therapy: Secondary | ICD-10-CM | POA: Diagnosis not present

## 2023-03-27 MED ORDER — TESTOSTERONE CYPIONATE 200 MG/ML IM SOLN
100.0000 mg | Freq: Once | INTRAMUSCULAR | Status: AC
  Administered 2023-03-27: 100 mg via INTRAMUSCULAR

## 2023-03-27 NOTE — Progress Notes (Signed)
 Patient is here for a testosterone injection. Denies chest pain, shortness of breath, headaches and problems with medication or mood changes.   Patient tolerated injection on RUOQ well without complications. Patient advised to schedule next injection in 7 days.

## 2023-03-27 NOTE — Addendum Note (Signed)
 Addended by: Delfino Lovett on: 03/27/2023 04:19 PM   Modules accepted: Level of Service

## 2023-04-03 ENCOUNTER — Ambulatory Visit (INDEPENDENT_AMBULATORY_CARE_PROVIDER_SITE_OTHER): Payer: BC Managed Care – PPO

## 2023-04-03 VITALS — BP 111/70 | HR 96 | Ht 65.0 in

## 2023-04-03 DIAGNOSIS — F640 Transsexualism: Secondary | ICD-10-CM | POA: Diagnosis not present

## 2023-04-03 DIAGNOSIS — Z79899 Other long term (current) drug therapy: Secondary | ICD-10-CM | POA: Diagnosis not present

## 2023-04-03 MED ORDER — TESTOSTERONE CYPIONATE 200 MG/ML IM SOLN
100.0000 mg | Freq: Once | INTRAMUSCULAR | Status: AC
  Administered 2023-04-03: 100 mg via INTRAMUSCULAR

## 2023-04-03 NOTE — Patient Instructions (Signed)
 Return in 7  days for next testosterone injection as nurse visit.

## 2023-04-03 NOTE — Progress Notes (Signed)
   Established Patient Office Visit  Subjective   Patient ID: Jayra Choyce, adult    DOB: [birth year]  Age: 38 y.o. MRN: [id number]  Chief Complaint  Patient presents with   transgender person on hormone therapy    Testosterone injection nurse visit.     HPI  Transgender person on hormone therapy.  Testosterone injection nurse visit. Patient denies chest pain, shortness of breath, dizziness, palpitations, mood or medication problems.   ROS    Objective:     BP 111/70   Pulse 96   Ht 5\' 5"  (1.651 m)   SpO2 100%   BMI 33.45 kg/m    Physical Exam   No results found for any visits on 04/03/23.    The 10-year ASCVD risk score (Arnett DK, et al., 2019) is: 1%    Assessment & Plan:  Testosterone injection nurse visit. Admin 100mg  IM LUOQ. Patient tolerated injection well without complications. Return in 7 days for next testosterone injection as nurse visit.  Problem List Items Addressed This Visit       Other   Transgender person on hormone therapy - Primary    Return in about 1 week (around 04/10/2023) for testosteron injection as nurse visit. Elizabeth Palau, LPN

## 2023-04-10 ENCOUNTER — Ambulatory Visit (INDEPENDENT_AMBULATORY_CARE_PROVIDER_SITE_OTHER): Admitting: Medical-Surgical

## 2023-04-10 VITALS — BP 137/89 | HR 76 | Ht 65.0 in | Wt 201.0 lb

## 2023-04-10 DIAGNOSIS — F640 Transsexualism: Secondary | ICD-10-CM

## 2023-04-10 DIAGNOSIS — Z79899 Other long term (current) drug therapy: Secondary | ICD-10-CM

## 2023-04-10 MED ORDER — TESTOSTERONE CYPIONATE 200 MG/ML IM SOLN
100.0000 mg | Freq: Once | INTRAMUSCULAR | Status: AC
Start: 2023-04-10 — End: 2023-04-10
  Administered 2023-04-10: 100 mg via INTRAMUSCULAR

## 2023-04-10 NOTE — Progress Notes (Signed)
 Pt here for testosterone injection.  No SOB,CP or mood swings.  Injection tolerated well given RUOQ. Pt will RTC in 1 week for next injection.

## 2023-04-17 ENCOUNTER — Ambulatory Visit (INDEPENDENT_AMBULATORY_CARE_PROVIDER_SITE_OTHER): Admitting: Medical-Surgical

## 2023-04-17 VITALS — BP 127/83 | HR 74 | Temp 98.7°F | Ht 65.0 in | Wt 199.1 lb

## 2023-04-17 DIAGNOSIS — Z79899 Other long term (current) drug therapy: Secondary | ICD-10-CM | POA: Diagnosis not present

## 2023-04-17 DIAGNOSIS — F640 Transsexualism: Secondary | ICD-10-CM

## 2023-04-17 MED ORDER — TESTOSTERONE CYPIONATE 200 MG/ML IM SOLN
100.0000 mg | Freq: Once | INTRAMUSCULAR | Status: AC
  Administered 2023-04-17: 100 mg via INTRAMUSCULAR

## 2023-04-17 NOTE — Progress Notes (Signed)
Patient is here for a testosterone injection. Denies chest pain, shortness of breath, headaches and problems with medication or mood changes.   Patient tolerated injection on LUOQ well without complications. Patient advised to schedule next injection in 7 days.   

## 2023-04-17 NOTE — Progress Notes (Signed)
 Medical screening examination/treatment was performed by qualified clinical staff member and as supervising provider I was immediately available for consultation/collaboration. I have reviewed documentation and agree with assessment and plan.  Thayer Ohm, DNP, APRN, FNP-BC [facility] and Sports Medicine

## 2023-04-24 ENCOUNTER — Ambulatory Visit (INDEPENDENT_AMBULATORY_CARE_PROVIDER_SITE_OTHER): Admitting: Medical-Surgical

## 2023-04-24 VITALS — BP 129/85 | HR 70 | Temp 97.9°F | Wt 201.0 lb

## 2023-04-24 DIAGNOSIS — Z79899 Other long term (current) drug therapy: Secondary | ICD-10-CM

## 2023-04-24 DIAGNOSIS — F640 Transsexualism: Secondary | ICD-10-CM

## 2023-04-24 MED ORDER — TESTOSTERONE CYPIONATE 200 MG/ML IM SOLN
100.0000 mg | Freq: Once | INTRAMUSCULAR | Status: AC
Start: 2023-04-24 — End: 2023-04-24
  Administered 2023-04-24: 100 mg via INTRAMUSCULAR

## 2023-04-24 NOTE — Progress Notes (Signed)
 Patient is here for a testosterone injection. Denies chest pain, shortness of breath, headaches and problems with medication or mood changes.   Patient tolerated injection on RUOQ well without complications. Patient advised to schedule next injection in 7 days.

## 2023-05-02 ENCOUNTER — Ambulatory Visit (INDEPENDENT_AMBULATORY_CARE_PROVIDER_SITE_OTHER)

## 2023-05-02 VITALS — BP 124/84 | HR 88 | Ht 65.0 in

## 2023-05-02 DIAGNOSIS — Z79899 Other long term (current) drug therapy: Secondary | ICD-10-CM | POA: Diagnosis not present

## 2023-05-02 DIAGNOSIS — F640 Transsexualism: Secondary | ICD-10-CM | POA: Diagnosis not present

## 2023-05-02 MED ORDER — TESTOSTERONE CYPIONATE 200 MG/ML IM SOLN
200.0000 mg | Freq: Once | INTRAMUSCULAR | Status: AC
  Administered 2023-05-02: 200 mg via INTRAMUSCULAR

## 2023-05-02 NOTE — Patient Instructions (Signed)
 Return in 7  days for next testosterone injection as nurse visit.

## 2023-05-02 NOTE — Progress Notes (Signed)
   Established Patient Office Visit  Subjective   Patient ID: Victoria Hill, adult    DOB: December 22, 1985  Age: 38 y.o. MRN: [id number]  Chief Complaint  Patient presents with   Transgender person on hormone therapy    Testosterone injection nurse visit    HPI  Transgender person on hormone therapy- testosterone injection nurse visit. Patient denies chest pain, shortness of breath, dizziness, headaches or palpitations. Patient does state that has difficulty staying asleep the last few nights that he feels is possibly due to worry about upcoming project .   ROS    Objective:     BP 124/84   Pulse 88   Ht 5\' 5"  (1.651 m)   LMP  (LMP Unknown)   SpO2 99%   BMI 33.45 kg/m    Physical Exam   No results found for any visits on 05/02/23.    The 10-year ASCVD risk score (Arnett DK, et al., 2019) is: 1.2%    Assessment & Plan:  Testosterone injection nurse visit. Admin testosterone 100mg  IM RUOQ . Pateint toerated injection well without complications.  Problem List Items Addressed This Visit       Other   Transgender person on hormone therapy - Primary    Return in about 1 week (around 05/09/2023) for next testosterone injection as nurse visit. Elizabeth Palau, LPN

## 2023-05-09 ENCOUNTER — Ambulatory Visit (INDEPENDENT_AMBULATORY_CARE_PROVIDER_SITE_OTHER): Admitting: Medical-Surgical

## 2023-05-09 VITALS — BP 130/79 | HR 79

## 2023-05-09 DIAGNOSIS — F640 Transsexualism: Secondary | ICD-10-CM | POA: Diagnosis not present

## 2023-05-09 DIAGNOSIS — Z79899 Other long term (current) drug therapy: Secondary | ICD-10-CM | POA: Diagnosis not present

## 2023-05-09 MED ORDER — TESTOSTERONE CYPIONATE 200 MG/ML IM SOLN
100.0000 mg | INTRAMUSCULAR | Status: AC
  Administered 2023-05-09 – 2024-03-06 (×39): 100 mg via INTRAMUSCULAR

## 2023-05-09 NOTE — Progress Notes (Signed)
 Pt here for testosterone injection. Denies CP, SOB, or mood changes.                         Pt given 100mg  testosterone injection in LUOQ. Tolerated well no redness or swelling noted at the site. Pt advised to RTC in 7days.

## 2023-05-16 ENCOUNTER — Ambulatory Visit (INDEPENDENT_AMBULATORY_CARE_PROVIDER_SITE_OTHER): Admitting: Medical-Surgical

## 2023-05-16 VITALS — BP 132/79 | HR 80 | Ht 65.0 in | Wt 205.0 lb

## 2023-05-16 DIAGNOSIS — Z79899 Other long term (current) drug therapy: Secondary | ICD-10-CM | POA: Diagnosis not present

## 2023-05-16 DIAGNOSIS — F640 Transsexualism: Secondary | ICD-10-CM | POA: Diagnosis not present

## 2023-05-16 NOTE — Progress Notes (Signed)
 Pt here today for testosterone injection. Denies SOB, CP, mood changes.                   Pt given 100 mg testosterone in RUOQ. Tolerated well. No redness or swelling noted at site. Pt advised to RTC in 1 wk around 05/23/23.

## 2023-05-22 ENCOUNTER — Ambulatory Visit: Admitting: Medical-Surgical

## 2023-05-22 ENCOUNTER — Encounter: Payer: Self-pay | Admitting: Medical-Surgical

## 2023-05-22 VITALS — BP 124/81 | HR 80 | Resp 20 | Ht 65.0 in | Wt 202.0 lb

## 2023-05-22 DIAGNOSIS — F640 Transsexualism: Secondary | ICD-10-CM

## 2023-05-22 DIAGNOSIS — Z79899 Other long term (current) drug therapy: Secondary | ICD-10-CM | POA: Diagnosis not present

## 2023-05-22 DIAGNOSIS — F3341 Major depressive disorder, recurrent, in partial remission: Secondary | ICD-10-CM | POA: Diagnosis not present

## 2023-05-22 MED ORDER — CARIPRAZINE HCL 1.5 MG PO CAPS: 1.5000 mg | ORAL_CAPSULE | Freq: Every day | ORAL | 1 refills | Status: DC

## 2023-05-22 NOTE — Progress Notes (Signed)
        Established patient visit  History, exam, impression, and plan:  1. Recurrent major depressive disorder, in partial remission (HCC) (Primary) Very pleasant 38 year old transgender female presenting today with a history of major depressive disorder as well as generalized anxiety.  Currently taking Lexapro  15 mg daily, tolerating well without side effects.  Had been started on Wellbutrin  back in February and was taking 300 mg daily.  Unfortunately, developed a severe tremor in the right hand that was only present when writing, drawing, or sketching.  He did some research on his own and was concerned about dystonia.  After looking at Wellbutrin  side effects, tremor was found to be one of them.  He admits to stopping the Wellbutrin  and the tremor has improved and is almost gone.  Only had a small moment of tremor yesterday before it completely resolved and he was able to write/draw without difficulty.  Today reports that he is interested in trying something else for major depression.  Feels that the depression is worse than the anxiety at this point.  Denies SI/HI.  Has taken sertraline  and fluoxetine in the past with poor response.  Feels that the Lexapro  is helpful however it is not controlling symptoms as well as desired.  Wellbutrin  has been added to the list of intolerances.  Discussed options for treating major depressive disorder.  Since we are concerned about weight, avoiding Abilify.  Continue Lexapro  as prescribed.  Plan to start Vraylar  1.5 mg daily and follow-up in 4-6 weeks to evaluate tolerance and response.  2. Transgender person on hormone therapy Doing well on 100 mg testosterone  IM every 7 days.  Happy with the results and levels have proven to be stable.  Injection is due today so we will give this while in office.  Procedures performed this visit: None.  Return in about 6 weeks (around 07/03/2023) for mood follow up.  __________________________________ Maryl Snook, DNP, APRN,  FNP-BC Primary Care and Sports Medicine [facility] [place]

## 2023-05-30 ENCOUNTER — Encounter: Payer: Self-pay | Admitting: Medical-Surgical

## 2023-05-30 ENCOUNTER — Ambulatory Visit (INDEPENDENT_AMBULATORY_CARE_PROVIDER_SITE_OTHER): Admitting: Medical-Surgical

## 2023-05-30 VITALS — BP 129/78 | HR 78 | Ht 65.0 in | Wt 205.0 lb

## 2023-05-30 DIAGNOSIS — F640 Transsexualism: Secondary | ICD-10-CM

## 2023-05-30 DIAGNOSIS — Z79899 Other long term (current) drug therapy: Secondary | ICD-10-CM | POA: Diagnosis not present

## 2023-05-30 NOTE — Progress Notes (Signed)
 Pt  is here today for testosterone  injection. Denies SOB, Chest Palpitations , Mood changes.Pt was given 100 mg testosterone  in LUOQ. Tolerated well. No redness or swelling noted at site. Pt advised to RTC in 1 wk around 06/06/23.

## 2023-06-06 ENCOUNTER — Ambulatory Visit (INDEPENDENT_AMBULATORY_CARE_PROVIDER_SITE_OTHER)

## 2023-06-06 VITALS — BP 126/82 | HR 76 | Ht 65.0 in | Wt 205.0 lb

## 2023-06-06 DIAGNOSIS — Z79899 Other long term (current) drug therapy: Secondary | ICD-10-CM | POA: Diagnosis not present

## 2023-06-06 DIAGNOSIS — F640 Transsexualism: Secondary | ICD-10-CM | POA: Diagnosis not present

## 2023-06-06 MED ORDER — TESTOSTERONE CYPIONATE 200 MG/ML IM SOLN
100.0000 mg | Freq: Once | INTRAMUSCULAR | Status: AC
  Administered 2023-06-06: 100 mg via INTRAMUSCULAR

## 2023-06-06 NOTE — Patient Instructions (Signed)
 Return in 7  days for next injection of testosterone  as nurse visit.

## 2023-06-06 NOTE — Progress Notes (Signed)
   Established Patient Office Visit  Subjective   Patient ID: Anabia Dovalina, adult    DOB: 04/07/1985  Age: 38 y.o. MRN: [id number]  Chief Complaint  Patient presents with   Transgender person on hormone therapy    Testosterone  injection.    HPI  Transgender person on hormone therapy. Patient denies chest pain, shortness of breath, palpitations, mood changes, headaches, or medication problems.   ROS    Objective:     BP 126/82   Pulse 76   Ht 5\' 5"  (1.651 m)   Wt 205 lb (93 kg)   SpO2 100%   BMI 34.11 kg/m    Physical Exam   No results found for any visits on 06/06/23.    The 10-year ASCVD risk score (Arnett DK, et al., 2019) is: 1.3%    Assessment & Plan:  Testosterone  injection admin 100mg  IM RUOQ. Patient tolerated injection well without complications.  Problem List Items Addressed This Visit   None   No follow-ups on file.    Dickie Found, LPN

## 2023-06-13 ENCOUNTER — Ambulatory Visit (INDEPENDENT_AMBULATORY_CARE_PROVIDER_SITE_OTHER)

## 2023-06-13 VITALS — BP 113/70 | HR 71 | Ht 65.0 in

## 2023-06-13 DIAGNOSIS — Z79899 Other long term (current) drug therapy: Secondary | ICD-10-CM

## 2023-06-13 DIAGNOSIS — F640 Transsexualism: Secondary | ICD-10-CM

## 2023-06-13 NOTE — Patient Instructions (Signed)
 Return in 7  days for next testosterone injection as nurse visit.

## 2023-06-13 NOTE — Progress Notes (Signed)
   Established Patient Office Visit  Subjective   Patient ID: Janaa Topping, adult    DOB: 1985-12-02  Age: 38 y.o. MRN: [id number]  Chief Complaint  Patient presents with   transgender person on hormone therapy    Testosterone  injection nurse visit.     HPI  Transgender person on hormone therapy- testosterone  injection nurse visit. Patient denies chest pain, shortness of breath, palpitations, dizziness, headaches, mood changes or medication problems.   ROS    Objective:     BP 113/70   Pulse 71   Ht 5\' 5"  (1.651 m)   SpO2 100%   BMI 34.11 kg/m    Physical Exam   No results found for any visits on 06/13/23.    The 10-year ASCVD risk score (Arnett DK, et al., 2019) is: 1%    Assessment & Plan:  Testosterone  injection . Admin 100mg  IM LUOQ.  Patient tolerated injection well without complications. Return in 7 days for next testosterone  injection as nurse visit.  Problem List Items Addressed This Visit       Other   Transgender person on hormone therapy - Primary    Return in about 1 week (around 06/20/2023) for testosterone  injection nurse visit.Dickie Found, LPN

## 2023-06-20 ENCOUNTER — Ambulatory Visit (INDEPENDENT_AMBULATORY_CARE_PROVIDER_SITE_OTHER)

## 2023-06-20 VITALS — BP 113/55 | HR 76 | Ht 65.0 in

## 2023-06-20 DIAGNOSIS — F640 Transsexualism: Secondary | ICD-10-CM

## 2023-06-20 DIAGNOSIS — Z79899 Other long term (current) drug therapy: Secondary | ICD-10-CM | POA: Diagnosis not present

## 2023-06-20 MED ORDER — TESTOSTERONE CYPIONATE 200 MG/ML IM SOLN
100.0000 mg | Freq: Once | INTRAMUSCULAR | Status: AC
  Administered 2023-06-20: 100 mg via INTRAMUSCULAR

## 2023-06-20 NOTE — Patient Instructions (Signed)
 Return in 7 days for testosterone  injection as nurse visit.

## 2023-06-20 NOTE — Progress Notes (Signed)
   Established Patient Office Visit  Subjective   Patient ID: Victoria Hill, adult    DOB: 07/27/1985  Age: 38 y.o. MRN: [id number]  Chief Complaint  Patient presents with   Transgender person on hormone therapy    Testosterone  injection nurse visit.    HPI  Transgender person on hormone therapy. Testosterone  injection nurse visit. Patient denies chest pain, shortness of breath, dizziness, headaches, vison or mood changes or medication problems.   ROS    Objective:     BP (!) 113/55   Pulse 76   Ht 5\' 5"  (1.651 m)   SpO2 100%   BMI 34.11 kg/m    Physical Exam   No results found for any visits on 06/20/23.    The 10-year ASCVD risk score (Arnett DK, et al., 2019) is: 1%    Assessment & Plan:  Testosterone  injection. Admin 100mg  IM RUOQ. Patient tolerated injection well without complications. Return in 7 days for next testosterone  injection as nurse visit.  Problem List Items Addressed This Visit       Other   Transgender person on hormone therapy - Primary    Return in about 1 week (around 06/27/2023) for testosterone  injection as nurse visit.Dickie Found, LPN

## 2023-06-27 ENCOUNTER — Ambulatory Visit (INDEPENDENT_AMBULATORY_CARE_PROVIDER_SITE_OTHER)

## 2023-06-27 VITALS — BP 123/80 | HR 70 | Temp 98.6°F

## 2023-06-27 DIAGNOSIS — F640 Transsexualism: Secondary | ICD-10-CM | POA: Diagnosis not present

## 2023-06-27 DIAGNOSIS — Z79899 Other long term (current) drug therapy: Secondary | ICD-10-CM | POA: Diagnosis not present

## 2023-06-27 NOTE — Progress Notes (Signed)
 Transgender person on hormone therapy- testosterone  injection nurse visit. Patient denies chest pain, shortness of breath, dizziness, headaches or palpitations.Injection was tolerated LUOQ well by the patient. (See MAR for injection details)

## 2023-07-03 ENCOUNTER — Encounter: Payer: Self-pay | Admitting: Medical-Surgical

## 2023-07-03 ENCOUNTER — Ambulatory Visit: Admitting: Medical-Surgical

## 2023-07-03 VITALS — BP 118/72 | HR 69 | Resp 20 | Ht 65.0 in | Wt 201.9 lb

## 2023-07-03 DIAGNOSIS — Z79899 Other long term (current) drug therapy: Secondary | ICD-10-CM | POA: Diagnosis not present

## 2023-07-03 DIAGNOSIS — F32A Depression, unspecified: Secondary | ICD-10-CM | POA: Diagnosis not present

## 2023-07-03 DIAGNOSIS — R5383 Other fatigue: Secondary | ICD-10-CM | POA: Diagnosis not present

## 2023-07-03 DIAGNOSIS — F419 Anxiety disorder, unspecified: Secondary | ICD-10-CM | POA: Diagnosis not present

## 2023-07-03 DIAGNOSIS — F640 Transsexualism: Secondary | ICD-10-CM | POA: Diagnosis not present

## 2023-07-03 MED ORDER — ESCITALOPRAM OXALATE 5 MG PO TABS: 5.0000 mg | ORAL_TABLET | Freq: Every day | ORAL | 3 refills | Status: DC

## 2023-07-03 MED ORDER — ESCITALOPRAM OXALATE 10 MG PO TABS: 10.0000 mg | ORAL_TABLET | Freq: Every day | ORAL | 3 refills | Status: AC

## 2023-07-03 NOTE — Progress Notes (Signed)
        Established patient visit  History, exam, impression, and plan:  1. Anxiety and depression (Primary) Very pleasant transgender female presenting today to follow-up on anxiety and depression.  At our most recent visit, we had continued the Lexapro  15 mg daily however attempted to add Vraylar  1.5 mg daily.  He was unable to get this medicine and is only doing the Lexapro  15 mg daily.  Feels that the medication is working well and his mood is overall stable but his biggest complaint is fatigue.  See below.  Mood, affect, speech, thought pattern, and cognition all normal today.  Denies SI/HI.  PHQ-9/GAD-7 scores show excellent control.  Continue Lexapro  15 mg daily. - escitalopram  (LEXAPRO ) 10 MG tablet; Take 1 tablet (10 mg total) by mouth daily. Take with 5mg  dose to equal 15mg  daily.  Dispense: 90 tablet; Refill: 3 - escitalopram  (LEXAPRO ) 5 MG tablet; Take 1 tablet (5 mg total) by mouth daily. Take with 10mg  dose to equal 15mg  daily.  Dispense: 90 tablet; Refill: 3  2. Transgender person on hormone therapy Continues testosterone  injections of 100 mg every 7 days.  Injection is due today so given in office.  Up-to-date on testosterone  checks.  3. Fatigue, unspecified type Continues to complain of fatigue as noted above.  Mood is well-managed however he feels like he does not have much energy or motivation to do things.  Reviewed most recent labs and testosterone  results.  No indication there for a cause for the fatigue.  We will add labs as below to evaluate for vitamin or mineral deficiencies as well as thyroid dysfunction. - Iron, TIBC and Ferritin Panel - VITAMIN D 25 Hydroxy (Vit-D Deficiency, Fractures) - Vitamin B12 - TSH  Procedures performed this visit: None.  Return if symptoms worsen or fail to improve.  Keep follow-up as scheduled in August.  __________________________________ Maryl Snook, DNP, APRN, FNP-BC Primary Care and Sports Medicine [facility]  [place]

## 2023-07-04 ENCOUNTER — Ambulatory Visit: Payer: Self-pay | Admitting: Medical-Surgical

## 2023-07-04 LAB — VITAMIN B12: Vitamin B-12: 294 pg/mL (ref 232–1245)

## 2023-07-04 LAB — IRON,TIBC AND FERRITIN PANEL
Ferritin: 101 ng/mL (ref 15–150)
Iron Saturation: 28 % (ref 15–55)
Iron: 85 ug/dL (ref 27–159)
Total Iron Binding Capacity: 308 ug/dL (ref 250–450)
UIBC: 223 ug/dL (ref 131–425)

## 2023-07-04 LAB — TSH: TSH: 1.45 u[IU]/mL (ref 0.450–4.500)

## 2023-07-04 LAB — VITAMIN D 25 HYDROXY (VIT D DEFICIENCY, FRACTURES): Vit D, 25-Hydroxy: 42.2 ng/mL (ref 30.0–100.0)

## 2023-07-09 ENCOUNTER — Ambulatory Visit

## 2023-07-11 ENCOUNTER — Ambulatory Visit (INDEPENDENT_AMBULATORY_CARE_PROVIDER_SITE_OTHER)

## 2023-07-11 VITALS — BP 119/76 | HR 72 | Resp 18 | Ht 65.0 in

## 2023-07-11 DIAGNOSIS — F640 Transsexualism: Secondary | ICD-10-CM | POA: Diagnosis not present

## 2023-07-11 DIAGNOSIS — Z79899 Other long term (current) drug therapy: Secondary | ICD-10-CM | POA: Diagnosis not present

## 2023-07-11 NOTE — Progress Notes (Signed)
 Pt here for his T shot. Denies mood changes. CP, or SOB.                            Pt given 100mg  testosterone  in LUOQ. Tolerated well. No redness or swelling noted at the site. Pt advised to RTC in 1 wk around 07/18/23

## 2023-07-19 ENCOUNTER — Ambulatory Visit (INDEPENDENT_AMBULATORY_CARE_PROVIDER_SITE_OTHER)

## 2023-07-19 VITALS — BP 125/79 | HR 70 | Temp 98.7°F

## 2023-07-19 DIAGNOSIS — Z79899 Other long term (current) drug therapy: Secondary | ICD-10-CM | POA: Diagnosis not present

## 2023-07-19 DIAGNOSIS — F640 Transsexualism: Secondary | ICD-10-CM

## 2023-07-19 NOTE — Progress Notes (Signed)
 Transgender person on hormone therapy- testosterone  injection nurse visit. Patient denies chest pain, shortness of breath, dizziness, headaches or palpitations.Injection was tolerated RUOQ well by the patient. (See MAR for injection details)

## 2023-07-20 ENCOUNTER — Other Ambulatory Visit: Payer: Self-pay | Admitting: Family Medicine

## 2023-07-20 DIAGNOSIS — F32A Depression, unspecified: Secondary | ICD-10-CM

## 2023-07-20 DIAGNOSIS — F419 Anxiety disorder, unspecified: Secondary | ICD-10-CM

## 2023-07-26 ENCOUNTER — Ambulatory Visit (INDEPENDENT_AMBULATORY_CARE_PROVIDER_SITE_OTHER)

## 2023-07-26 VITALS — BP 109/65 | HR 73 | Ht 65.0 in

## 2023-07-26 DIAGNOSIS — F640 Transsexualism: Secondary | ICD-10-CM

## 2023-07-26 DIAGNOSIS — Z79899 Other long term (current) drug therapy: Secondary | ICD-10-CM | POA: Diagnosis not present

## 2023-07-26 NOTE — Patient Instructions (Signed)
 Return in 7  days for next testosterone injection as nurse visit.

## 2023-07-26 NOTE — Progress Notes (Signed)
   Established Patient Office Visit  Subjective   Patient ID: Victoria Hill, adult    DOB: 16-Feb-1985  Age: 38 y.o. MRN: [id number]  Chief Complaint  Patient presents with   Transgender person on hormone therapy    Testosterone  injection - nurse visit    HPI  Transgender person on hormone therapy-Testosterone  injection nurse visit. Patient denies chest pain, shorntess of breath, dizziness, palpitations, headaches, mood changes or medication problems.   ROS    Objective:     BP 109/65   Pulse 73   Ht 5' 5 (1.651 m)   SpO2 100%   BMI 33.60 kg/m    Physical Exam   No results found for any visits on 07/26/23.    The 10-year ASCVD risk score (Arnett DK, et al., 2019) is: 1.3%    Assessment & Plan:  Testosterone  injection - admin 100mg  IM RUOQ . Patient tolerated injection well without complications.  Return in 7 days for next testosterone  injection nurse visit.  Problem List Items Addressed This Visit       Other   Transgender person on hormone therapy - Primary    Return in about 1 week (around 08/02/2023) for testosterone  injection -nurse visit.    Suzen SHAUNNA Plenty, LPN

## 2023-08-02 ENCOUNTER — Ambulatory Visit

## 2023-08-02 VITALS — BP 124/73 | HR 72 | Temp 98.9°F | Ht 65.0 in | Wt 204.1 lb

## 2023-08-02 DIAGNOSIS — F640 Transsexualism: Secondary | ICD-10-CM | POA: Diagnosis not present

## 2023-08-02 DIAGNOSIS — Z79899 Other long term (current) drug therapy: Secondary | ICD-10-CM | POA: Diagnosis not present

## 2023-08-02 MED ORDER — TESTOSTERONE CYPIONATE 200 MG/ML IM SOLN
100.0000 mg | Freq: Once | INTRAMUSCULAR | Status: AC
  Administered 2023-08-02: 100 mg via INTRAMUSCULAR

## 2023-08-02 NOTE — Progress Notes (Signed)
 Patient is here for a testosterone  injection. Denies chest pain, shortness of breath, headaches and problems with medication or mood changes.   Patient tolerated injection on LOUQ well without complications. Patient advised to schedule next injection in 7 days.

## 2023-08-09 ENCOUNTER — Ambulatory Visit (INDEPENDENT_AMBULATORY_CARE_PROVIDER_SITE_OTHER)

## 2023-08-09 VITALS — BP 141/71 | HR 77 | Temp 98.6°F | Ht 65.0 in | Wt 204.0 lb

## 2023-08-09 DIAGNOSIS — Z79899 Other long term (current) drug therapy: Secondary | ICD-10-CM | POA: Diagnosis not present

## 2023-08-09 DIAGNOSIS — F640 Transsexualism: Secondary | ICD-10-CM | POA: Diagnosis not present

## 2023-08-09 NOTE — Patient Instructions (Signed)
Return in one week for next injection

## 2023-08-09 NOTE — Progress Notes (Signed)
 Transgender person on hormone therapy- testosterone  injection nurse visit. Patient denies chest pain, shortness of breath, dizziness, headaches or palpitations.Injection was tolerated LUOQ well by the patient. (See MAR for injection details)

## 2023-08-16 ENCOUNTER — Ambulatory Visit (INDEPENDENT_AMBULATORY_CARE_PROVIDER_SITE_OTHER)

## 2023-08-16 VITALS — BP 126/67 | HR 68 | Temp 98.7°F

## 2023-08-16 DIAGNOSIS — F640 Transsexualism: Secondary | ICD-10-CM | POA: Diagnosis not present

## 2023-08-16 DIAGNOSIS — Z79899 Other long term (current) drug therapy: Secondary | ICD-10-CM

## 2023-08-16 NOTE — Progress Notes (Signed)
 Transgender person on hormone therapy- testosterone  injection nurse visit. Patient denies chest pain, shortness of breath, dizziness, headaches or palpitations.Injection was tolerated RUOQ well by the patient. (See MAR for injection details)

## 2023-08-16 NOTE — Patient Instructions (Addendum)
 Return in one week for testosterone  injection

## 2023-08-23 ENCOUNTER — Ambulatory Visit (INDEPENDENT_AMBULATORY_CARE_PROVIDER_SITE_OTHER)

## 2023-08-23 VITALS — BP 117/80 | HR 74 | Temp 98.1°F | Ht 65.0 in | Wt 204.0 lb

## 2023-08-23 DIAGNOSIS — F640 Transsexualism: Secondary | ICD-10-CM | POA: Diagnosis not present

## 2023-08-23 DIAGNOSIS — Z79899 Other long term (current) drug therapy: Secondary | ICD-10-CM | POA: Diagnosis not present

## 2023-08-23 NOTE — Addendum Note (Signed)
 Addended by: Willy Vorce A on: 08/23/2023 03:11 PM   Modules accepted: Level of Service

## 2023-08-23 NOTE — Progress Notes (Signed)
 Transgender person on hormone therapy- testosterone  injection nurse visit. Patient denies chest pain, shortness of breath, dizziness, headaches or palpitations.Injection was tolerated LUOQ well by the patient. (See MAR for injection details)

## 2023-08-30 ENCOUNTER — Ambulatory Visit (INDEPENDENT_AMBULATORY_CARE_PROVIDER_SITE_OTHER)

## 2023-08-30 VITALS — BP 118/74 | HR 70

## 2023-08-30 DIAGNOSIS — F640 Transsexualism: Secondary | ICD-10-CM | POA: Diagnosis not present

## 2023-08-30 DIAGNOSIS — Z79899 Other long term (current) drug therapy: Secondary | ICD-10-CM

## 2023-08-30 NOTE — Progress Notes (Signed)
 Transgender person on hormone therapy- testosterone  injection nurse visit. Patient denies chest pain, shortness of breath, dizziness, headaches or palpitations.Injection was tolerated RUOQ well by the patient. (See MAR for injection details)

## 2023-09-06 ENCOUNTER — Ambulatory Visit (INDEPENDENT_AMBULATORY_CARE_PROVIDER_SITE_OTHER)

## 2023-09-06 VITALS — BP 110/66 | HR 66 | Ht 65.0 in

## 2023-09-06 DIAGNOSIS — Z79899 Other long term (current) drug therapy: Secondary | ICD-10-CM

## 2023-09-06 DIAGNOSIS — F640 Transsexualism: Secondary | ICD-10-CM | POA: Diagnosis not present

## 2023-09-06 NOTE — Progress Notes (Signed)
   Established Patient Office Visit  Subjective   Patient ID: Victoria Hill, adult    DOB: 01/15/1986  Age: 38 y.o. MRN: [id number]  Chief Complaint  Patient presents with   Transgender person on hormone therapy    Testosterone  injection - nurse visit.     HPI  Transgender person on hormone therapy. Testosterone  injection. Patient denies  chest pain, shortness of breath, palpitations,  dizziness, headaches, mood or medication problems.   ROS    Objective:     BP 110/66   Pulse 66   Ht 5' 5 (1.651 m)   SpO2 100%   BMI 33.95 kg/m    Physical Exam   No results found for any visits on 09/06/23.    The 10-year ASCVD risk score (Arnett DK, et al., 2019) is: 1.3%    Assessment & Plan:  Admin testosterone  100mg  IM LUOQ. Patient tolerated injection well without complications.  Return in 7 days for next testosterone  injection.  Problem List Items Addressed This Visit       Other   Transgender person on hormone therapy - Primary    Return in about 1 week (around 09/13/2023) for testosterone  injection as nurse visit.    Suzen SHAUNNA Plenty, LPN

## 2023-09-06 NOTE — Patient Instructions (Signed)
 Return in 7 days for testosterone  injection as nurse visit.

## 2023-09-11 ENCOUNTER — Encounter: Payer: Self-pay | Admitting: Medical-Surgical

## 2023-09-11 ENCOUNTER — Ambulatory Visit: Payer: BC Managed Care – PPO | Admitting: Medical-Surgical

## 2023-09-11 VITALS — BP 131/78 | HR 76 | Resp 20 | Ht 65.0 in | Wt 204.0 lb

## 2023-09-11 DIAGNOSIS — Z79899 Other long term (current) drug therapy: Secondary | ICD-10-CM | POA: Diagnosis not present

## 2023-09-11 DIAGNOSIS — F640 Transsexualism: Secondary | ICD-10-CM | POA: Diagnosis not present

## 2023-09-11 NOTE — Progress Notes (Signed)
        Established patient visit   History of Present Illness   Discussed the use of AI scribe software for clinical note transcription with the patient, who gave verbal consent to proceed.  History of Present Illness   Victoria Hill is a 38 year old transgender female who presents for a follow-up regarding medication management and hormone therapy.  He has a history of anxiety and is currently taking Lexapro . Previous medications include Wellbutrin , which was discontinued due to tremors, and Vraylar , which caused adverse effects possibly related to interactions with his anti-anxiety medication. He denies any thoughts of self-harm or harm to others.  He is on long-term testosterone  therapy, receiving injections typically on Thursdays. He reports feeling tired. Is otherwise happy with the results of testosterone  therapy.        Physical Exam   Physical Exam Vitals reviewed.  Constitutional:      General: He is not in acute distress.    Appearance: Normal appearance. He is obese. He is not ill-appearing.  HENT:     Head: Normocephalic and atraumatic.  Cardiovascular:     Rate and Rhythm: Normal rate and regular rhythm.     Pulses: Normal pulses.     Heart sounds: Normal heart sounds. No murmur heard.    No friction rub. No gallop.  Pulmonary:     Effort: Pulmonary effort is normal. No respiratory distress.     Breath sounds: Normal breath sounds. No wheezing.  Skin:    General: Skin is warm and dry.  Neurological:     Mental Status: He is alert and oriented to person, place, and time.  Psychiatric:        Mood and Affect: Mood normal.        Behavior: Behavior normal.        Thought Content: Thought content normal.        Judgment: Judgment normal.     Assessment & Plan   Assessment and Plan    Gender-affirming hormone therapy (testosterone ) Continues testosterone  therapy without issues. No signs of blood clots, irritability, or anger. Blood pressure stable. -  Order testosterone  level check on a Monday, post-injection. Do this a few weeks after the normal schedule resumes. - Although early, administer testosterone  injection today then resume normal schedule next week on Thursday. - Order metabolic panel and complete blood count.  Depression and anxiety disorder Managed with Lexapro , well-tolerated. Previous medications discontinued due to side effects. - Continue Lexapro   Fatigue Reports feeling tired. No specific cause identified. - Prior evaluation with no metabolic abnormalities to explain fatigue. - Consider addition of regular intentional exercise and a heart healthy diet with a goal for weight loss.   General Health Maintenance Discussed HPV vaccination. No concerns for hepatitis C or HIV. No cervical cancer screening needed post-hysterectomy. - Offer HPV vaccination, patient declined. - Declined hepatitis C and HIV screening from the list.     Follow up   Return in about 6 months (around 03/13/2024) for GAHT/mood follow up.  __________________________________ Zada FREDRIK Palin, DNP, APRN, FNP-BC Primary Care and Sports Medicine [facility]

## 2023-09-20 ENCOUNTER — Ambulatory Visit (INDEPENDENT_AMBULATORY_CARE_PROVIDER_SITE_OTHER)

## 2023-09-20 DIAGNOSIS — Z79899 Other long term (current) drug therapy: Secondary | ICD-10-CM | POA: Diagnosis not present

## 2023-09-20 DIAGNOSIS — F640 Transsexualism: Secondary | ICD-10-CM

## 2023-09-20 NOTE — Progress Notes (Signed)
 Transgender person on hormone therapy- testosterone  injection nurse visit. Patient denies chest pain, shortness of breath, dizziness, headaches or palpitations.Injection was tolerated LUOQ well by the patient. (See MAR for injection details)

## 2023-09-27 ENCOUNTER — Ambulatory Visit (INDEPENDENT_AMBULATORY_CARE_PROVIDER_SITE_OTHER)

## 2023-09-27 VITALS — BP 137/81 | HR 77 | Temp 98.4°F

## 2023-09-27 DIAGNOSIS — F640 Transsexualism: Secondary | ICD-10-CM

## 2023-09-27 DIAGNOSIS — Z79899 Other long term (current) drug therapy: Secondary | ICD-10-CM

## 2023-09-27 NOTE — Patient Instructions (Signed)
  Return in about 1 week

## 2023-09-27 NOTE — Progress Notes (Signed)
 Transgender person on hormone therapy- testosterone  injection nurse visit. Patient denies chest pain, shortness of breath, dizziness, headaches or palpitations.Injection was tolerated RUOQ well by the patient. (See MAR for injection details)

## 2023-10-02 ENCOUNTER — Encounter: Payer: Self-pay | Admitting: Sports Medicine

## 2023-10-04 ENCOUNTER — Ambulatory Visit (INDEPENDENT_AMBULATORY_CARE_PROVIDER_SITE_OTHER)

## 2023-10-04 VITALS — BP 129/65 | HR 70

## 2023-10-04 DIAGNOSIS — Z79899 Other long term (current) drug therapy: Secondary | ICD-10-CM | POA: Diagnosis not present

## 2023-10-04 DIAGNOSIS — F640 Transsexualism: Secondary | ICD-10-CM | POA: Diagnosis not present

## 2023-10-04 NOTE — Progress Notes (Signed)
 Transgender person on hormone therapy- testosterone  injection nurse visit. Patient denies chest pain, shortness of breath, dizziness, headaches or palpitations.Injection was tolerated LUOQ well by the patient. (See MAR for injection details)

## 2023-10-04 NOTE — Patient Instructions (Signed)
Return in 1 week for next injection

## 2023-10-11 ENCOUNTER — Ambulatory Visit (INDEPENDENT_AMBULATORY_CARE_PROVIDER_SITE_OTHER)

## 2023-10-11 VITALS — BP 124/91

## 2023-10-11 DIAGNOSIS — Z79899 Other long term (current) drug therapy: Secondary | ICD-10-CM

## 2023-10-11 DIAGNOSIS — F640 Transsexualism: Secondary | ICD-10-CM

## 2023-10-11 NOTE — Progress Notes (Addendum)
 Transgender person on hormone therapy- testosterone  injection nurse visit. Patient denies chest pain, shortness of breath, dizziness, headaches or palpitations.Injection was tolerated RUOQ well by the patient. (See MAR for injection details)

## 2023-10-18 ENCOUNTER — Ambulatory Visit (INDEPENDENT_AMBULATORY_CARE_PROVIDER_SITE_OTHER)

## 2023-10-18 VITALS — BP 127/85 | HR 80

## 2023-10-18 DIAGNOSIS — Z79899 Other long term (current) drug therapy: Secondary | ICD-10-CM | POA: Diagnosis not present

## 2023-10-18 DIAGNOSIS — F640 Transsexualism: Secondary | ICD-10-CM

## 2023-10-18 NOTE — Progress Notes (Signed)
 Transgender person on hormone therapy- testosterone  injection nurse visit. Patient denies chest pain, shortness of breath, dizziness, headaches or palpitations.Injection was tolerated LUOQ well by the patient. (See MAR for injection details)

## 2023-10-25 ENCOUNTER — Ambulatory Visit (INDEPENDENT_AMBULATORY_CARE_PROVIDER_SITE_OTHER)

## 2023-10-25 VITALS — BP 104/72 | HR 79 | Resp 17 | Ht 65.0 in

## 2023-10-25 DIAGNOSIS — F640 Transsexualism: Secondary | ICD-10-CM

## 2023-10-25 DIAGNOSIS — Z79899 Other long term (current) drug therapy: Secondary | ICD-10-CM

## 2023-10-25 NOTE — Addendum Note (Signed)
 Addended byBETHA DUWAINE RIGGS on: 10/25/2023 10:54 AM   Modules accepted: Level of Service

## 2023-10-25 NOTE — Progress Notes (Addendum)
 Pt here for testosterone  injection. Pt comes in every wk for 100 mg testosterone .  Patient denies chest pain, shortness of breath, dizziness, headaches or palpitations.Injection was tolerated RUOQ well by the patient. (See MAR for injection details)  pt advisef to RTC in 7 days around 11/01/23 for next injection.

## 2023-11-01 ENCOUNTER — Ambulatory Visit (INDEPENDENT_AMBULATORY_CARE_PROVIDER_SITE_OTHER)

## 2023-11-01 VITALS — BP 117/76 | HR 67 | Resp 17 | Ht 65.0 in

## 2023-11-01 DIAGNOSIS — F640 Transsexualism: Secondary | ICD-10-CM | POA: Diagnosis not present

## 2023-11-01 DIAGNOSIS — Z79899 Other long term (current) drug therapy: Secondary | ICD-10-CM | POA: Diagnosis not present

## 2023-11-01 NOTE — Progress Notes (Signed)
        Transgender person on hormone therapy- testosterone  injection nurse visit. Patient denies chest pain, shortness of breath, dizziness, headaches or palpitations.Injection was tolerated LUOQ well by the patient. (See MAR for injection details) patient advised to RTC in 7 days around 11/08/23.

## 2023-11-07 NOTE — Progress Notes (Unsigned)
 Transgender person on hormone therapy- testosterone  injection nurse visit. Patient denies chest pain, shortness of breath, dizziness, headaches or palpitations.Injection was tolerated RUOQ well by the patient. (See MAR for injection details) patient advised to RTC in 7 days around 11/15/23.

## 2023-11-08 ENCOUNTER — Ambulatory Visit (INDEPENDENT_AMBULATORY_CARE_PROVIDER_SITE_OTHER)

## 2023-11-08 VITALS — BP 127/75 | HR 70 | Resp 17 | Ht 65.0 in

## 2023-11-08 DIAGNOSIS — Z79899 Other long term (current) drug therapy: Secondary | ICD-10-CM

## 2023-11-08 DIAGNOSIS — F640 Transsexualism: Secondary | ICD-10-CM

## 2023-11-14 ENCOUNTER — Ambulatory Visit

## 2023-11-15 ENCOUNTER — Ambulatory Visit (INDEPENDENT_AMBULATORY_CARE_PROVIDER_SITE_OTHER)

## 2023-11-15 VITALS — BP 138/80 | HR 72 | Temp 98.1°F

## 2023-11-15 DIAGNOSIS — F640 Transsexualism: Secondary | ICD-10-CM | POA: Diagnosis not present

## 2023-11-15 DIAGNOSIS — Z79899 Other long term (current) drug therapy: Secondary | ICD-10-CM | POA: Diagnosis not present

## 2023-11-15 NOTE — Progress Notes (Signed)
 Transgender person on hormone therapy- testosterone  injection nurse visit. Patient denies chest pain, shortness of breath, dizziness, headaches or palpitations.Injection was tolerated LUOQ well by the patient. (See MAR for injection details) patient advised to RTC in 7 days around 11/22/23.

## 2023-11-21 NOTE — Progress Notes (Unsigned)
 Transgender person on hormone therapy- testosterone  injection nurse visit. Pt receives 100mg  every 7 days. Patient denies chest pain, shortness of breath, dizziness, headaches or palpitations.Injection was tolerated LUOQ well by the patient. (See MAR for injection details) patient advised to RTC in 7 days around 12/30/23.

## 2023-11-22 ENCOUNTER — Ambulatory Visit (INDEPENDENT_AMBULATORY_CARE_PROVIDER_SITE_OTHER)

## 2023-11-22 VITALS — BP 121/71 | HR 75 | Resp 18 | Ht 65.0 in | Wt 204.0 lb

## 2023-11-22 DIAGNOSIS — F640 Transsexualism: Secondary | ICD-10-CM

## 2023-11-22 DIAGNOSIS — Z79899 Other long term (current) drug therapy: Secondary | ICD-10-CM | POA: Diagnosis not present

## 2023-11-28 NOTE — Progress Notes (Unsigned)
 Transgender person on hormone therapy- testosterone  injection nurse visit. Pt receives 100mg  every 7 days. Patient denies chest pain, shortness of breath, mood changes, headaches or palpitations.  Injection given in RUOQ was tolerated  well by the patient. No redness or swelling noted at the injection site. (See MAR for injection details) patient advised to RTC in 7 days around 12/06/23.

## 2023-11-29 ENCOUNTER — Ambulatory Visit (INDEPENDENT_AMBULATORY_CARE_PROVIDER_SITE_OTHER)

## 2023-11-29 VITALS — BP 129/81 | HR 71 | Resp 18 | Ht 65.0 in

## 2023-11-29 DIAGNOSIS — F640 Transsexualism: Secondary | ICD-10-CM | POA: Diagnosis not present

## 2023-11-29 DIAGNOSIS — Z79899 Other long term (current) drug therapy: Secondary | ICD-10-CM | POA: Diagnosis not present

## 2023-12-06 ENCOUNTER — Ambulatory Visit (INDEPENDENT_AMBULATORY_CARE_PROVIDER_SITE_OTHER)

## 2023-12-06 VITALS — BP 119/78 | HR 73 | Resp 17 | Ht 65.0 in

## 2023-12-06 DIAGNOSIS — Z79899 Other long term (current) drug therapy: Secondary | ICD-10-CM | POA: Diagnosis not present

## 2023-12-06 DIAGNOSIS — F640 Transsexualism: Secondary | ICD-10-CM

## 2023-12-06 NOTE — Progress Notes (Signed)
 Transgender person on hormone therapy- testosterone  injection nurse visit. Pt receives 100mg  every 7 days. Patient denies chest pain, shortness of breath, mood changes, headaches or palpitations.   Injection given in LUOQ was tolerated  well by the patient. No redness or swelling noted at the injection site. (See MAR for injection details) patient advised to RTC in 7 days around 12/13/23.

## 2023-12-12 NOTE — Progress Notes (Signed)
   Subjective:    Patient ID: Victoria Hill, adult    DOB: 10-09-1985, 38 y.o.   MRN: [id number]  HPI  Transgender person on hormone therapy- testosterone  injection nurse visit. Pt receives 100mg  every 7 days. Patient denies chest pain, shortness of breath, mood changes, headaches or palpitations.     Review of Systems     Objective:           Assessment & Plan:   Injection given in RUOQ was tolerated  well by the patient. No redness or swelling noted at the injection site. Patient advised to RTC in 7 days around 12/20/23.

## 2023-12-13 ENCOUNTER — Ambulatory Visit

## 2023-12-13 VITALS — BP 131/90 | HR 82 | Resp 20 | Ht 65.0 in | Wt 204.0 lb

## 2023-12-13 DIAGNOSIS — Z79899 Other long term (current) drug therapy: Secondary | ICD-10-CM

## 2023-12-13 DIAGNOSIS — F640 Transsexualism: Secondary | ICD-10-CM

## 2023-12-19 NOTE — Progress Notes (Unsigned)
   Subjective:    Patient ID: Victoria Hill, adult    DOB: [birth year], 39 y.o.   MRN: [id number]  HPI  Transgender person on hormone therapy- testosterone  injection nurse visit. Pt receives 100mg  every 7 days. Patient denies chest pain, shortness of breath, mood changes, headaches or palpitations.    Review of Systems     Objective:   Physical Exam        Assessment & Plan:   Injection given in LUOQ was tolerated  well by the patient. No redness or swelling noted at the injection site. Patient advised to RTC in 7 days around 12/27/23.

## 2023-12-20 ENCOUNTER — Ambulatory Visit

## 2023-12-20 VITALS — BP 120/66 | HR 69 | Resp 18 | Ht 65.0 in | Wt 204.0 lb

## 2023-12-20 DIAGNOSIS — F640 Transsexualism: Secondary | ICD-10-CM

## 2023-12-20 DIAGNOSIS — Z79899 Other long term (current) drug therapy: Secondary | ICD-10-CM

## 2023-12-25 NOTE — Progress Notes (Unsigned)
   Subjective:    Patient ID: Victoria Hill, adult    DOB: 1985/12/01, 38 y.o.   MRN: [id number]  HPI  Transgender person on hormone therapy- testosterone  injection nurse visit. Pt receives 100mg  every 7 days. Patient denies chest pain, shortness of breath, mood changes, headaches or palpitations.   Review of Systems     Objective:   Physical Exam        Assessment & Plan:   Injection given in RUOQ was tolerated  well by the patient. No redness or swelling noted at the injection site. Patient advised to RTC in 7 days around 01/02/24.

## 2023-12-26 ENCOUNTER — Ambulatory Visit (INDEPENDENT_AMBULATORY_CARE_PROVIDER_SITE_OTHER)

## 2023-12-26 VITALS — BP 114/83 | HR 73 | Resp 18 | Ht 65.0 in | Wt 204.0 lb

## 2023-12-26 DIAGNOSIS — F640 Transsexualism: Secondary | ICD-10-CM

## 2023-12-26 DIAGNOSIS — Z79899 Other long term (current) drug therapy: Secondary | ICD-10-CM

## 2024-01-03 ENCOUNTER — Ambulatory Visit (INDEPENDENT_AMBULATORY_CARE_PROVIDER_SITE_OTHER)

## 2024-01-03 VITALS — BP 127/74 | HR 76 | Resp 18 | Ht 60.0 in | Wt 204.0 lb

## 2024-01-03 DIAGNOSIS — F640 Transsexualism: Secondary | ICD-10-CM

## 2024-01-03 DIAGNOSIS — Z79899 Other long term (current) drug therapy: Secondary | ICD-10-CM | POA: Diagnosis not present

## 2024-01-03 NOTE — Progress Notes (Signed)
   Subjective:    Patient ID: Victoria Hill, adult    DOB: 1985/07/09, 38 y.o.   MRN: [id number]  HPI  Patient is here for a testosterone  injection. Pt receives 100mg  every 7 days. Patient denies chest pain, shortness of breath, mood changes, headaches or palpitations.   Review of Systems     Objective:   Physical Exam        Assessment & Plan:   Injection given in LUOQ was tolerated  well by the patient. No redness or swelling noted at the injection site. Patient advised to RTC in 7 days around 01/10/24.

## 2024-01-09 NOTE — Progress Notes (Signed)
° °  Subjective:    Patient ID: Victoria Hill, adult    DOB: November 21, 1985, 38 y.o.   MRN: [id number]  HPI  Patient is here for a testosterone  injection. Pt receives 100mg  every 7 days. Patient denies chest pain, shortness of breath, mood changes, headaches or palpitations.     Review of Systems     Objective:   Physical Exam        Assessment & Plan:   Injection given in RUOQ was tolerated well by the patient. No redness or swelling noted at the injection site. Patient advised to RTC in 7 days around 01/17/24.

## 2024-01-10 ENCOUNTER — Ambulatory Visit (INDEPENDENT_AMBULATORY_CARE_PROVIDER_SITE_OTHER)

## 2024-01-10 DIAGNOSIS — F640 Transsexualism: Secondary | ICD-10-CM | POA: Diagnosis not present

## 2024-01-10 DIAGNOSIS — Z79899 Other long term (current) drug therapy: Secondary | ICD-10-CM | POA: Diagnosis not present

## 2024-01-10 NOTE — Progress Notes (Signed)
° °  Established Patient Office Visit  Subjective   Patient ID: Victoria Hill, adult    DOB: November 09, 1985  Age: 38 y.o. MRN: [id number]  Chief Complaint  Patient presents with   Transgender person on hormone therapy    Testosterone  injection nurse visit.     HPI  Transgender person on hormone therapy. Testosterone  injection nurse visit.  Last injection given 01/03/2024 . Last testosterone  lab work 03/14/2023- orders showing in chart for repeat lab work was placed on 09/11/2023.-patient informed of this and states he plans to return on Monday 01/21/2024 to have these drawn.   ROS    Objective:     BP 122/80   Pulse 80   Ht 5' (1.524 m)   SpO2 99%   BMI 39.84 kg/m    Physical Exam   No results found for any visits on 01/10/24.    The 10-year ASCVD risk score (Arnett DK, et al., 2019) is: 1.5%    Assessment & Plan:  Testosterone  admin 100mg  RUOQ IM . Patient tolerated injection well without complications.  Return in 7 days for next testosterone  injection as nurse visit. Patient will return on 01/21/2024 for lab work to be drawn as well.  Problem List Items Addressed This Visit       Other   Transgender person on hormone therapy - Primary    Return in about 7 years (around 01/10/2031) for testosterone  injection as nurse visit. SABRA Suzen SHAUNNA Alpheus, LPN

## 2024-01-10 NOTE — Patient Instructions (Signed)
 Return in 7 days for next Testosterone  injection as nurse visit.

## 2024-01-17 ENCOUNTER — Ambulatory Visit

## 2024-01-17 VITALS — BP 121/82 | HR 71 | Resp 18 | Ht 65.0 in | Wt 204.0 lb

## 2024-01-17 DIAGNOSIS — Z79899 Other long term (current) drug therapy: Secondary | ICD-10-CM

## 2024-01-17 DIAGNOSIS — F640 Transsexualism: Secondary | ICD-10-CM | POA: Diagnosis not present

## 2024-01-17 NOTE — Progress Notes (Signed)
° °  Subjective:    Patient ID: Victoria Hill, adult    DOB: 1985/06/23, 38 y.o.   MRN: [id number]  HPI  Patient is here for a testosterone  injection. Pt receives 100mg  every 7 days. Patient denies chest pain, shortness of breath, mood changes, headaches or palpitations.   Review of Systems     Objective:   Physical Exam        Assessment & Plan:   Injection given in RUOQ was tolerated  well by the patient. No redness or swelling noted at the injection site. Patient advised to RTC in 7 days around 01/25/24.

## 2024-01-21 ENCOUNTER — Ambulatory Visit

## 2024-01-21 VITALS — BP 133/81 | HR 75 | Resp 18 | Ht 65.0 in | Wt 204.0 lb

## 2024-01-21 DIAGNOSIS — F640 Transsexualism: Secondary | ICD-10-CM

## 2024-01-21 DIAGNOSIS — Z79899 Other long term (current) drug therapy: Secondary | ICD-10-CM

## 2024-01-21 NOTE — Progress Notes (Signed)
" ° °  Subjective:    Patient ID: Victoria Hill, adult    DOB: 11/29/1985, 38 y.o.   MRN: [id number]  HPI  Patient is here for a testosterone  injection. Pt receives 100mg  every 7 days. Patient denies chest pain, shortness of breath, mood changes, headaches or palpitations. Patient also mentioned getting his t levels checked. Labs drawn today.   Review of Systems      Objective:   Physical Exam        Assessment & Plan:   Injection given in LUOQ was tolerated  well by the patient. No redness or swelling noted at the injection site. Patient advised to RTC in 7 days around 01/28/24.  "

## 2024-01-22 ENCOUNTER — Ambulatory Visit: Payer: Self-pay | Admitting: Medical-Surgical

## 2024-01-22 LAB — COMPREHENSIVE METABOLIC PANEL WITH GFR
ALT: 29 IU/L (ref 0–32)
AST: 17 IU/L (ref 0–40)
Albumin: 4.4 g/dL (ref 3.9–4.9)
Alkaline Phosphatase: 60 IU/L (ref 41–116)
BUN/Creatinine Ratio: 9 (ref 9–23)
BUN: 10 mg/dL (ref 6–24)
Bilirubin Total: 1.4 mg/dL — ABNORMAL HIGH (ref 0.0–1.2)
CO2: 23 mmol/L (ref 20–29)
Calcium: 9.6 mg/dL (ref 8.7–10.2)
Chloride: 102 mmol/L (ref 96–106)
Creatinine, Ser: 1.06 mg/dL — ABNORMAL HIGH (ref 0.57–1.00)
Globulin, Total: 2.4 g/dL (ref 1.5–4.5)
Glucose: 87 mg/dL (ref 70–99)
Potassium: 3.6 mmol/L (ref 3.5–5.2)
Sodium: 140 mmol/L (ref 134–144)
Total Protein: 6.8 g/dL (ref 6.0–8.5)
eGFR: 68 mL/min/1.73

## 2024-01-22 LAB — CBC
Hematocrit: 49.8 % — ABNORMAL HIGH (ref 34.0–46.6)
Hemoglobin: 16.5 g/dL — ABNORMAL HIGH (ref 11.1–15.9)
MCH: 30.1 pg (ref 26.6–33.0)
MCHC: 33.1 g/dL (ref 31.5–35.7)
MCV: 91 fL (ref 79–97)
Platelets: 333 x10E3/uL (ref 150–450)
RBC: 5.49 x10E6/uL — ABNORMAL HIGH (ref 3.77–5.28)
RDW: 13.5 % (ref 11.7–15.4)
WBC: 7.9 x10E3/uL (ref 3.4–10.8)

## 2024-01-22 LAB — TESTOSTERONE: Testosterone: 779 ng/dL — ABNORMAL HIGH (ref 8–60)

## 2024-02-01 ENCOUNTER — Ambulatory Visit (INDEPENDENT_AMBULATORY_CARE_PROVIDER_SITE_OTHER)

## 2024-02-01 VITALS — BP 140/86 | HR 79 | Ht 65.0 in

## 2024-02-01 DIAGNOSIS — Z79899 Other long term (current) drug therapy: Secondary | ICD-10-CM | POA: Diagnosis not present

## 2024-02-01 DIAGNOSIS — F640 Transsexualism: Secondary | ICD-10-CM

## 2024-02-01 NOTE — Progress Notes (Signed)
" ° °  Established Patient Office Visit  Subjective   Patient ID: Victoria Hill, adult    DOB: 25-May-1985  Age: 39 y.o. MRN: [id number]  Chief Complaint  Patient presents with   Transgender person on hormone therapy    Testosterone  injection - nurse visit.     HPI Transgender person on hormone therapy- testosterone  injection - nurse visit. Patient denies chest pain, shortness of breath, dizziness, palpitations.  Mood changes or medication problems  ROS    Objective:     BP (!) 140/86   Pulse 79   Ht 5' 5 (1.651 m)   SpO2 99%   BMI 33.95 kg/m    Physical Exam   No results found for any visits on 02/01/24.    The 10-year ASCVD risk score (Arnett DK, et al., 2019) is: 1.9%    Assessment & Plan:  Testosterone  100mg  admin IM RUOQ . Patient tolerated injection well without complications.  Return in 7 days for next testosterone  injection as nurse visit.  Problem List Items Addressed This Visit       Other   Transgender person on hormone therapy - Primary    Return in about 1 week (around 02/08/2024) for testosterone  injection nurse visit.SABRA Suzen SHAUNNA Alpheus, LPN  "

## 2024-02-01 NOTE — Progress Notes (Signed)
" ° °  Subjective:    Patient ID: Victoria Hill, adult    DOB: 09/04/1985, 39 y.o.   MRN: [id number]  HPI  Patient is here for a testosterone  injection. Pt receives 100mg  every 7 days. Patient denies chest pain, shortness of breath, mood changes, headaches or palpitations.   Review of Systems     Objective:   Physical Exam        Assessment & Plan:   Injection given in RUOQ was tolerated  well by the patient. No redness or swelling noted at the injection site. RTC around 02/08/24 for next tshot. "

## 2024-02-01 NOTE — Patient Instructions (Signed)
 Return in 7 days for next injection as nurse visit.

## 2024-02-07 ENCOUNTER — Ambulatory Visit (INDEPENDENT_AMBULATORY_CARE_PROVIDER_SITE_OTHER)

## 2024-02-07 VITALS — BP 126/83 | HR 76 | Resp 20 | Ht 65.0 in | Wt 204.0 lb

## 2024-02-07 DIAGNOSIS — Z79899 Other long term (current) drug therapy: Secondary | ICD-10-CM | POA: Diagnosis not present

## 2024-02-07 DIAGNOSIS — F640 Transsexualism: Secondary | ICD-10-CM

## 2024-02-07 NOTE — Progress Notes (Signed)
" ° °  Subjective:    Patient ID: Victoria Hill, adult    DOB: 1985-05-15, 39 y.o.   MRN: [id number]  HPI   Patient is here for a testosterone  injection. Pt receives 100mg  every 7 days. Patient denies chest pain, shortness of breath, mood changes, headaches or palpitations   Review of Systems     Objective:   Physical Exam        Assessment & Plan:   Injection given in LUOQ was tolerated  well by the patient. No redness or swelling noted at the injection site. Patient advised to RTC in 7 days around 02/14/24 .  "

## 2024-02-13 NOTE — Progress Notes (Signed)
" ° °  Subjective:    Patient ID: Victoria Hill, adult    DOB: [birth year], 39 y.o.   MRN: [id number]  HPI  Patient is here for a testosterone  injection. Pt receives 100mg  every 7 days. Patient denies chest pain, shortness of breath, mood changes, headaches or palpitations   Review of Systems     Objective:   Physical Exam        Assessment & Plan:   Injection given in RUOQ was tolerated  well by the patient. No redness or swelling noted at the injection site. Patient advised to RTC in 7 days around 02/21/24 .  "

## 2024-02-14 ENCOUNTER — Ambulatory Visit (INDEPENDENT_AMBULATORY_CARE_PROVIDER_SITE_OTHER)

## 2024-02-14 VITALS — BP 139/73 | HR 83 | Resp 17 | Ht 65.0 in

## 2024-02-14 DIAGNOSIS — Z79899 Other long term (current) drug therapy: Secondary | ICD-10-CM

## 2024-02-14 DIAGNOSIS — F640 Transsexualism: Secondary | ICD-10-CM | POA: Diagnosis not present

## 2024-02-20 NOTE — Progress Notes (Unsigned)
" ° °  Subjective:    Patient ID: Victoria Hill, adult    DOB: 07/28/1985, 39 y.o.   MRN: [id number]  HPI  Patient is here for a testosterone  injection. Pt receives 100mg  every 7 days. Patient denies chest pain, shortness of breath, mood changes, headaches or palpitations   Review of Systems     Objective:   Physical Exam        Assessment & Plan:   Injection given in LUOQ was tolerated  well by the patient. No redness or swelling noted at the injection site. Patient advised to RTC in 7 days around 02/28/24 .  "

## 2024-02-21 ENCOUNTER — Ambulatory Visit

## 2024-02-21 VITALS — BP 123/82 | HR 80 | Resp 19 | Ht 65.0 in

## 2024-02-21 DIAGNOSIS — Z79899 Other long term (current) drug therapy: Secondary | ICD-10-CM

## 2024-02-21 DIAGNOSIS — F640 Transsexualism: Secondary | ICD-10-CM

## 2024-02-27 NOTE — Progress Notes (Unsigned)
" ° °  Subjective:    Patient ID: Victoria Hill, adult    DOB: [birth year], 39 y.o.   MRN: [id number]  HPI  Patient is here for a testosterone  injection. Pt receives 100 mg every 7 days. Patient denies chest pain, shortness of breath, mood changes, headaches or palpitations.  Review of Systems     Objective:   Physical Exam        Assessment & Plan:   Injection given in RUOQ was tolerated  well by the patient. No redness or swelling noted at the injection site. Patient advised to RTC in 7 days around 03/06/24 .  "

## 2024-02-28 ENCOUNTER — Ambulatory Visit

## 2024-02-28 VITALS — BP 139/82 | HR 81 | Resp 16 | Ht 65.0 in

## 2024-02-28 DIAGNOSIS — F640 Transsexualism: Secondary | ICD-10-CM

## 2024-02-28 DIAGNOSIS — Z79899 Other long term (current) drug therapy: Secondary | ICD-10-CM

## 2024-03-06 ENCOUNTER — Ambulatory Visit (INDEPENDENT_AMBULATORY_CARE_PROVIDER_SITE_OTHER)

## 2024-03-06 VITALS — BP 129/78 | HR 77 | Resp 17 | Ht 65.0 in

## 2024-03-06 DIAGNOSIS — Z79899 Other long term (current) drug therapy: Secondary | ICD-10-CM

## 2024-03-06 DIAGNOSIS — F640 Transsexualism: Secondary | ICD-10-CM | POA: Diagnosis not present

## 2024-03-06 NOTE — Progress Notes (Signed)
" ° °  Subjective:    Patient ID: Victoria Hill, adult    DOB: [birth year], 39 y.o.   MRN: [id number]  HPI  Patient is here for a testosterone  injection. Pt receives 100 mg every 7 days. Patient denies chest pain, shortness of breath, mood changes, headaches or palpitations.   Review of Systems     Objective:   Physical Exam        Assessment & Plan:   Injection given in RUOQ was tolerated  well by the patient. No redness or swelling noted at the injection site. Patient advised to RTC in 7 days around 03/13/24 .  "

## 2024-03-13 ENCOUNTER — Ambulatory Visit: Admitting: Medical-Surgical

## 2024-03-20 ENCOUNTER — Ambulatory Visit

## 2024-03-27 ENCOUNTER — Ambulatory Visit
